# Patient Record
Sex: Male | Born: 1970 | Race: Black or African American | Hispanic: No | Marital: Single | Smoking: Never smoker
Health system: Southern US, Community
[De-identification: ages and names within clinical notes are randomized; demographics above are authoritative.]

## PROBLEM LIST (undated history)

## (undated) DIAGNOSIS — I639 Cerebral infarction, unspecified: Secondary | ICD-10-CM

## (undated) DIAGNOSIS — R42 Dizziness and giddiness: Secondary | ICD-10-CM

## (undated) DIAGNOSIS — F32A Depression, unspecified: Secondary | ICD-10-CM

## (undated) HISTORY — DX: Cerebral infarction, unspecified: I63.9

---

## 2000-11-30 HISTORY — PX: VASECTOMY: SHX75

## 2019-09-14 ENCOUNTER — Other Ambulatory Visit: Payer: Self-pay

## 2019-09-14 DIAGNOSIS — Z20822 Contact with and (suspected) exposure to covid-19: Secondary | ICD-10-CM

## 2019-09-15 LAB — NOVEL CORONAVIRUS, NAA: SARS-CoV-2, NAA: NOT DETECTED

## 2019-09-18 ENCOUNTER — Telehealth: Payer: Self-pay | Admitting: General Practice

## 2019-09-18 NOTE — Telephone Encounter (Signed)
Negative COVID results given. Patient results "NOT Detected." Caller expressed understanding. ° °

## 2021-01-28 DIAGNOSIS — I2699 Other pulmonary embolism without acute cor pulmonale: Secondary | ICD-10-CM

## 2021-01-28 HISTORY — DX: Other pulmonary embolism without acute cor pulmonale: I26.99

## 2021-02-12 ENCOUNTER — Other Ambulatory Visit: Payer: Self-pay

## 2021-02-12 ENCOUNTER — Inpatient Hospital Stay (HOSPITAL_COMMUNITY)
Admission: EM | Admit: 2021-02-12 | Discharge: 2021-02-15 | DRG: 208 | Disposition: A | Discharge status: Home / Self Care | Payer: 59 | Attending: Pulmonary Disease | Admitting: Pulmonary Disease

## 2021-02-12 ENCOUNTER — Emergency Department (HOSPITAL_COMMUNITY): Payer: 59

## 2021-02-12 ENCOUNTER — Encounter (HOSPITAL_COMMUNITY): Payer: Self-pay

## 2021-02-12 DIAGNOSIS — I82442 Acute embolism and thrombosis of left tibial vein: Secondary | ICD-10-CM | POA: Diagnosis present

## 2021-02-12 DIAGNOSIS — R296 Repeated falls: Secondary | ICD-10-CM | POA: Diagnosis present

## 2021-02-12 DIAGNOSIS — I614 Nontraumatic intracerebral hemorrhage in cerebellum: Secondary | ICD-10-CM | POA: Diagnosis present

## 2021-02-12 DIAGNOSIS — S0003XA Contusion of scalp, initial encounter: Secondary | ICD-10-CM | POA: Diagnosis present

## 2021-02-12 DIAGNOSIS — R52 Pain, unspecified: Secondary | ICD-10-CM

## 2021-02-12 DIAGNOSIS — D696 Thrombocytopenia, unspecified: Secondary | ICD-10-CM | POA: Diagnosis present

## 2021-02-12 DIAGNOSIS — I2609 Other pulmonary embolism with acute cor pulmonale: Secondary | ICD-10-CM

## 2021-02-12 DIAGNOSIS — R55 Syncope and collapse: Secondary | ICD-10-CM | POA: Diagnosis not present

## 2021-02-12 DIAGNOSIS — Z20822 Contact with and (suspected) exposure to covid-19: Secondary | ICD-10-CM | POA: Diagnosis present

## 2021-02-12 DIAGNOSIS — M79605 Pain in left leg: Secondary | ICD-10-CM | POA: Diagnosis present

## 2021-02-12 DIAGNOSIS — I82621 Acute embolism and thrombosis of deep veins of right upper extremity: Secondary | ICD-10-CM | POA: Diagnosis present

## 2021-02-12 DIAGNOSIS — F10239 Alcohol dependence with withdrawal, unspecified: Secondary | ICD-10-CM | POA: Diagnosis not present

## 2021-02-12 DIAGNOSIS — F10229 Alcohol dependence with intoxication, unspecified: Secondary | ICD-10-CM | POA: Diagnosis present

## 2021-02-12 DIAGNOSIS — Z6832 Body mass index (BMI) 32.0-32.9, adult: Secondary | ICD-10-CM

## 2021-02-12 DIAGNOSIS — F4310 Post-traumatic stress disorder, unspecified: Secondary | ICD-10-CM | POA: Diagnosis present

## 2021-02-12 DIAGNOSIS — N62 Hypertrophy of breast: Secondary | ICD-10-CM | POA: Diagnosis present

## 2021-02-12 DIAGNOSIS — G9340 Encephalopathy, unspecified: Secondary | ICD-10-CM | POA: Diagnosis not present

## 2021-02-12 DIAGNOSIS — F17220 Nicotine dependence, chewing tobacco, uncomplicated: Secondary | ICD-10-CM | POA: Diagnosis present

## 2021-02-12 DIAGNOSIS — E876 Hypokalemia: Secondary | ICD-10-CM | POA: Diagnosis not present

## 2021-02-12 DIAGNOSIS — G40901 Epilepsy, unspecified, not intractable, with status epilepticus: Secondary | ICD-10-CM | POA: Diagnosis present

## 2021-02-12 DIAGNOSIS — R0902 Hypoxemia: Secondary | ICD-10-CM | POA: Diagnosis present

## 2021-02-12 DIAGNOSIS — K5900 Constipation, unspecified: Secondary | ICD-10-CM | POA: Diagnosis not present

## 2021-02-12 DIAGNOSIS — X58XXXA Exposure to other specified factors, initial encounter: Secondary | ICD-10-CM | POA: Diagnosis present

## 2021-02-12 DIAGNOSIS — I82412 Acute embolism and thrombosis of left femoral vein: Secondary | ICD-10-CM | POA: Diagnosis present

## 2021-02-12 DIAGNOSIS — J9601 Acute respiratory failure with hypoxia: Secondary | ICD-10-CM | POA: Diagnosis present

## 2021-02-12 DIAGNOSIS — F1010 Alcohol abuse, uncomplicated: Secondary | ICD-10-CM | POA: Diagnosis present

## 2021-02-12 DIAGNOSIS — E872 Acidosis: Secondary | ICD-10-CM | POA: Diagnosis present

## 2021-02-12 DIAGNOSIS — R410 Disorientation, unspecified: Secondary | ICD-10-CM | POA: Diagnosis not present

## 2021-02-12 DIAGNOSIS — F32A Depression, unspecified: Secondary | ICD-10-CM | POA: Diagnosis present

## 2021-02-12 DIAGNOSIS — I959 Hypotension, unspecified: Secondary | ICD-10-CM | POA: Diagnosis present

## 2021-02-12 DIAGNOSIS — I2699 Other pulmonary embolism without acute cor pulmonale: Secondary | ICD-10-CM | POA: Diagnosis present

## 2021-02-12 DIAGNOSIS — J9602 Acute respiratory failure with hypercapnia: Secondary | ICD-10-CM | POA: Diagnosis present

## 2021-02-12 HISTORY — DX: Depression, unspecified: F32.A

## 2021-02-12 LAB — BLOOD GAS, VENOUS
Acid-base deficit: 2.6 mmol/L — ABNORMAL HIGH (ref 0.0–2.0)
Bicarbonate: 23.6 mmol/L (ref 20.0–28.0)
O2 Saturation: 47.3 %
Patient temperature: 98.6
pCO2, Ven: 47.7 mmHg (ref 44.0–60.0)
pH, Ven: 7.316 (ref 7.250–7.430)
pO2, Ven: 33.2 mmHg (ref 32.0–45.0)

## 2021-02-12 LAB — BASIC METABOLIC PANEL
Anion gap: 17 — ABNORMAL HIGH (ref 5–15)
BUN: 11 mg/dL (ref 6–20)
CO2: 20 mmol/L — ABNORMAL LOW (ref 22–32)
Calcium: 8.3 mg/dL — ABNORMAL LOW (ref 8.9–10.3)
Chloride: 101 mmol/L (ref 98–111)
Creatinine, Ser: 1.32 mg/dL — ABNORMAL HIGH (ref 0.61–1.24)
GFR, Estimated: >60 mL/min (ref >60)
Glucose, Bld: 86 mg/dL (ref 70–99)
Potassium: 3.3 mmol/L — ABNORMAL LOW (ref 3.5–5.1)
Sodium: 138 mmol/L (ref 135–145)

## 2021-02-12 LAB — CBC
HCT: 51.2 % (ref 39.0–52.0)
Hemoglobin: 16.8 g/dL (ref 13.0–17.0)
MCH: 33.5 pg (ref 26.0–34.0)
MCHC: 32.8 g/dL (ref 30.0–36.0)
MCV: 102 fL — ABNORMAL HIGH (ref 80.0–100.0)
Platelets: 133 10*3/uL — ABNORMAL LOW (ref 150–400)
RBC: 5.02 MIL/uL (ref 4.22–5.81)
RDW: 15.1 % (ref 11.5–15.5)
WBC: 12.4 10*3/uL — ABNORMAL HIGH (ref 4.0–10.5)
nRBC: 0 % (ref 0.0–0.2)

## 2021-02-12 LAB — TSH: TSH: 2.88 u[IU]/mL (ref 0.350–4.500)

## 2021-02-12 LAB — RESP PANEL BY RT-PCR (FLU A&B, COVID) ARPGX2
Influenza A by PCR: NEGATIVE
Influenza B by PCR: NEGATIVE
SARS Coronavirus 2 by RT PCR: NEGATIVE

## 2021-02-12 LAB — LIPASE, BLOOD: Lipase: 26 U/L (ref 11–51)

## 2021-02-12 LAB — HEPATIC FUNCTION PANEL
ALT: 25 U/L (ref 0–44)
AST: 80 U/L — ABNORMAL HIGH (ref 15–41)
Albumin: 2.2 g/dL — ABNORMAL LOW (ref 3.5–5.0)
Alkaline Phosphatase: 26 U/L — ABNORMAL LOW (ref 38–126)
Bilirubin, Direct: 0.5 mg/dL — ABNORMAL HIGH (ref 0.0–0.2)
Indirect Bilirubin: 0.6 mg/dL (ref 0.3–0.9)
Total Bilirubin: 1.1 mg/dL (ref 0.3–1.2)
Total Protein: 5 g/dL — ABNORMAL LOW (ref 6.5–8.1)

## 2021-02-12 LAB — MAGNESIUM: Magnesium: 1.5 mg/dL — ABNORMAL LOW (ref 1.7–2.4)

## 2021-02-12 LAB — LACTIC ACID, PLASMA: Lactic Acid, Venous: 5.6 mmol/L (ref 0.5–1.9)

## 2021-02-12 LAB — CBG MONITORING, ED: Glucose-Capillary: 76 mg/dL (ref 70–99)

## 2021-02-12 LAB — TROPONIN I (HIGH SENSITIVITY): Troponin I (High Sensitivity): 108 ng/L (ref <18)

## 2021-02-12 LAB — ETHANOL: Alcohol, Ethyl (B): 54 mg/dL — ABNORMAL HIGH (ref <10)

## 2021-02-12 MED ORDER — HEPARIN (PORCINE) 25000 UT/250ML-% IV SOLN
1600.0000 [IU]/h | INTRAVENOUS | Status: DC
  Administered 2021-02-13: 1600 [IU]/h via INTRAVENOUS
  Filled 2021-02-12: qty 250

## 2021-02-12 MED ORDER — HEPARIN BOLUS VIA INFUSION
2500.0000 [IU] | Freq: Once | INTRAVENOUS | Status: AC
  Administered 2021-02-13: 2500 [IU] via INTRAVENOUS
  Filled 2021-02-12: qty 2500

## 2021-02-12 MED ORDER — LACTATED RINGERS IV BOLUS
1000.0000 mL | Freq: Once | INTRAVENOUS | Status: AC
  Administered 2021-02-12: 1000 mL via INTRAVENOUS

## 2021-02-12 MED ORDER — IOHEXOL 350 MG/ML SOLN
75.0000 mL | Freq: Once | INTRAVENOUS | Status: AC | PRN
  Administered 2021-02-12: 75 mL via INTRAVENOUS

## 2021-02-12 MED ORDER — LACTATED RINGERS IV BOLUS
1000.0000 mL | Freq: Once | INTRAVENOUS | Status: AC
  Administered 2021-02-13: 1000 mL via INTRAVENOUS

## 2021-02-12 NOTE — Progress Notes (Signed)
ANTICOAGULATION CONSULT NOTE - Initial Consult  Pharmacy Consult for Heparin Indication: pulmonary embolus  No Known Allergies  Patient Measurements: Height: 5\' 9"  (175.3 cm) Weight: 97.5 kg (215 lb) IBW/kg (Calculated) : 70.7 HEPARIN DW (KG): 91.1   Vital Signs: Temp: 97.9 F (36.6 C) (03/16 2149) Temp Source: Oral (03/16 2149) BP: 93/66 (03/16 2245) Pulse Rate: 119 (03/16 2245)  Labs: Recent Labs    02/12/21 2149 02/12/21 2200 02/12/21 2230  HGB 16.8  --   --   HCT 51.2  --   --   PLT 133*  --   --   CREATININE  --   --  1.32*  TROPONINIHS  --  108*  --     Estimated Creatinine Clearance: 77.9 mL/min (A) (by C-G formula based on SCr of 1.32 mg/dL (H)).   Medical History: Past Medical History:  Diagnosis Date  . Depression     Medications:  Infusions:  . lactated ringers      Assessment: 50 yo M who presents after syncopal episodes.  No hx VTE and not on anticoagulation PTA.  CTa + PE with evidence of right heart strain. CBC: Hg WNL, Pltc slightly low at 133  Goal of Therapy:  Heparin level 0.3-0.7 units/ml Monitor platelets by anticoagulation protocol: Yes   Plan:  Heparin 2500 units IV bolus x1 followed by continuous infusion at 1600 units/hr Check 6h heparin level after infusion starts Daily heparin level & CBC while on heparin Monitor closely for bleeding  54 PharmD 02/12/2021,11:43 PM

## 2021-02-12 NOTE — ED Triage Notes (Addendum)
Patient had a syncope episode for call by son to EMS in a five minute time span patient called ago that patient was having seizure. Patient was in the bathroom washing his hair. Tremors on baseline. Patient was 84/58 and then gave fluids. Last pressure 124/88 hr-110. Patient put on nonbreather due to 84 O2 Sat.

## 2021-02-12 NOTE — H&P (Signed)
NAME:  Danny Lowe, MRN:  [id number], DOB:  1971/01/02, LOS: 0 ADMISSION DATE:  02/12/2021, CONSULTATION DATE:  02/13/21 REFERRING MD:  Tegeler, CHIEF COMPLAINT:  syncope  Brief History   49yM with borderline massive PE s/p 50 mg tpa through PIV 02/13/21  History of present illness   49yM with alcohol use disorder (2 40oz beers or 12 pack a day - no reported history of severe withdrawal symptoms), PTSD, depression who presents to ED with 3 syncopal episodes earlier the day of admission. He had first noticed LLE swelling in 09/2020 when he had trouble getting a pair of pants on but this has worsened over the last week and has had some pain at that site as well. Today had 3 syncopal episodes and during at least one of them hit his face on the floor. Son found him and observed some shaking movements. He was found to be hypotesnive with SBP in 80s, SpO2 in 80s by EMS. He was given 1L IVF.  In the ED he mentions that he'd recently had some nausea and diarrhea over the last week. He was started on heparin gtt, given an additional 1L IVF.   Past Medical History  Alcohol use disorder PTSD Depression  Significant Hospital Events   02/13/21 50 mg tpa infused  Consults:  PCCM  Procedures:  None  Significant Diagnostic Tests:  CTA Chest   Micro Data:  covid-19 neg  Antimicrobials:  None  Interim history/subjective:  n/a  Objective   Blood pressure 93/66, pulse (!) 119, temperature 97.9 F (36.6 C), temperature source Oral, resp. rate (!) 30, height 5\' 9"  (1.753 m), weight 97.5 kg, SpO2 92 %.       No intake or output data in the 24 hours ending 02/12/21 2351 Filed Weights   02/12/21 2153  Weight: 97.5 kg    Examination: General: alert/oriented x3 HENT: NCAT, dry MM Lungs: CTAB, normal work of breathing Cardiovascular: RRR, no murmur, no JVD  Abdomen: soft, nontender, normal bowel sounds Extremities: warm, well-perfused without cyanosis, edema Neuro: grossly nonfocal,  follows commands  Resolved Hospital Problem list   n/a  Assessment & Plan:   # Massive PE: borderline massive vs high risk/submassive PE. His history raised some concern for component of hypovolemia contributing to his hypotension however on my bedside 2154 his IVC was distended and RV enlarged with systolic and diastolic septal flattening and there was less BP response to second liter of fluid. - after discussion of risks/benefits of peripheral infusion of reduced dose tpa (as used in MOPETT, Wang 2010) vs catheter directed thrombolysis, including the possibility of a slightly higher bleeding risk with even reduced dose tpa than catheter directed lytics, pt elects to proceed with peripheral tpa, preferring to avoid procedures if possible.  - 10 mg bolus of tpa followed by 40 mg over 2 hours given in ED. Reduced dose in setting of mild thrombocytopenia and head trauma causing scalp contusion - resume heparin gtt when ptt <1.5x normal without bolus  - TTE  - 2011 DVT ordered  # Lactic acidosis: - likely driven by sympathetic activation in setting PE vs possibly hypoperfusion related to PE - trend  # Alcohol use disorder:  - ciwa ok for now since there is no history of severe withdrawal manifestations - thiamine   Best practice:  Diet: NPO for now Pain/Anxiety/Delirium protocol (if indicated): ciwa VAP protocol (if indicated): no DVT prophylaxis: no GI prophylaxis: no Glucose control: checking with chemistries Mobility: restricted to bed Code  Status: full Family Communication: pt updated at bedside Disposition: [facility] ICU  Labs   CBC: Recent Labs  Lab 02/12/21 2149  WBC 12.4*  HGB 16.8  HCT 51.2  MCV 102.0*  PLT 133*    Basic Metabolic Panel: Recent Labs  Lab 02/12/21 2200 02/12/21 2230  NA  --  138  K  --  3.3*  CL  --  101  CO2  --  20*  GLUCOSE  --  86  BUN  --  11  CREATININE  --  1.32*  CALCIUM  --  8.3*  MG 1.5*  --    GFR: Estimated Creatinine Clearance:  77.9 mL/min (A) (by C-G formula based on SCr of 1.32 mg/dL (H)). Recent Labs  Lab 02/12/21 2149 02/12/21 2223  WBC 12.4*  --   LATICACIDVEN  --  5.6*    Liver Function Tests: Recent Labs  Lab 02/12/21 2200  AST 80*  ALT 25  ALKPHOS 26*  BILITOT 1.1  PROT 5.0*  ALBUMIN 2.2*   Recent Labs  Lab 02/12/21 2200  LIPASE 26   No results for input(s): AMMONIA in the last 168 hours.  ABG    Component Value Date/Time   HCO3 23.6 02/12/2021 2223   ACIDBASEDEF 2.6 (H) 02/12/2021 2223   O2SAT 47.3 02/12/2021 2223     Coagulation Profile: No results for input(s): INR, PROTIME in the last 168 hours.  Cardiac Enzymes: No results for input(s): CKTOTAL, CKMB, CKMBINDEX, TROPONINI in the last 168 hours.  HbA1C: No results found for: HGBA1C  CBG: Recent Labs  Lab 02/12/21 2150  GLUCAP 76    Review of Systems:   A twelve point review of systems is negative except as otherwise noted in HPI  Past Medical History  He,  has a past medical history of Depression.   Surgical History   History reviewed. No pertinent surgical history.   Social History   reports that he has never smoked. His smokeless tobacco use includes chew. He reports current alcohol use. He reports that he does not use drugs.   Family History   His family history is not on file.   Allergies No Known Allergies   Home Medications  Prior to Admission medications   Not on File     Critical care time: 45 minutes    This patient is critically ill with massive PE; which, requires frequent high complexity decision making, assessment, support, evaluation, and titration of therapies. This was completed through the application of advanced monitoring technologies and extensive interpretation of multiple databases. During this encounter critical care time was devoted to patient care services described in this note for 45 minutes.  Vida Roller, Pulmonary/Critical Care

## 2021-02-12 NOTE — Progress Notes (Incomplete)
ANTICOAGULATION CONSULT NOTE - Initial Consult  Pharmacy Consult for Heparin Indication: pulmonary embolus  No Known Allergies  Patient Measurements: Height: 5\' 9"  (175.3 cm) Weight: 97.5 kg (215 lb) IBW/kg (Calculated) : 70.7 HEPARIN DW (KG): 91.1   Vital Signs: Temp: 97.9 F (36.6 C) (03/16 2149) Temp Source: Oral (03/16 2149) BP: 93/66 (03/16 2245) Pulse Rate: 119 (03/16 2245)  Labs: Recent Labs    02/12/21 2149 02/12/21 2200 02/12/21 2230  HGB 16.8  --   --   HCT 51.2  --   --   PLT 133*  --   --   CREATININE  --   --  1.32*  TROPONINIHS  --  108*  --     Estimated Creatinine Clearance: 77.9 mL/min (A) (by C-G formula based on SCr of 1.32 mg/dL (H)).   Medical History: Past Medical History:  Diagnosis Date  . Depression     Medications:  Infusions:  . lactated ringers      Assessment: 50 yo M who presents after syncopal episodes.  No hx VTE and not on anticoagulation PTA.  CTa + PE CBC: Hg WNL, Pltc slightly low at 133  Goal of Therapy:  Heparin level 0.3-0.7 units/ml Monitor platelets by anticoagulation protocol: Yes   Plan:  Heparin 2500 units IV bolus x1 followed by continuous infusion at 1600 units/hr Check 6h heparin level after infusion starts Daily heparin level & CBC while on heparin Monitor closely for bleeding  50 PharmD 02/12/2021,11:43 PM

## 2021-02-12 NOTE — ED Provider Notes (Addendum)
[facility]-EMERGENCY DEPT Provider Note   CSN: [id number] Arrival date & time: 02/12/21  2116     History Chief Complaint  Patient presents with  . Near Syncope    Danny Lowe is a 50 y.o. male with history of depression and PTSD who presents for 3 syncopal episodes this evening. He states that he stood up and walked 3 steps when he felt lightheaded and passed out, hitting his face on the floor. States his son told him he had shaking movements. Denies any history of seizures or of passing out before tonight. He woke and got up but then had another syncopal episode. His son called EMS who came and checked orthostatics, they recommended transport to the hospital but they refused. After EMS left he had a third syncopal episode and they called EMS again, after which they agreed to transport. EMS reports he had BP with systolic in the 80s and also O2 saturation in the 80s. They administered 1L of IV fluids, and patient states he has felt much better since then.  Patient reports that over the past week, he has had swelling and pain in the L leg. This has improved somewhat over time. No known history of DVT or PE. He has also had intermittent SOB, chest tigthness, and cough productive of small amounts of blood. No history of smoking. He has had heavy alcohol use in the past several months and notes he is going through a separation with his significant other which is causing depressed mood. Reports typically having 12 beers a day. He has had 2x 40oz beers today with the last drink at 1930. Also notes poor PO intake of both food and fluid, attributes this to depression.    Past Medical History:  Diagnosis Date  . Depression    There are no problems to display for this patient.  History reviewed. No pertinent surgical history.   History reviewed. No pertinent family history.  Social History   Tobacco Use  . Smoking status: Never Smoker  . Smokeless tobacco: Current  User    Types: Chew  Substance Use Topics  . Alcohol use: Yes    Comment: 12 beers daily  . Drug use: Never    Home Medications Prior to Admission medications   Not on File  Trazadone Not taking prescribed sertraline  Allergies    Patient has no known allergies.  Review of Systems   Review of Systems  Constitutional: Positive for appetite change (decreased food and fluid intake). Negative for chills and fever.  HENT: Negative for congestion and sore throat.   Eyes: Negative for pain and visual disturbance.  Respiratory: Positive for cough (productive of specks of blood), chest tightness and shortness of breath.   Cardiovascular: Positive for leg swelling. Negative for palpitations.  Gastrointestinal: Positive for nausea and vomiting (dry heave x1). Negative for abdominal pain.  Genitourinary: Negative for dysuria and frequency.  Musculoskeletal: Negative for arthralgias and back pain.  Skin: Negative for rash and wound.  Neurological: Positive for syncope, light-headedness and numbness (numbness of his L thumb and index finger when he does not eat). Negative for weakness and headaches.  Psychiatric/Behavioral: Positive for dysphoric mood and sleep disturbance.  All other systems reviewed and are negative.   Physical Exam Updated Vital Signs BP 93/66   Pulse (!) 119   Temp 97.9 F (36.6 C) (Oral)   Resp (!) 30   Ht 5\' 9"  (1.753 m)   Wt 97.5 kg   SpO2 92%  BMI 31.75 kg/m   Physical Exam Vitals and nursing note reviewed.  Constitutional:      General: He is not in acute distress.    Comments: Appears mildly fatigued  HENT:     Head: Normocephalic and atraumatic.     Right Ear: External ear normal.     Left Ear: External ear normal.     Nose: Nose normal.     Mouth/Throat:     Mouth: Mucous membranes are moist.     Pharynx: Oropharynx is clear.     Comments: Abrasion and swelling to upper lip Eyes:     Extraocular Movements: Extraocular movements intact.      Pupils: Pupils are equal, round, and reactive to light.  Cardiovascular:     Rate and Rhythm: Regular rhythm. Tachycardia present.     Heart sounds: No murmur heard. No friction rub. No gallop.      Comments: Extremities are cool to the touch 2+ radial and DP pulses bilaterally LLE with 1+ pitting edema to the ankle  Negative Homan's sign, no calf tenderness Moderate JVD  Pulmonary:     Effort: Pulmonary effort is normal. No respiratory distress.     Breath sounds: Normal breath sounds. No stridor. No wheezing or rhonchi.  Abdominal:     General: Abdomen is flat. There is no distension.     Palpations: Abdomen is soft.     Tenderness: There is no abdominal tenderness.  Musculoskeletal:        General: No deformity or signs of injury.     Cervical back: Normal range of motion and neck supple.     Right lower leg: No edema.     Left lower leg: Edema present.  Skin:    General: Skin is dry.  Neurological:     General: No focal deficit present.     Mental Status: He is alert and oriented to person, place, and time.  Psychiatric:        Mood and Affect: Mood normal.        Behavior: Behavior normal.     ED Results / Procedures / Treatments   Labs (all labs ordered are listed, but only abnormal results are displayed) Labs Reviewed  CBC - Abnormal; Notable for the following components:      Result Value   WBC 12.4 (*)    MCV 102.0 (*)    Platelets 133 (*)    All other components within normal limits  ETHANOL - Abnormal; Notable for the following components:   Alcohol, Ethyl (B) 54 (*)    All other components within normal limits  HEPATIC FUNCTION PANEL - Abnormal; Notable for the following components:   Total Protein 5.0 (*)    Albumin 2.2 (*)    AST 80 (*)    Alkaline Phosphatase 26 (*)    Bilirubin, Direct 0.5 (*)    All other components within normal limits  LACTIC ACID, PLASMA - Abnormal; Notable for the following components:   Lactic Acid, Venous 5.6 (*)    All  other components within normal limits  BLOOD GAS, VENOUS - Abnormal; Notable for the following components:   Acid-base deficit 2.6 (*)    All other components within normal limits  MAGNESIUM - Abnormal; Notable for the following components:   Magnesium 1.5 (*)    All other components within normal limits  BASIC METABOLIC PANEL - Abnormal; Notable for the following components:   Potassium 3.3 (*)    CO2 20 (*)  Creatinine, Ser 1.32 (*)    Calcium 8.3 (*)    Anion gap 17 (*)    All other components within normal limits  TROPONIN I (HIGH SENSITIVITY) - Abnormal; Notable for the following components:   Troponin I (High Sensitivity) 108 (*)    All other components within normal limits  RESP PANEL BY RT-PCR (FLU A&B, COVID) ARPGX2  LIPASE, BLOOD  TSH  URINALYSIS, ROUTINE W REFLEX MICROSCOPIC  BRAIN NATRIURETIC PEPTIDE  RAPID URINE DRUG SCREEN, HOSP PERFORMED  LACTIC ACID, PLASMA  CBC  DIC (DISSEMINATED INTRAVASCULAR COAGULATION)PANEL  CBG MONITORING, ED  TROPONIN I (HIGH SENSITIVITY)   EKG EKG Interpretation  Date/Time:  Wednesday February 12 2021 21:41:40 EDT Ventricular Rate:  122 PR Interval:    QRS Duration: 83 QT Interval:  323 QTC Calculation: 461 R Axis:   87 Text Interpretation: Sinus tachycardia Anterior infarct, age indeterminate No prior ECG for comparison. No STEMI Confirmed by Theda Belfast ([id number]) on 02/12/2021 9:53:03 PM  Radiology No results found.  Procedures Procedures   Medications Ordered in ED Medications  lactated ringers bolus 1,000 mL (has no administration in time range)  heparin bolus via infusion 2,500 Units (has no administration in time range)    Followed by  heparin ADULT infusion 100 units/mL (25000 units/275mL) (has no administration in time range)  lactated ringers bolus 1,000 mL (1,000 mLs Intravenous New Bag/Given 02/12/21 2159)  iohexol (OMNIPAQUE) 350 MG/ML injection 75 mL (75 mLs Intravenous Contrast Given 02/12/21 2327)    ED  Course  I have reviewed the triage vital signs and the nursing notes.  Pertinent labs & imaging results that were available during my care of the patient were reviewed by me and considered in my medical decision making (see chart for details).    MDM Rules/Calculators/A&P                         Presenting symptoms were highly concerning for PE and history of recent leg swelling, cough productive of blood, tachycardia, syncope, and hypotension. CTA demonstrated very large bilateral pulmonary emboli with evidence of right heart strain, as well as another possible clot in the IVC. Troponin 108 consistent with R heart strain. Spoke with radiology about his scan, formal read is pending. Started on heparin.   He was initially hypotensive with systolic BP in 80s and O2 sat in 80s per EMS. Since arrival here has had improvement in BP, now in 90s/60s so soft but stable. Saturation of mid 90s on room air. 1L LR bolus administered and another is infusing. Lactic acid elevated and meets SIRS criteria with tachycardia, leukocytosis, tachypnea but holding off on antibiotics given low suspicion for infection.  Ordered CT scans of head and face as well given history of fall with evidence of facial trauma. Scans are pending.  Ethanol elevated at 54, patient reports significant alcohol use. CIWA protocol q4hr ordered. AST 80 and ALT 25, albumin low at 2.2.  Labs also significant for creatinine of 1.32, no prior in our system for comparison but no known history of renal disease. Unfortunately he required contrast for his CTA, so expect further uptrend in creatinine.   In summary, he is receiving IV fluids and heparin for his pulmonary embolism and right heart strain. BP stable but soft. Formal read of CT scans and CTA pending. CIWA ordered for significant alcohol use. Spoke with ICU provider who will evaluate patient for admission.  Final Clinical Impression(s) / ED Diagnoses Final diagnoses:  Syncope and  collapse  Hypotension, unspecified hypotension type  Acute pulmonary embolism with acute cor pulmonale, unspecified pulmonary embolism type [facility](HCC)    Rx / DC Orders ED Discharge Orders    None       Remo Lippshen, Bani Gianfrancesco Y, MD 02/13/21 0000    Remo Lippshen, Glendine Swetz Y, MD 02/13/21 0002    Tegeler, Canary Brimhristopher J, MD 02/13/21 [phone]

## 2021-02-13 ENCOUNTER — Encounter (HOSPITAL_COMMUNITY): Payer: Self-pay | Admitting: Student

## 2021-02-13 ENCOUNTER — Inpatient Hospital Stay (HOSPITAL_COMMUNITY): Payer: 59

## 2021-02-13 DIAGNOSIS — I2609 Other pulmonary embolism with acute cor pulmonale: Secondary | ICD-10-CM | POA: Diagnosis present

## 2021-02-13 DIAGNOSIS — I616 Nontraumatic intracerebral hemorrhage, multiple localized: Secondary | ICD-10-CM | POA: Diagnosis not present

## 2021-02-13 DIAGNOSIS — X58XXXA Exposure to other specified factors, initial encounter: Secondary | ICD-10-CM | POA: Diagnosis present

## 2021-02-13 DIAGNOSIS — N62 Hypertrophy of breast: Secondary | ICD-10-CM | POA: Diagnosis present

## 2021-02-13 DIAGNOSIS — F10239 Alcohol dependence with withdrawal, unspecified: Secondary | ICD-10-CM | POA: Diagnosis not present

## 2021-02-13 DIAGNOSIS — G9340 Encephalopathy, unspecified: Secondary | ICD-10-CM | POA: Diagnosis not present

## 2021-02-13 DIAGNOSIS — F4310 Post-traumatic stress disorder, unspecified: Secondary | ICD-10-CM | POA: Diagnosis present

## 2021-02-13 DIAGNOSIS — Z20822 Contact with and (suspected) exposure to covid-19: Secondary | ICD-10-CM | POA: Diagnosis present

## 2021-02-13 DIAGNOSIS — R296 Repeated falls: Secondary | ICD-10-CM | POA: Diagnosis present

## 2021-02-13 DIAGNOSIS — F17220 Nicotine dependence, chewing tobacco, uncomplicated: Secondary | ICD-10-CM | POA: Diagnosis present

## 2021-02-13 DIAGNOSIS — G40901 Epilepsy, unspecified, not intractable, with status epilepticus: Secondary | ICD-10-CM | POA: Diagnosis not present

## 2021-02-13 DIAGNOSIS — M79605 Pain in left leg: Secondary | ICD-10-CM | POA: Diagnosis present

## 2021-02-13 DIAGNOSIS — I959 Hypotension, unspecified: Secondary | ICD-10-CM | POA: Diagnosis present

## 2021-02-13 DIAGNOSIS — K5900 Constipation, unspecified: Secondary | ICD-10-CM | POA: Diagnosis not present

## 2021-02-13 DIAGNOSIS — I82621 Acute embolism and thrombosis of deep veins of right upper extremity: Secondary | ICD-10-CM | POA: Diagnosis present

## 2021-02-13 DIAGNOSIS — E876 Hypokalemia: Secondary | ICD-10-CM | POA: Diagnosis not present

## 2021-02-13 DIAGNOSIS — E872 Acidosis: Secondary | ICD-10-CM | POA: Diagnosis present

## 2021-02-13 DIAGNOSIS — I2602 Saddle embolus of pulmonary artery with acute cor pulmonale: Secondary | ICD-10-CM | POA: Diagnosis not present

## 2021-02-13 DIAGNOSIS — R55 Syncope and collapse: Secondary | ICD-10-CM

## 2021-02-13 DIAGNOSIS — R569 Unspecified convulsions: Secondary | ICD-10-CM | POA: Diagnosis not present

## 2021-02-13 DIAGNOSIS — J9602 Acute respiratory failure with hypercapnia: Secondary | ICD-10-CM | POA: Diagnosis present

## 2021-02-13 DIAGNOSIS — I2699 Other pulmonary embolism without acute cor pulmonale: Secondary | ICD-10-CM | POA: Diagnosis present

## 2021-02-13 DIAGNOSIS — D696 Thrombocytopenia, unspecified: Secondary | ICD-10-CM | POA: Diagnosis present

## 2021-02-13 DIAGNOSIS — I614 Nontraumatic intracerebral hemorrhage in cerebellum: Secondary | ICD-10-CM | POA: Diagnosis present

## 2021-02-13 DIAGNOSIS — I824Y9 Acute embolism and thrombosis of unspecified deep veins of unspecified proximal lower extremity: Secondary | ICD-10-CM | POA: Diagnosis not present

## 2021-02-13 DIAGNOSIS — I82412 Acute embolism and thrombosis of left femoral vein: Secondary | ICD-10-CM | POA: Diagnosis present

## 2021-02-13 DIAGNOSIS — R0602 Shortness of breath: Secondary | ICD-10-CM | POA: Diagnosis not present

## 2021-02-13 DIAGNOSIS — S0003XA Contusion of scalp, initial encounter: Secondary | ICD-10-CM | POA: Diagnosis present

## 2021-02-13 DIAGNOSIS — J9690 Respiratory failure, unspecified, unspecified whether with hypoxia or hypercapnia: Secondary | ICD-10-CM | POA: Diagnosis not present

## 2021-02-13 DIAGNOSIS — F32A Depression, unspecified: Secondary | ICD-10-CM | POA: Diagnosis present

## 2021-02-13 DIAGNOSIS — M79603 Pain in arm, unspecified: Secondary | ICD-10-CM | POA: Diagnosis not present

## 2021-02-13 DIAGNOSIS — I619 Nontraumatic intracerebral hemorrhage, unspecified: Secondary | ICD-10-CM | POA: Diagnosis not present

## 2021-02-13 DIAGNOSIS — I82442 Acute embolism and thrombosis of left tibial vein: Secondary | ICD-10-CM | POA: Diagnosis present

## 2021-02-13 DIAGNOSIS — I82433 Acute embolism and thrombosis of popliteal vein, bilateral: Secondary | ICD-10-CM | POA: Diagnosis not present

## 2021-02-13 DIAGNOSIS — I82439 Acute embolism and thrombosis of unspecified popliteal vein: Secondary | ICD-10-CM | POA: Diagnosis not present

## 2021-02-13 DIAGNOSIS — J9601 Acute respiratory failure with hypoxia: Secondary | ICD-10-CM | POA: Diagnosis present

## 2021-02-13 LAB — CBC
HCT: 45.9 % (ref 39.0–52.0)
HCT: 46.4 % (ref 39.0–52.0)
Hemoglobin: 15.4 g/dL (ref 13.0–17.0)
Hemoglobin: 15.9 g/dL (ref 13.0–17.0)
MCH: 32.9 pg (ref 26.0–34.0)
MCH: 33.8 pg (ref 26.0–34.0)
MCHC: 33.6 g/dL (ref 30.0–36.0)
MCHC: 34.3 g/dL (ref 30.0–36.0)
MCV: 98.1 fL (ref 80.0–100.0)
MCV: 98.7 fL (ref 80.0–100.0)
Platelets: 149 10*3/uL — ABNORMAL LOW (ref 150–400)
Platelets: 133 10*3/uL — ABNORMAL LOW (ref 150–400)
RBC: 4.68 MIL/uL (ref 4.22–5.81)
RBC: 4.7 MIL/uL (ref 4.22–5.81)
RDW: 15 % (ref 11.5–15.5)
RDW: 15.2 % (ref 11.5–15.5)
WBC: 9.2 10*3/uL (ref 4.0–10.5)
WBC: 8.7 10*3/uL (ref 4.0–10.5)
nRBC: 0 % (ref 0.0–0.2)
nRBC: 0 % (ref 0.0–0.2)

## 2021-02-13 LAB — PROTIME-INR
INR: 1.8 — ABNORMAL HIGH (ref 0.8–1.2)
Prothrombin Time: 20.1 seconds — ABNORMAL HIGH (ref 11.4–15.2)

## 2021-02-13 LAB — ECHOCARDIOGRAM COMPLETE
AR max vel: 3.62 cm2
AV Area VTI: 3.24 cm2
AV Area mean vel: 3.58 cm2
AV Mean grad: 3 mmHg
AV Peak grad: 4.8 mmHg
Ao pk vel: 1.1 m/s
Area-P 1/2: 4.57 cm2
Calc EF: 63.6 %
Height: 69 in
MV VTI: 3.22 cm2
S' Lateral: 2.6 cm
Single Plane A2C EF: 58.8 %
Single Plane A4C EF: 67.7 %
Weight: 3280.44 oz

## 2021-02-13 LAB — DIC (DISSEMINATED INTRAVASCULAR COAGULATION)PANEL
D-Dimer, Quant: >20 ug/mL-FEU — ABNORMAL HIGH (ref 0.00–0.50)
Fibrinogen: 397 mg/dL (ref 210–475)
INR: 1.2 (ref 0.8–1.2)
Platelets: 122 10*3/uL — ABNORMAL LOW (ref 150–400)
Prothrombin Time: 14.7 seconds (ref 11.4–15.2)
Smear Review: NONE SEEN
aPTT: 21 seconds — ABNORMAL LOW (ref 24–36)

## 2021-02-13 LAB — COMPREHENSIVE METABOLIC PANEL
ALT: 27 U/L (ref 0–44)
AST: 48 U/L — ABNORMAL HIGH (ref 15–41)
Albumin: 2.6 g/dL — ABNORMAL LOW (ref 3.5–5.0)
Alkaline Phosphatase: 35 U/L — ABNORMAL LOW (ref 38–126)
Anion gap: 10 (ref 5–15)
BUN: 8 mg/dL (ref 6–20)
CO2: 24 mmol/L (ref 22–32)
Calcium: 8.4 mg/dL — ABNORMAL LOW (ref 8.9–10.3)
Chloride: 102 mmol/L (ref 98–111)
Creatinine, Ser: 1.32 mg/dL — ABNORMAL HIGH (ref 0.61–1.24)
GFR, Estimated: >60 mL/min (ref >60)
Glucose, Bld: 76 mg/dL (ref 70–99)
Potassium: 4.1 mmol/L (ref 3.5–5.1)
Sodium: 136 mmol/L (ref 135–145)
Total Bilirubin: 2 mg/dL — ABNORMAL HIGH (ref 0.3–1.2)
Total Protein: 6.2 g/dL — ABNORMAL LOW (ref 6.5–8.1)

## 2021-02-13 LAB — URINALYSIS, ROUTINE W REFLEX MICROSCOPIC
Bacteria, UA: NONE SEEN
Bilirubin Urine: NEGATIVE
Glucose, UA: NEGATIVE mg/dL
Ketones, ur: 5 mg/dL — AB
Leukocytes,Ua: NEGATIVE
Nitrite: NEGATIVE
Protein, ur: 100 mg/dL — AB
Specific Gravity, Urine: 1.042 — ABNORMAL HIGH (ref 1.005–1.030)
pH: 6 (ref 5.0–8.0)

## 2021-02-13 LAB — LACTIC ACID, PLASMA: Lactic Acid, Venous: 3.9 mmol/L (ref 0.5–1.9)

## 2021-02-13 LAB — RAPID URINE DRUG SCREEN, HOSP PERFORMED
Amphetamines: NOT DETECTED
Barbiturates: NOT DETECTED
Benzodiazepines: NOT DETECTED
Cocaine: NOT DETECTED
Opiates: NOT DETECTED
Tetrahydrocannabinol: POSITIVE — AB

## 2021-02-13 LAB — APTT: aPTT: 48 seconds — ABNORMAL HIGH (ref 24–36)

## 2021-02-13 LAB — CK: Total CK: 241 U/L (ref 49–397)

## 2021-02-13 LAB — HEPARIN LEVEL (UNFRACTIONATED)
Heparin Unfractionated: <0.1 IU/mL — ABNORMAL LOW (ref 0.30–0.70)
Heparin Unfractionated: 0.11 IU/mL — ABNORMAL LOW (ref 0.30–0.70)

## 2021-02-13 LAB — TROPONIN I (HIGH SENSITIVITY): Troponin I (High Sensitivity): 222 ng/L (ref <18)

## 2021-02-13 LAB — HIV ANTIBODY (ROUTINE TESTING W REFLEX): HIV Screen 4th Generation wRfx: NONREACTIVE

## 2021-02-13 LAB — AMMONIA: Ammonia: 40 umol/L — ABNORMAL HIGH (ref 9–35)

## 2021-02-13 LAB — BRAIN NATRIURETIC PEPTIDE: B Natriuretic Peptide: 213.4 pg/mL — ABNORMAL HIGH (ref 0.0–100.0)

## 2021-02-13 LAB — MRSA PCR SCREENING: MRSA by PCR: NEGATIVE

## 2021-02-13 MED ORDER — ALTEPLASE (PULMONARY EMBOLISM) INFUSION
50.0000 mg | Freq: Once | INTRAVENOUS | Status: DC
  Filled 2021-02-13: qty 50

## 2021-02-13 MED ORDER — CHLORHEXIDINE GLUCONATE CLOTH 2 % EX PADS
6.0000 | MEDICATED_PAD | Freq: Every day | CUTANEOUS | Status: DC
  Administered 2021-02-13 – 2021-02-14 (×2): 6 via TOPICAL

## 2021-02-13 MED ORDER — POLYETHYLENE GLYCOL 3350 17 G PO PACK: 17.0000 g | PACK | Freq: Every day | ORAL | Status: DC | PRN

## 2021-02-13 MED ORDER — ADULT MULTIVITAMIN W/MINERALS CH
1.0000 | ORAL_TABLET | Freq: Every day | ORAL | Status: DC
  Administered 2021-02-13 – 2021-02-15 (×3): 1 via ORAL
  Filled 2021-02-13 (×3): qty 1

## 2021-02-13 MED ORDER — DOCUSATE SODIUM 100 MG PO CAPS: 100.0000 mg | ORAL_CAPSULE | Freq: Two times a day (BID) | ORAL | Status: DC | PRN

## 2021-02-13 MED ORDER — THIAMINE HCL 100 MG/ML IJ SOLN
100.0000 mg | Freq: Every day | INTRAMUSCULAR | Status: DC
  Administered 2021-02-13 – 2021-02-14 (×2): 100 mg via INTRAVENOUS
  Filled 2021-02-13 (×2): qty 2

## 2021-02-13 MED ORDER — ALTEPLASE (PULMONARY EMBOLISM) INFUSION
50.0000 mg | Freq: Once | INTRAVENOUS | Status: AC
  Administered 2021-02-13: 50 mg via INTRAVENOUS
  Filled 2021-02-13: qty 50

## 2021-02-13 MED ORDER — MAGNESIUM SULFATE 2 GM/50ML IV SOLN
2.0000 g | Freq: Once | INTRAVENOUS | Status: AC
  Administered 2021-02-13: 2 g via INTRAVENOUS
  Filled 2021-02-13: qty 50

## 2021-02-13 MED ORDER — LORAZEPAM 1 MG PO TABS: 1.0000 mg | ORAL_TABLET | ORAL | Status: DC | PRN

## 2021-02-13 MED ORDER — LORAZEPAM 2 MG/ML IJ SOLN
1.0000 mg | INTRAMUSCULAR | Status: DC | PRN
Start: 2021-02-13 — End: 2021-02-15

## 2021-02-13 MED ORDER — SODIUM CHLORIDE 0.9 % IV SOLN
250.0000 mL | Freq: Once | INTRAVENOUS | Status: AC
  Administered 2021-02-13: 250 mL via INTRAVENOUS
  Filled 2021-02-13: qty 250

## 2021-02-13 MED ORDER — MAGNESIUM SULFATE 2 GM/50ML IV SOLN
INTRAVENOUS | Status: AC
  Filled 2021-02-13: qty 50

## 2021-02-13 MED ORDER — HEPARIN (PORCINE) 25000 UT/250ML-% IV SOLN
1700.0000 [IU]/h | INTRAVENOUS | Status: DC
  Administered 2021-02-13: 1100 [IU]/h via INTRAVENOUS
  Administered 2021-02-14: 1700 [IU]/h via INTRAVENOUS
  Filled 2021-02-13 (×2): qty 250

## 2021-02-13 MED ORDER — FOLIC ACID 1 MG PO TABS
1.0000 mg | ORAL_TABLET | Freq: Every day | ORAL | Status: DC
  Administered 2021-02-13 – 2021-02-15 (×3): 1 mg via ORAL
  Filled 2021-02-13 (×3): qty 1

## 2021-02-13 MED ORDER — OXYCODONE-ACETAMINOPHEN 5-325 MG PO TABS
1.0000 | ORAL_TABLET | Freq: Four times a day (QID) | ORAL | Status: DC | PRN
  Administered 2021-02-13: 1 via ORAL
  Filled 2021-02-13: qty 1

## 2021-02-13 NOTE — Progress Notes (Signed)
VASCULAR LAB    Bilateral lower extremity venous duplex has been performed.  See CV proc for preliminary results.  Gave verbal report to Michail Jewels, RN  Sean Malinowski, RVT 02/13/2021, 9:00 AM

## 2021-02-13 NOTE — ED Notes (Signed)
Pt. Documented in error see above note in chart. 

## 2021-02-13 NOTE — Progress Notes (Signed)
eLink Physician-Brief Progress Note Patient Name: Danny Lowe DOB: [birth year] MRN: [id number]   Date of Service  02/13/2021  HPI/Events of Note  Patient admitted via ED with bilateral extensive PE and imaging suggestion of an IVC clot which is likely the source of the PE, patient's RV meets CT criteria for PE related strain.  eICU Interventions  New Patient Evaluation completed, echocardiogram, he is on Heparin infusion, IR consultation for possible thrombolysis. PRN analgesic ordered for pleuritic chest pain.        Thomasene Lot Elize Pinon 02/13/2021, 5:19 AM

## 2021-02-13 NOTE — Progress Notes (Signed)
ANTICOAGULATION CONSULT NOTE - Follow Up Consult  Pharmacy Consult for Heparin Indication: pulmonary embolus  No Known Allergies  Patient Measurements: Height: 5\' 9"  (175.3 cm) Weight: 93 kg (205 lb 0.4 oz) IBW/kg (Calculated) : 70.7 Heparin Dosing Weight: 89.8 kg  Vital Signs: Temp: 99 F (37.2 C) (03/17 0637) Temp Source: Oral (03/17 0200) BP: 133/99 (03/17 0730) Pulse Rate: 98 (03/17 0730)  Labs: Recent Labs    02/12/21 2149 02/12/21 2200 02/12/21 2230 02/12/21 2358 02/13/21 0002 02/13/21 0007 02/13/21 0624  HGB 16.8  --   --   --   --   --   --   HCT 51.2  --   --   --   --   --   --   PLT 133*  --   --  122*  --   --   --   APTT  --   --   --  21*  --   --   --   LABPROT  --   --   --  14.7  --   --   --   INR  --   --   --  1.2  --   --   --   CREATININE  --   --  1.32*  --   --   --  1.32*  CKTOTAL  --   --   --   --   --  241  --   TROPONINIHS  --  108*  --   --  222*  --   --     Estimated Creatinine Clearance: 76.2 mL/min (A) (by C-G formula based on SCr of 1.32 mg/dL (H)).   Medications:  Infusions:  . magnesium sulfate bolus IVPB      Assessment: 50 year old male who was admitted for extensive acute PE involving bilateral mainstem and proximal lobar pulmonary arteries with evidence of right heart strain. IV heparin was started, then held for total of 50 mg alteplase given at 0200 on 3/17. No anticoagulation prior to admission.  aPTT 4 hours after alteplase is < 80 so appropriate to resume heparin infusion. Hgb 15.4, platelets 149, stable. No s/sx bleeding reported.  Goal of Therapy:  Heparin level 0.3-0.5 units/ml for 24 hours, then 0.3-0.7 units/ml  Monitor platelets by anticoagulation protocol: Yes   Plan:  Start heparin 1100 units/hr, no bolus Heparin level in 6 hours Monitor daily heparin level, CBC, s/sx bleeding F/u transition to oral [facility]  [facility], PharmD PGY-1 Pharmacy Resident 02/13/2021 7:52 AM Please see AMION for all pharmacy  numbers

## 2021-02-13 NOTE — Progress Notes (Signed)
ANTICOAGULATION CONSULT NOTE - Follow Up Consult  Pharmacy Consult for Heparin Indication: pulmonary embolus  No Known Allergies  Patient Measurements: Height: 5\' 9"  (175.3 cm) Weight: 93 kg (205 lb 0.4 oz) IBW/kg (Calculated) : 70.7 Heparin Dosing Weight: 91.1 kg  Vital Signs: Temp: 99 F (37.2 C) (03/17 1543) Temp Source: Oral (03/17 1543) BP: 106/71 (03/17 2300) Pulse Rate: 90 (03/17 2300)  Labs: Recent Labs    02/12/21 2149 02/12/21 2200 02/12/21 2230 02/12/21 2358 02/13/21 0002 02/13/21 0007 02/13/21 0624 02/13/21 1514 02/13/21 2238  HGB 16.8  --   --   --   --   --  15.4 15.9  --   HCT 51.2  --   --   --   --   --  45.9 46.4  --   PLT 133*  --   --  122*  --   --  149* 133*  --   APTT  --   --   --  21*  --   --  48*  --   --   LABPROT  --   --   --  14.7  --   --  20.1*  --   --   INR  --   --   --  1.2  --   --  1.8*  --   --   HEPARINUNFRC  --   --   --   --   --   --   --  <0.10* 0.11*  CREATININE  --   --  1.32*  --   --   --  1.32*  --   --   CKTOTAL  --   --   --   --   --  241  --   --   --   TROPONINIHS  --  108*  --   --  222*  --   --   --   --     Estimated Creatinine Clearance: 76.2 mL/min (A) (by C-G formula based on SCr of 1.32 mg/dL (H)).   Assessment: 50 year old male who was admitted for extensive acute PE involving bilateral mainstem and proximal lobar pulmonary arteries with evidence of right heart strain. IV heparin was started, then held for total of 50 mg alteplase given at 0200 on 3/17. No anticoagulation prior to admission.  aPTT 4 hours after alteplase is < 80 so appropriate to resume heparin infusion.  Heparin level remains subtherapeutic (0.11) on gtt at 1400 units/hr. No issues with line or bleeding reported per RN.  Goal of Therapy:  Heparin level 0.3-0.5 units/ml for 24 hours until 3/18 0900, then 0.3-0.7 units/ml  Monitor platelets by anticoagulation protocol: Yes   Plan:  No bolus with recent alteplase. Increase heparin  to 1700 units/hr Check heparin level in 6 hours  4/18, PharmD, BCPS Please see amion for complete clinical pharmacist phone list 02/13/2021 11:35 PM

## 2021-02-13 NOTE — Progress Notes (Signed)
NAME:  Danny Lowe, MRN:  [id number], DOB:  [birth year], LOS: 0 ADMISSION DATE:  02/12/2021, CONSULTATION DATE:  02/13/21 REFERRING MD:  Tegeler, CHIEF COMPLAINT:  syncope  Brief History   49yM with borderline massive PE s/p 50 mg tpa through PIV 02/13/21  Past Medical History  Alcohol use disorder PTSD Depression  Significant Hospital Events   02/13/21 50 mg tpa infused  Consults:  PCCM  Procedures:  None  Significant Diagnostic Tests:  3/17 CTA Chest  1. Extensive acute pulmonary emboli involving the bilateral mainstem and proximal lobar pulmonary arteries. Evidence of right heart strain (RV/LV Ratio 2.0) consistent with at least submassive (intermediate risk) PE. The presence of right heart strain has been associated with an increased risk of morbidity and mortality. 2. Probable filling defect within the visualized IVC, concerning for thrombus. 3. Small layering left pleural effusion with associated left basilar consolidative opacity, which could reflect atelectasis and/or pulmonary infarct.  Echocardiogram > Pending  LE Venous doppler > Pending   Micro Data:  covid-19 neg  Antimicrobials:  None  Interim history/subjective:  Mr. Kaus states that he feels much better this morning and denies shortness of breath or chest pain.  I was also able to speak to his family members at bedside to inquire more about your family history, one family member did make me aware that Mr. Genelle Bal shows uncle actually had an extensive lower extremity DVT which required amputation.  Objective   Blood pressure 118/87, pulse (!) 104, temperature 99 F (37.2 C), resp. rate 18, height 5\' 9"  (1.753 m), weight 93 kg, SpO2 97 %.        Intake/Output Summary (Last 24 hours) at 02/13/2021 0648 Last data filed at 02/13/2021 0500 Gross per 24 hour  Intake 3408.64 ml  Output 600 ml  Net 2808.64 ml   Filed Weights   02/12/21 2153 02/13/21 0455  Weight: 97.5 kg 93 kg     Examination: Const: In no apparent distress, lying comfortably in bed, conversational HEENT: Atraumatic, normocephalic, nasal cannula in place Resp: CTA BL, no wheezes, crackles, rhonchi CV: Tachycardic, no murmurs, gallop, rub Abd: Bowel sounds present, nondistended, nontender to palpation Ext: No lower extremity edema, skin is warm to touch Skin: Warm, no rashes  Resolved Hospital Problem list   n/a  Assessment & Plan:  #Unprovoked massive pulmonary embolism There is questionable family history of VTE in his uncle.  It is reassuring that he remains hemodynamically stable presently.  He is status post TPA -Resume heparin pending his PTT level, preferably <1.5x normal.  *Continue heparin for today and transition to DOAC -Follow-up transthoracic echocardiogram and lower extremity Doppler ultrasound -Hold off on obtaining hypercoagulable panel until at least 4 weeks after discharge -Will likely require lifelong anticoagulation -Continue bedrest protocol  #Lactic acidosis 2/2 hypovolemia versus possible hypoperfusion from structural heart abnormality -Improved with fluids -Follow-up TTE  # Alcohol use disorder Elevated liver enzymes shows a classic AST: ALT 2: 1 pattern.  This is improving -CIWA protocol -Continue thiamine, folic acid -Advised patient to cessation from alcohol use.  We can support him with medication such as naltrexone  #Electrolyte abnormalities -Hypokalemia: Resolved -Hypomagnesemia: Repleted  #?CKD I-II Renal function stable with serum creatinine of 1.3  Best practice:  Diet: NPO for now Pain/Anxiety/Delirium protocol (if indicated): ciwa VAP protocol (if indicated): no DVT prophylaxis: no GI prophylaxis: no Glucose control: checking with chemistries Mobility: restricted to bed Code Status: full Family Communication: pt updated at bedside Disposition: [facility] ICU  Labs  CBC: Recent Labs  Lab 02/12/21 2149 02/12/21 2358  WBC 12.4*  --   HGB  16.8  --   HCT 51.2  --   MCV 102.0*  --   PLT 133* 122*    Basic Metabolic Panel: Recent Labs  Lab 02/12/21 2200 02/12/21 2230  NA  --  138  K  --  3.3*  CL  --  101  CO2  --  20*  GLUCOSE  --  86  BUN  --  11  CREATININE  --  1.32*  CALCIUM  --  8.3*  MG 1.5*  --    GFR: Estimated Creatinine Clearance: 76.2 mL/min (A) (by C-G formula based on SCr of 1.32 mg/dL (H)). Recent Labs  Lab 02/12/21 2149 02/12/21 2223 02/13/21 0002  WBC 12.4*  --   --   LATICACIDVEN  --  5.6* 3.9*    Liver Function Tests: Recent Labs  Lab 02/12/21 2200  AST 80*  ALT 25  ALKPHOS 26*  BILITOT 1.1  PROT 5.0*  ALBUMIN 2.2*   Recent Labs  Lab 02/12/21 2200  LIPASE 26   Recent Labs  Lab 02/13/21 0008  AMMONIA 40*    ABG    Component Value Date/Time   HCO3 23.6 02/12/2021 2223   ACIDBASEDEF 2.6 (H) 02/12/2021 2223   O2SAT 47.3 02/12/2021 2223     Coagulation Profile: Recent Labs  Lab 02/12/21 2358  INR 1.2    Cardiac Enzymes: Recent Labs  Lab 02/13/21 0007  CKTOTAL 241    HbA1C: No results found for: HGBA1C  CBG: Recent Labs  Lab 02/12/21 2150  GLUCAP 76    Review of Systems:   A twelve point review of systems is negative except as otherwise noted in HPI  Past Medical History  He,  has a past medical history of Depression.   Surgical History   History reviewed. No pertinent surgical history.   Social History   reports that he has never smoked. His smokeless tobacco use includes chew. He reports current alcohol use. He reports that he does not use drugs.   Family History   His family history is not on file.   Allergies No Known Allergies   Home Medications  Prior to Admission medications   Not on File     Critical care time:    Jodelle Red, MD Internal Medicine Teaching Service PGY-3

## 2021-02-13 NOTE — Plan of Care (Signed)
  Problem: Education: Goal: Knowledge of General Education information will improve Description: Including pain rating scale, medication(s)/side effects and non-pharmacologic comfort measures Outcome: Progressing   Problem: Health Behavior/Discharge Planning: Goal: Ability to manage health-related needs will improve Outcome: Progressing   Problem: Clinical Measurements: Goal: Ability to maintain clinical measurements within normal limits will improve Outcome: Progressing Goal: Will remain free from infection Outcome: Progressing Goal: Diagnostic test results will improve Outcome: Progressing Goal: Respiratory complications will improve Outcome: Progressing Goal: Cardiovascular complication will be avoided Outcome: Progressing   Problem: Activity: Goal: Risk for activity intolerance will decrease Outcome: Progressing   Problem: Nutrition: Goal: Adequate nutrition will be maintained Outcome: Progressing   Problem: Coping: Goal: Level of anxiety will decrease Outcome: Progressing   Problem: Elimination: Goal: Will not experience complications related to bowel motility Outcome: Progressing Goal: Will not experience complications related to urinary retention Outcome: Progressing   Problem: Pain Managment: Goal: General experience of comfort will improve Outcome: Progressing   Problem: Safety: Goal: Ability to remain free from injury will improve Outcome: Progressing   Problem: Skin Integrity: Goal: Risk for impaired skin integrity will decrease Outcome: Progressing   Problem: Consults Goal: Venous Thromboembolism Patient Education Description: See Patient Education Module for education specifics. Outcome: Progressing Goal: Pharmacy Consult for anticoagulation Outcome: Progressing   Problem: Phase I Progression Outcomes Goal: Pain controlled with appropriate interventions Outcome: Progressing Goal: Dyspnea controlled at rest (PE) Outcome: Progressing Goal:  Tolerating diet Outcome: Progressing Goal: Initial discharge plan identified Outcome: Progressing Goal: Voiding-avoid urinary catheter unless indicated Outcome: Progressing Goal: Hemodynamically stable Outcome: Progressing Goal: Other Phase I Outcomes/Goals Outcome: Progressing   Problem: Phase II Progression Outcomes Goal: Therapeutic drug levels for anticoagulation Outcome: Progressing Goal: 02 sats trending upward/stable (PE) Outcome: Progressing Goal: Discharge plan established Outcome: Progressing Goal: Tolerating diet Outcome: Progressing Goal: Other Phase II Outcomes/Goals Outcome: Progressing   Problem: Phase III Progression Outcomes Goal: 02 sats stabilized Outcome: Progressing Goal: Activity at appropriate level-compared to baseline Description: (UP IN CHAIR FOR HEMODIALYSIS) Outcome: Progressing Goal: Discharge plan remains appropriate-arrangements made Outcome: Progressing Goal: Other Phase III Outcomes/Goals Outcome: Progressing   Problem: Discharge Progression Outcomes Goal: Barriers To Progression Addressed/Resolved Outcome: Progressing Goal: Discharge plan in place and appropriate Outcome: Progressing Goal: Pain controlled with appropriate interventions Outcome: Progressing Goal: Hemodynamically stable Outcome: Progressing Goal: Complications resolved/controlled Outcome: Progressing Goal: Tolerating diet Outcome: Progressing Goal: Activity appropriate for discharge plan Outcome: Progressing Goal: Other Discharge Outcomes/Goals Outcome: Progressing

## 2021-02-13 NOTE — TOC Benefit Eligibility Note (Addendum)
Patient Product/process development scientist completed.    The patient is currently admitted and upon discharge could be taking Eliquis starter Pack.  The current 30 day co-pay is, $35.00.   The patient is currently admitted and upon discharge could be taking Xarelto starter Pack.  The current 30 day co-pay is, $35.00.   The patient is insured through [employer]   Danny Lowe, CPhT Pharmacy Patient Advocate Specialist [place] Antimicrobial Stewardship Team Direct Number: [phone]  Fax: [phone]

## 2021-02-13 NOTE — ED Notes (Signed)
Repeat trop is 45 RN notified of same

## 2021-02-13 NOTE — Progress Notes (Signed)
ANTICOAGULATION CONSULT NOTE - Follow Up Consult  Pharmacy Consult for Heparin Indication: pulmonary embolus  No Known Allergies  Patient Measurements: Height: 5\' 9"  (175.3 cm) Weight: 93 kg (205 lb 0.4 oz) IBW/kg (Calculated) : 70.7 Heparin Dosing Weight: 91.1 kg  Vital Signs: Temp: 99 F (37.2 C) (03/17 1543) Temp Source: Oral (03/17 1543) BP: 116/83 (03/17 1445) Pulse Rate: 91 (03/17 1500)  Labs: Recent Labs    02/12/21 2149 02/12/21 2200 02/12/21 2230 02/12/21 2358 02/13/21 0002 02/13/21 0007 02/13/21 0624 02/13/21 1514  HGB 16.8  --   --   --   --   --  15.4 15.9  HCT 51.2  --   --   --   --   --  45.9 46.4  PLT 133*  --   --  122*  --   --  149* 133*  APTT  --   --   --  21*  --   --  48*  --   LABPROT  --   --   --  14.7  --   --  20.1*  --   INR  --   --   --  1.2  --   --  1.8*  --   HEPARINUNFRC  --   --   --   --   --   --   --  <0.10*  CREATININE  --   --  1.32*  --   --   --  1.32*  --   CKTOTAL  --   --   --   --   --  241  --   --   TROPONINIHS  --  108*  --   --  222*  --   --   --     Estimated Creatinine Clearance: 76.2 mL/min (A) (by C-G formula based on SCr of 1.32 mg/dL (H)).   Medications:  Infusions:  . heparin 1,100 Units/hr (02/13/21 1500)    Assessment: 50 year old male who was admitted for extensive acute PE involving bilateral mainstem and proximal lobar pulmonary arteries with evidence of right heart strain. IV heparin was started, then held for total of 50 mg alteplase given at 0200 on 3/17. No anticoagulation prior to admission.  aPTT 4 hours after alteplase is < 80 so appropriate to resume heparin infusion.  PM update - heparin level undetectable. CBC stable. No bleeding or issues with infusion per discussion with RN.  Goal of Therapy:  Heparin level 0.3-0.5 units/ml for 24 hours, then 0.3-0.7 units/ml  Monitor platelets by anticoagulation protocol: Yes   Plan:  No bolus with recent alteplase. Increase heparin to 1400  units/hr Check heparin level in 6 hours Monitor daily CBC, s/sx bleeding F/u transition to oral anticoagulant as appropriate - possibly 3/18 per CCM note   4/18, PharmD, BCPS Please check AMION for all [facility] Pharmacy contact numbers Clinical Pharmacist 02/13/2021 4:29 PM

## 2021-02-14 ENCOUNTER — Telehealth: Payer: Self-pay | Admitting: Acute Care

## 2021-02-14 DIAGNOSIS — I2609 Other pulmonary embolism with acute cor pulmonale: Secondary | ICD-10-CM | POA: Diagnosis not present

## 2021-02-14 LAB — CBC
HCT: 45.3 % (ref 39.0–52.0)
Hemoglobin: 15.4 g/dL (ref 13.0–17.0)
MCH: 33.7 pg (ref 26.0–34.0)
MCHC: 34 g/dL (ref 30.0–36.0)
MCV: 99.1 fL (ref 80.0–100.0)
Platelets: 146 10*3/uL — ABNORMAL LOW (ref 150–400)
RBC: 4.57 MIL/uL (ref 4.22–5.81)
RDW: 15 % (ref 11.5–15.5)
WBC: 8.5 10*3/uL (ref 4.0–10.5)
nRBC: 0.2 % (ref 0.0–0.2)

## 2021-02-14 LAB — COMPREHENSIVE METABOLIC PANEL
ALT: 36 U/L (ref 0–44)
AST: 67 U/L — ABNORMAL HIGH (ref 15–41)
Albumin: 2.4 g/dL — ABNORMAL LOW (ref 3.5–5.0)
Alkaline Phosphatase: 33 U/L — ABNORMAL LOW (ref 38–126)
Anion gap: 7 (ref 5–15)
BUN: 6 mg/dL (ref 6–20)
CO2: 26 mmol/L (ref 22–32)
Calcium: 8.2 mg/dL — ABNORMAL LOW (ref 8.9–10.3)
Chloride: 103 mmol/L (ref 98–111)
Creatinine, Ser: 1.18 mg/dL (ref 0.61–1.24)
GFR, Estimated: >60 mL/min (ref >60)
Glucose, Bld: 92 mg/dL (ref 70–99)
Potassium: 5 mmol/L (ref 3.5–5.1)
Sodium: 136 mmol/L (ref 135–145)
Total Bilirubin: 1.2 mg/dL (ref 0.3–1.2)
Total Protein: 5.7 g/dL — ABNORMAL LOW (ref 6.5–8.1)

## 2021-02-14 LAB — HEPARIN LEVEL (UNFRACTIONATED): Heparin Unfractionated: 0.42 IU/mL (ref 0.30–0.70)

## 2021-02-14 MED ORDER — THIAMINE HCL 100 MG PO TABS
100.0000 mg | ORAL_TABLET | Freq: Every day | ORAL | Status: DC
  Administered 2021-02-15: 100 mg via ORAL
  Filled 2021-02-14: qty 1

## 2021-02-14 MED ORDER — APIXABAN 5 MG PO TABS
10.0000 mg | ORAL_TABLET | Freq: Two times a day (BID) | ORAL | Status: DC
  Administered 2021-02-14 – 2021-02-15 (×3): 10 mg via ORAL
  Filled 2021-02-14 (×2): qty 2

## 2021-02-14 MED ORDER — APIXABAN 5 MG PO TABS: 5.0000 mg | ORAL_TABLET | Freq: Two times a day (BID) | ORAL | Status: DC

## 2021-02-14 MED ORDER — BUDESONIDE 0.25 MG/2ML IN SUSP: 0.2500 mg | Freq: Two times a day (BID) | RESPIRATORY_TRACT | Status: DC

## 2021-02-14 NOTE — Discharge Instructions (Signed)
You need to start seeing a primary care physician. They will need to have a refill your Eliquis. You should also have a workup to try and determine why you developed this left leg clot that likely led to this large pulmonary embolism .   Pulmonary Embolism  A pulmonary embolism (PE) is a sudden blockage or decrease of blood flow in one or both lungs that happens when a clot travels into the arteries of the lung (pulmonary arteries). Most blockages come from a blood clot that forms in the vein of a leg or arm (deep vein thrombosis, DVT) and travels to the lungs. A clot is blood that has thickened into a gel or solid. PE is a dangerous and life-threatening condition that needs to be treated right away. What are the causes? This condition is usually caused by a blood clot that forms in a vein and moves to the lungs. In rare cases, it may be caused by air, fat, part of a tumor, or other tissue that moves through the veins and into the lungs. What increases the risk? The following factors may make you more likely to develop this condition:  Experiencing a traumatic injury, such as breaking a hip or leg.  Having: ? A spinal cord injury. ? Major surgery, especially hip or knee replacement, or surgery on parts of the nervous system or on the abdomen. ? A stroke. ? A blood clotting disease. ? Long-term (chronic) lung or heart disease. ? Cancer, especially if you are being treated with chemotherapy. ? A central venous catheter.  Taking medicines that contain estrogen. These include birth control pills and hormone replacement therapy.  Being: ? Pregnant. ? In the period of time after your baby is delivered (postpartum). ? Older than age 10. ? Overweight. ? A smoker, especially if you have other risks. ? Not very active (sedentary), not being able to move at all, or spending long periods sitting, such as travel over 6 hours. You are also at a greater risk if you have a leg in a cast or splint. What  are the signs or symptoms? Symptoms of this condition usually start suddenly and include:  Shortness of breath during activity or at rest.  Coughing, coughing up blood, or coughing up bloody mucus.  Chest pain, back pain, or shoulder blade pain that gets worse with deep breaths.  Rapid or irregular heartbeat.  Feeling light-headed or dizzy, or fainting.  Feeling anxious.  Pain and swelling in a leg. This is a symptom of DVT, which can lead to PE. How is this diagnosed? This condition may be diagnosed based on your medical history, a physical exam, and tests. Tests may include:  Blood tests.  An ECG (electrocardiogram) of the heart.  A CT pulmonary angiogram. This test checks blood flow in and around your lungs.  A ventilation-perfusion scan, also called a lung VQ scan. This test measures air flow and blood flow to the lungs.  An ultrasound to check for a DVT. How is this treated? Treatment for this condition depends on many factors, such as the cause of your PE, your risk for bleeding or developing more clots, and other medical conditions you may have. Treatment aims to stop blood clots from forming or growing larger. In some cases, treatment may be aimed at breaking apart or removing the blood clot. Treatment may include:  Medicines, such as: ? Blood thinning medicines, also called anticoagulants, to stop clots from forming and growing. ? Medicines that break apart clots (  thrombolytics).  Procedures, such as: ? Using a flexible tube to remove a blood clot (embolectomy) or to deliver medicine to destroy it (catheter-directed thrombolysis). ? Surgery to remove the clot (surgical embolectomy). This is rare. You may need a combination of immediate, long-term, and extended treatments. Your treatment may continue for several months (maintenance therapy) or longer depending on your medical conditions. You and your health care provider will work together to choose the treatment program  that is best for you. Follow these instructions at home: Medicines  Take over-the-counter and prescription medicines only as told by your health care provider.  If you are taking blood thinners: ? Talk with your health care provider before you take any medicines that contain aspirin or NSAIDs, such as ibuprofen. These medicines increase your risk for dangerous bleeding. ? Take your medicine exactly as told, at the same time every day. ? Avoid activities that could cause injury or bruising, and follow instructions about how to prevent falls. ? Wear a medical alert bracelet or carry a card that lists what medicines you take.  Understand what foods and drugs interact with any medicines that you are taking. General instructions  Ask your health care provider when you may return to your normal activities. Avoid sitting or lying for a long time without moving.  Maintain a healthy weight. Ask your health care provider what weight is healthy for you.  Do not use any products that contain nicotine or tobacco, such as cigarettes, e-cigarettes, and chewing tobacco. If you need help quitting, ask your health care provider.  Talk with your health care provider about any travel plans. It is important to make sure that you are still able to take your medicine while traveling.  Keep all follow-up visits as told by your health care provider. This is important. Where to find more information  American Lung Association: www.lung.org  Centers for Disease Control and Prevention: FootballExhibition.com.br Contact a health care provider if:  You missed a dose of your blood thinner medicine. Get help right away if you:  Have: ? New or increased pain, swelling, warmth, or redness in an arm or leg. ? Shortness of breath that gets worse during activity or at rest. ? A fever. ? Worsening chest pain. ? A rapid or irregular heartbeat. ? A severe headache. ? Vision changes. ? A serious fall or accident, or you hit your  head. ? Stomach pain. ? Blood in your vomit, stool, or urine. ? A cut that will not stop bleeding.  Cough up blood.  Feel light-headed or dizzy, and that feeling does not go away.  Cannot move your arms or legs.  Are confused or have memory loss. These symptoms may represent a serious problem that is an emergency. Do not wait to see if the symptoms will go away. Get medical help right away. Call your local emergency services (911 in the [place] Do not drive yourself to the hospital. Summary  A pulmonary embolism (PE) is a serious and potentially life-threatening condition, in which a blood clot from one part of the body (deep vein thrombosis, DVT) travels to the arteries of the lung, causing a sudden blockage or decrease of blood flow to the lungs. This may result in shortness of breath, chest pain, dizziness, and fainting.  Treatments for this condition usually include medicines to thin your blood (anticoagulants) or medicines to break apart blood clots (thrombolytics).  If you are given blood thinners, take your medicine exactly as told by your health care  provider, at the same time every day. This is important.  Understand what foods and drugs interact with any medicines that you are taking.  If you have signs of PE or DVT, call your local emergency services (911 in the [place] This information is not intended to replace advice given to you by your health care provider. Make sure you discuss any questions you have with your health care provider. Document Revised: 09/29/2019 Document Reviewed: 09/29/2019 Elsevier Patient Education  2021 [employer].   Information on my medicine - ELIQUIS (apixaban)  This medication education was reviewed with me or my healthcare representative as part of my discharge preparation.    Why was Eliquis prescribed for you? Eliquis was prescribed to treat blood clots that may have been found in the veins of your legs (deep vein thrombosis) or in your  lungs (pulmonary embolism) and to reduce the risk of them occurring again.  What do You need to know about Eliquis ? The starting dose is 10 mg (two 5 mg tablets) taken TWICE daily for the FIRST SEVEN (7) DAYS, then on 02/21/21  the dose is reduced to ONE 5 mg tablet taken TWICE daily.  Eliquis may be taken with or without food.   Try to take the dose about the same time in the morning and in the evening. If you have difficulty swallowing the tablet whole please discuss with your pharmacist how to take the medication safely.  Take Eliquis exactly as prescribed and DO NOT stop taking Eliquis without talking to the doctor who prescribed the medication.  Stopping may increase your risk of developing a new blood clot.  Refill your prescription before you run out.  After discharge, you should have regular check-up appointments with your healthcare provider that is prescribing your Eliquis.    What do you do if you miss a dose? If a dose of ELIQUIS is not taken at the scheduled time, take it as soon as possible on the same day and twice-daily administration should be resumed. The dose should not be doubled to make up for a missed dose.  Important Safety Information A possible side effect of Eliquis is bleeding. You should call your healthcare provider right away if you experience any of the following: ? Bleeding from an injury or your nose that does not stop. ? Unusual colored urine (red or dark brown) or unusual colored stools (red or black). ? Unusual bruising for unknown reasons. ? A serious fall or if you hit your head (even if there is no bleeding).  Some medicines may interact with Eliquis and might increase your risk of bleeding or clotting while on Eliquis. To help avoid this, consult your healthcare provider or pharmacist prior to using any new prescription or non-prescription medications, including herbals, vitamins, non-steroidal anti-inflammatory drugs (NSAIDs) and  supplements.  This website has more information on Eliquis (apixaban): http://www.eliquis.com/eliquis/home

## 2021-02-14 NOTE — Evaluation (Signed)
Physical Therapy Evaluation/ Discharge Patient Details Name: Danny Lowe MRN: [id number] DOB: [birth year] Today's Date: 02/14/2021   History of Present Illness  50 yo admitted 3/16 with syncope at home hitting face without fx and additional 2 syncopal events with EMS. Pt with massive bil PE and LLE DVT s/p tPA and heparin. PMhx: PTSD, depression ETOH abuse  Clinical Impression  Pt >24hrs on heparin with therapeutic heparin and NP clearance for mobility post tPA. Pt very pleasant and moving well able to make laps around unit without difficulty, CP, SOB or LOB. 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 Pt currently demonstrates baseline mobility without further therapy needs and encouraged ROM for Rt shoulder due to soreness with syncope. Pt states understanding of above and encouraged increased walking program at D/C as pt states he does mostly weight lifting in the gym and at times drives a lot for his job.  No further needs, will sign off with pt aware and agreeable.     Follow Up Recommendations No PT follow up    Equipment Recommendations  None recommended by PT    Recommendations for Other Services       Precautions / Restrictions Precautions Precautions: None      Mobility  Bed Mobility Overal bed mobility: Modified Independent                  Transfers Overall transfer level: Independent                  Ambulation/Gait Ambulation/Gait assistance: Independent Gait Distance (Feet): 800 Feet Assistive device: None Gait Pattern/deviations: WFL(Within Functional Limits)   Gait velocity interpretation: >4.37 ft/sec, indicative of normal walking speed General Gait Details: pt with good strength and gait with SpO2 >97% on RA  Stairs            Wheelchair Mobility    Modified Rankin (Stroke Patients Only)       Balance Overall balance assessment: No apparent balance deficits (not formally assessed)                                            Pertinent Vitals/Pain Pain Assessment: 0-10 Pain Score: 3  Pain Location: right glute Pain Descriptors / Indicators: Aching Pain Intervention(s): Limited activity within patient's tolerance;Monitored during session;Repositioned    Home Living Family/patient expects to be discharged to:: Private residence Living Arrangements: Children Available Help at Discharge: Family;Available 24 hours/day Type of Home: Apartment Home Access: Level entry     Home Layout: One level Home Equipment: None      Prior Function Level of Independence: Independent         Comments: 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[employer], contracting        Extremity/Trunk Assessment   Upper Extremity Assessment Upper Extremity Assessment: RUE deficits/detail RUE Deficits / Details: pt reports sore right shoulder from landing on it with syncope but able to perform full AAROM for shoulder flexion and abduction and educated for progressive ROM and strengthening    Lower Extremity Assessment Lower Extremity Assessment: Overall WFL for tasks assessed    Cervical / Trunk Assessment Cervical / Trunk Assessment: Normal  Communication   Communication: No difficulties  Cognition Arousal/Alertness: Awake/alert Behavior During Therapy: WFL for tasks assessed/performed Overall Cognitive Status: Within Functional Limits for tasks assessed  General Comments      Exercises     Assessment/Plan    PT Assessment Patent does not need any further PT services  PT Problem List         PT Treatment Interventions      PT Goals (Current goals can be found in the Care Plan section)  Acute Rehab PT Goals PT Goal Formulation: All assessment and education complete, DC therapy    Frequency     Barriers to discharge        Co-evaluation               AM-PAC PT "6 Clicks" Mobility  Outcome Measure  Help needed turning from your back to your side while in a flat bed without using bedrails?: None Help needed moving from lying on your back to sitting on the side of a flat bed without using bedrails?: None Help needed moving to and from a bed to a chair (including a wheelchair)?: None Help needed standing up from a chair using your arms (e.g., wheelchair or bedside chair)?: None Help needed to walk in hospital room?: None Help needed climbing 3-5 steps with a railing? : None 6 Click Score: 24    End of Session Equipment Utilized During Treatment: Gait belt Activity Tolerance: Patient tolerated treatment well Patient left: in chair;with call bell/phone within reach Nurse Communication: Mobility status PT Visit Diagnosis: Other abnormalities of gait and mobility (R26.89)    Time: 5427-0623 PT Time Calculation (min) (ACUTE ONLY): 25 min   Charges:   PT Evaluation $PT Eval Moderate Complexity: 1 Mod          Ewel Lona P, PT Acute Rehabilitation Services Pager: [phone] Office: [phone]   Jago Carton B Joson Sapp 02/14/2021, 10:27 AM

## 2021-02-14 NOTE — Progress Notes (Addendum)
NAME:  Danny Lowe, MRN:  [id number], DOB:  1971-04-22, LOS: 1 ADMISSION DATE:  02/12/2021, CONSULTATION DATE:  02/14/2021 REFERRING MD:  Dr. Rush Landmark, CHIEF COMPLAINT:  Syncope  History of Present Illness:  49yM with unprovoked high risk submassive PE s/p 50 mg tpa through PIV 02/13/21  Pertinent  Medical History  Alcohol use disorder PTSD Depression  Significant Hospital Events:  . 3/16 > Admitted for syncope found to have submassive PE, received half dose TPA with significant improvement, IV heparin started  . 3/17 > Acute left deep vein thrombosis involving the left femoral vein, left popliteal vein, left posterior tibial veins, and left peroneal veins.  . 3/18 > Heparin stopped and Eliquis started, transferred to the floor  Interim History / Subjective:  Seen lying in bed with reported improved dyspnea, now on RA  Objective   Blood pressure 117/82, pulse 95, temperature 98.6 F (37 C), temperature source Oral, resp. rate 16, height 5\' 9"  (1.753 m), weight 93 kg, SpO2 100 %.        Intake/Output Summary (Last 24 hours) at 02/14/2021 0915 Last data filed at 02/14/2021 0600 Gross per 24 hour  Intake 783.88 ml  Output 1210 ml  Net -426.12 ml   Filed Weights   02/12/21 2153 02/13/21 0455  Weight: 97.5 kg 93 kg    Examination: General:Pleasant adult male lying in bed in no acute distress  HEENT: ETT, MM pink/moist, PERRL,  Neuro: Alert and oriented x3, non-focal  CV: s1s2 regular rate and rhythm, no murmur, rubs, or gallops,  PULM:  Clear to ascultation, no added breath sounds, no increased work of breathing, on RA GI: soft, bowel sounds active in all 4 quadrants, non-tender, non-distended Extremities: warm/dry, no edema  Skin: no rashes or lesions  Labs/imaging personally reviewed    3/16 CTA chest > extensive submassive pulmonary emboli involving the bilateral mainstem and proximal lobar pulmonary arteries, RV/LV ratio 2.0  3/17 complete echocardiogram > EF 60  to 65% with no LV regional wall motion abnormality, signs of right ventricular volume overload and RV strain.   3/17 Lower extremity doppler > acute deep vein thrombosis involving the left femoral vein, left popliteal vein, left posterior tibial veins, and left  peroneal veins.   Resolved Hospital Problem list   Electrolyte abnormalities AK Lactic acidosis   Assessment & Plan:  Unprovoked massive pulmonary embolism -There is questionable family history of VTE in his uncle. Remains stable status post TPA -He dose report 3-4 month lapse in workout routine but denies any recent immobility  P: Transition from IV heparin to PO Eliquis  Will likely need life long anticoagulation Needs hypercoagulation workup in 4 weeks  Allow ambulation today  Possible discharge 3/19  Alcohol use disorder -Elevated liver enzymes shows a classic AST: ALT 2: 1 pattern.  This is improving P: Continue CIWA protocl  Continue Thiamine and folic acid  Cessation education provided   Best practice (evaluated daily)  Diet:  Oral, regular diet Pain/Anxiety/Delirium protocol (if indicated): No VAP protocol (if indicated): Not indicated DVT prophylaxis: IV heparin drip  GI prophylaxis: N/A Glucose control:  N/A Central venous access:  N/A Arterial line:  N/A Foley:  N/A Mobility:  OOB  PT consulted: Yes Last date of multidisciplinary goals of care discussion [N/A] Code Status:  full code Disposition: Transfer to floor    Signature:  4/19, NP-C  Pulmonary & Critical Care Personal contact information can be found on Amion  If no response please page:  Adult pulmonary and critical care medicine pager on Amion unitl 7pm After 7pm please call [phone] 02/14/2021, 9:35 AM

## 2021-02-14 NOTE — Progress Notes (Signed)
Pt admitted to [facility] from [facility]. Pt is A&O x4 and neuro intact.  Pt placed on telemetry and CCMD notified.  CHG bath completed.  Vitals taken and all within normal range. Pt is currently comfortable and not in pain.

## 2021-02-14 NOTE — Discharge Summary (Addendum)
Physician Discharge Summary     Patient ID: Danny Lowe MRN: [id number] DOB/AGE: July 27, 1971 50 y.o.  Admit date: 02/12/2021 Discharge date: 02/15/2021  Discharge Diagnoses:   Unprovoked massive pulmonary embolism Lactic acidosis Alcohol use disorder  Discharge summary   Danny Lowe is a 50 year old male with a past medical history significant for daily alcohol consumption, PTSD, and depression who presented to the ED after experiencing 3 syncopal episodes today of admission.  Patient did report striking head during 1 syncopal episode.  Additional history included left lower extremity swelling that was first noted on 09/2020 that had progressively worsened prior to admission.  Patient son found him on the floor.  Initially seen hypotensive and hypoxic per EMS.  On ED arrival patient was seen tachypneic, tachycardic, and mildly hypertensive.  Given presentation of lower extremity swelling, scant hemoptysis, syncope, and hypertension patient underwent CTA chest to evaluate for PE.  CTA demonstrated very large bilateral pulmonary emboli with evidence of right heart strain.  Pertinent lab work included potassium 3.3, CO2 20, creatinine 1.32, BNP 213.4, lactic acid 5.6, troponin I 08.  Given submassive PE with evidence of right heart strain PCCM was consulted for further management and admission.  Patient's options were discussed on admission with patient and PCCM team and decision was made to proceed with half dose of IV TPA.  Patient tolerated this well.  Patient remained on IV heparin drip from 316 01/02/2017 with later transition to p.o. Eliquis.  Lower extremity ultrasound revealed acute deep vein thrombosis involving the left femoral vein, left popliteal vein, left posterior tibialis vein and left peroneal vein.  Complete 2D echocardiogram with preserved EF of 60 to 65% with no regional wall motion abnormality but significant evidence of right ventricular strain and volume overload.     By 3/19 patient remained stable on p.o. Eliquis, he has been able to ambulate with physical therapy with no limitations seen and no development of hypoxia.  Patient is stable for discharge.  Discharge plan by Active Problems   Unprovoked massive pulmonary embolism -There is questionable family history of VTE in his uncle. Remains stable status post TPA -He dose report 3-4 month lapse in workout routine but denies any recent immobility  P: Continue p.o. Eliquis upon discharge, likely will need this lifelong Patient needs to establish care with a primary care physician upon discharge Have encouraged patient to discuss hypercoagulation work-up with PCP within 4 weeks He has follow up with Dr. Judeth Horn in the Pulmonary clinic on 3/24 at 10:30 AM No activity limits upon discharge  Alcohol use disorder -Elevated liver enzymes shows a classic AST: ALT 2: 1 pattern. This is improving P: Patient educated on need for cessation and voiced understanding   Significant Hospital tests/ studies   3/16 CTA chest > extensive submassive pulmonary emboli involving the bilateral mainstem and proximal lobar pulmonary arteries, RV/LV ratio 2.0  3/17 complete echocardiogram > EF 60 to 65% with no LV regional wall motion abnormality, signs of right ventricular volume overload and RV strain.   3/17 Lower extremity doppler > acute deep vein thrombosis involving the left femoral vein, left popliteal vein, left posterior tibial veins, and left  peroneal veins.   Procedures    3/16 half dose TPA for submassive PE  Culture data/antimicrobials   N/A   Consults  IR  Discharge exam   BP 111/79   Pulse 90   Temp 98.6 F (37 C) (Oral)   Resp (!) 8   Ht  (1.753 m)  Wt 93 kg   SpO2 99%   BMI 30.28 kg/m   Blood pressure 119/81, pulse 98, temperature 98.1 F (36.7 C), temperature source Oral, resp. rate 16, height 5\' 9"  (1.753 m), weight 92.6 kg, SpO2 100 %. Gen:      No acute distress HEENT:   EOMI, sclera anicteric Neck:     No masses; no thyromegaly Lungs:    Clear to auscultation bilaterally; normal respiratory effort CV:         Regular rate and rhythm; no murmurs Abd:      + bowel sounds; soft, non-tender; no palpable masses, no distension Ext:    No edema; adequate peripheral perfusion Skin:      Warm and dry; no rash Neuro: alert and oriented x 3 Psych: normal mood and affect  Labs at discharge   Lab Results  Component Value Date   CREATININE 1.18 02/14/2021   BUN 6 02/14/2021   NA 136 02/14/2021   K 5.0 02/14/2021   CL 103 02/14/2021   CO2 26 02/14/2021   Lab Results  Component Value Date   WBC 8.5 02/14/2021   HGB 15.4 02/14/2021   HCT 45.3 02/14/2021   MCV 99.1 02/14/2021   PLT 146 (L) 02/14/2021   Lab Results  Component Value Date   ALT 36 02/14/2021   AST 67 (H) 02/14/2021   ALKPHOS 33 (L) 02/14/2021   BILITOT 1.2 02/14/2021   Lab Results  Component Value Date   INR 1.8 (H) 02/13/2021   INR 1.2 02/12/2021    Current radiological studies    CT Head Wo Contrast  Result Date: 02/13/2021 CLINICAL DATA:  Initial evaluation for acute trauma, fall. EXAM: CT HEAD WITHOUT CONTRAST TECHNIQUE: Contiguous axial images were obtained from the base of the skull through the vertex without intravenous contrast. COMPARISON:  None. FINDINGS: Brain: Cerebral volume within normal limits. No acute intracranial hemorrhage. No acute large vessel territory infarct. No mass lesion, midline shift or mass effect. No hydrocephalus or extra-axial fluid collection. Vascular: No hyperdense vessel. Skull: Small soft tissue contusion present at the right parieto-occipital scalp. Calvarium intact. Sinuses/Orbits: Globes and orbital soft tissues within normal limits. Visualized paranasal sinuses are clear. No mastoid effusion. Other: None. IMPRESSION: 1. No acute intracranial abnormality. 2. Small right parieto-occipital scalp contusion. No calvarial fracture. Electronically Signed    By: 02/15/2021 M.D.   On: 02/13/2021 00:26   CT Angio Chest PE W and/or Wo Contrast  Result Date: 02/13/2021 CLINICAL DATA:  Initial evaluation for acute syncope, concern for PE. EXAM: CT ANGIOGRAPHY CHEST WITH CONTRAST TECHNIQUE: Multidetector CT imaging of the chest was performed using the standard protocol during bolus administration of intravenous contrast. Multiplanar CT image reconstructions and MIPs were obtained to evaluate the vascular anatomy. CONTRAST:  32mL OMNIPAQUE IOHEXOL 350 MG/ML SOLN COMPARISON:  None. FINDINGS: Cardiovascular: Intrathoracic aorta normal in caliber without acute abnormality. Visualized great vessels within normal limits. Heart size mildly enlarged.  No pericardial effusion. Pulmonary arterial tree adequately opacified for evaluation. Extensive acute pulmonary emboli seen within the bilateral mainstem and proximal lobar pulmonary arteries, with involvement of all lobes. No frank saddle pulmonary embolus. Main pulmonary artery mildly dilated up to 3.1 cm. Elevated RV to LV ratio of approximately 2.0 with mild right-to-left bowing of the intraventricular septum, consistent with right heart strain. Reflux of contrast material into the IVC and hepatic veins also consistent with right heart strain. Probable filling defect within the visualized IVC, concerning for thrombus (series 5, image  123). Inferiorly, the partially visualized IVC is enlarged/dilated with associated stranding, also suspicious for acute thrombosis (series 5, image 154). Mediastinum/Nodes: Thyroid within normal limits. No pathologically enlarged mediastinal, hilar, or axillary lymph nodes. Esophagus within normal limits. Lungs/Pleura: Tracheobronchial tree intact and patent. Small layering left pleural effusion. Associated left basilar consolidative opacity could reflect atelectasis or possibly pulmonary infarct. Mild atelectatic changes noted along the right minor fissure and lingula as well. Lungs are  otherwise clear. No pulmonary edema. No pneumothorax. No worrisome pulmonary nodule or mass. Upper Abdomen: Visualized upper abdomen demonstrates no other acute finding. Musculoskeletal: Probable 1.4 cm sebaceous cyst noted within the subcutaneous fat of the lower mid back (series 5, image 145). Visualized external soft tissues demonstrate no other acute finding. No acute osseous finding. No discrete or worrisome osseous lesions. Review of the MIP images confirms the above findings. IMPRESSION: 1. Extensive acute pulmonary emboli involving the bilateral mainstem and proximal lobar pulmonary arteries. Evidence of right heart strain (RV/LV Ratio 2.0) consistent with at least submassive (intermediate risk) PE. The presence of right heart strain has been associated with an increased risk of morbidity and mortality. 2. Probable filling defect within the visualized IVC, concerning for thrombus. 3. Small layering left pleural effusion with associated left basilar consolidative opacity, which could reflect atelectasis and/or pulmonary infarct. Critical Value/emergent results were discussed by telephone at the time of interpretation on 02/12/2021 at 11:42 pm to provider [facility] , who verbally acknowledged these results. Electronically Signed   By: Rise Mu M.D.   On: 02/13/2021 00:18   DG Shoulder Right Port  Result Date: 02/13/2021 CLINICAL DATA:  Syncope, fall, right shoulder pain EXAM: PORTABLE RIGHT SHOULDER COMPARISON:  None. FINDINGS: No glenohumeral dislocation. No evidence of acromioclavicular separation. Mild acromioclavicular joint osteoarthritis. No significant glenohumeral joint arthropathy. No fracture. No focal osseous lesions. No radiopaque foreign bodies or pathologic soft tissue calcifications. IMPRESSION: No right shoulder fracture or malalignment. Mild right AC joint osteoarthritis. Electronically Signed   By: Delbert Phenix M.D.   On: 02/13/2021 14:30   ECHOCARDIOGRAM  COMPLETE  Result Date: 02/13/2021    ECHOCARDIOGRAM REPORT   Patient Name:   Danny Lowe Date of Exam: 02/13/2021 Medical Rec #:  [id number]         Height:       69.0 in Accession #:    [id number]        Weight:       205.0 lb Date of Birth:  May 17, 1971        BSA:          2.088 m Patient Age:    49 years          BP:           111/82 mmHg Patient Gender: M                 HR:           82 bpm. Exam Location:  Inpatient Procedure: 2D Echo, 3D Echo, Cardiac Doppler, Color Doppler and Strain Analysis Indications:    Pulmonary Embolus  History:        Patient has no prior history of Echocardiogram examinations.  Sonographer:    Neomia Dear RDCS Referring Phys: [id number] Ivor Costa MEIER IMPRESSIONS  1. Left ventricular ejection fraction, by estimation, is 60 to 65%. The left ventricle has normal function. The left ventricle has no regional wall motion abnormalities. Left ventricular diastolic parameters are indeterminate. There is the interventricular septum is flattened  in diastole ('D' shaped left ventricle), consistent with right ventricular volume overload.  2. The RV apex is not seen very well but in some views, the apex can be seen to contract fairly well with severe dilatation and hypocontractility of the mid-basal RV ( McConnells sign). This is consistent with RV strain from pulmonary embolus. . . Right  ventricular systolic function is moderately reduced. The right ventricular size is moderately enlarged. There is mildly elevated pulmonary artery systolic pressure. The estimated right ventricular systolic pressure is 38.9 mmHg.  3. Right atrial size was mildly dilated.  4. The mitral valve is normal in structure. Trivial mitral valve regurgitation.  5. The aortic valve is normal in structure. Aortic valve regurgitation is not visualized. No aortic stenosis is present. FINDINGS  Left Ventricle: Left ventricular ejection fraction, by estimation, is 60 to 65%. The left ventricle has normal function. The  left ventricle has no regional wall motion abnormalities. The left ventricular internal cavity size was normal in size. There is  no left ventricular hypertrophy. The interventricular septum is flattened in diastole ('D' shaped left ventricle), consistent with right ventricular volume overload. Left ventricular diastolic parameters are indeterminate. Right Ventricle: The RV apex is not seen very well but in some views, the apex can be seen to contract fairly well with severe dilatation and hypocontractility of the mid-basal RV ( McConnells sign). This is consistent with RV strain from pulmonary embolus. The right ventricular size is moderately enlarged. Right vetricular wall thickness was not well visualized. Right ventricular systolic function is moderately reduced. There is mildly elevated pulmonary artery systolic pressure. The tricuspid regurgitant velocity is 2.78 m/s, and with an assumed right atrial pressure of 8 mmHg, the estimated right ventricular systolic pressure is 38.9 mmHg. Left Atrium: Left atrial size was normal in size. Right Atrium: Right atrial size was mildly dilated. Pericardium: There is no evidence of pericardial effusion. Mitral Valve: The mitral valve is normal in structure. Trivial mitral valve regurgitation. MV peak gradient, 2.4 mmHg. The mean mitral valve gradient is 1.0 mmHg. Tricuspid Valve: The tricuspid valve is grossly normal. Tricuspid valve regurgitation is trivial. Aortic Valve: The aortic valve is normal in structure. Aortic valve regurgitation is not visualized. No aortic stenosis is present. Aortic valve mean gradient measures 3.0 mmHg. Aortic valve peak gradient measures 4.8 mmHg. Aortic valve area, by VTI measures 3.24 cm. Pulmonic Valve: The pulmonic valve was normal in structure. Pulmonic valve regurgitation is not visualized. Aorta: The aortic root and ascending aorta are structurally normal, with no evidence of dilitation. IAS/Shunts: The atrial septum is grossly  normal.  LEFT VENTRICLE PLAX 2D LVIDd:         3.70 cm      Diastology LVIDs:         2.60 cm      LV e' medial:    5.11 cm/s LV PW:         1.40 cm      LV E/e' medial:  11.6 LV IVS:        2.00 cm      LV e' lateral:   5.33 cm/s LVOT diam:     2.40 cm      LV E/e' lateral: 11.1 LV SV:         54 LV SV Index:   26 LVOT Area:     4.52 cm  LV Volumes (MOD) LV vol d, MOD A2C: 84.0 ml LV vol d, MOD A4C: 112.0 ml LV vol s, MOD  A2C: 34.6 ml LV vol s, MOD A4C: 36.2 ml LV SV MOD A2C:     49.4 ml LV SV MOD A4C:     112.0 ml LV SV MOD BP:      62.8 ml RIGHT VENTRICLE RV S prime:     10.90 cm/s  PULMONARY VEINS TAPSE (M-mode): 1.2 cm      A Reversal Duration: 79.00 msec                             A Reversal Velocity: 20.40 cm/s                             Diastolic Velocity:  33.70 cm/s                             S/D Velocity:        1.30                             Systolic Velocity:   43.40 cm/s LEFT ATRIUM             Index       RIGHT ATRIUM           Index LA diam:        2.40 cm 1.15 cm/m  RA Area:     20.60 cm LA Vol (A2C):   20.5 ml 9.82 ml/m  RA Volume:   65.90 ml  31.56 ml/m LA Vol (A4C):   41.1 ml 19.68 ml/m LA Biplane Vol: 30.7 ml 14.70 ml/m  AORTIC VALVE                   PULMONIC VALVE AV Area (Vmax):    3.62 cm    PV Vmax:       0.65 m/s AV Area (Vmean):   3.58 cm    PV Peak grad:  1.7 mmHg AV Area (VTI):     3.24 cm AV Vmax:           110.00 cm/s AV Vmean:          77.700 cm/s AV VTI:            0.166 m AV Peak Grad:      4.8 mmHg AV Mean Grad:      3.0 mmHg LVOT Vmax:         87.90 cm/s LVOT Vmean:        61.500 cm/s LVOT VTI:          0.119 m LVOT/AV VTI ratio: 0.72  AORTA Ao Root diam: 3.30 cm Ao Asc diam:  2.90 cm MITRAL VALVE               TRICUSPID VALVE MV Area (PHT): 4.57 cm    TR Peak grad:   30.9 mmHg MV Area VTI:   3.22 cm    TR Vmax:        278.00 cm/s MV Peak grad:  2.4 mmHg MV Mean grad:  1.0 mmHg    SHUNTS MV Vmax:       0.77 m/s    Systemic VTI:  0.12 m MV Vmean:      48.3 cm/s    Systemic Diam: 2.40 cm MV Decel Time: 166 msec MV E velocity: 59.10 cm/s MV A velocity: 69.80  cm/s MV E/A ratio:  0.85 Kristeen MissPhilip Nahser MD Electronically signed by Kristeen MissPhilip Nahser MD Signature Date/Time: 02/13/2021/3:21:59 PM    Final    VAS US LOWER EXTREMITY VENOUS (DVT)  Result Date: 02/13/2021  Lower Venous DVT Study Indications: SOB, and Massive pulmonary embolism, syncope. Thrombus noted in IVC by CT scan.  Risk Factors: ETOH abuse. Comparison Study: No prior study Performing Technologist: Sherren Kernsandace Kanady RVS  Examination Guidelines: A complete evaluation includes B-mode imaging, spectral Doppler, color Doppler, and power Doppler as needed of all accessible portions of each vessel. Bilateral testing is considered an integral part of a complete examination. Limited examinations for reoccurring indications may be performed as noted. The reflux portion of the exam is performed with the patient in reverse Trendelenburg.  +---------+---------------+---------+-----------+----------+--------------+ RIGHT    CompressibilityPhasicitySpontaneityPropertiesThrombus Aging +---------+---------------+---------+-----------+----------+--------------+ CFV      Full           Yes      Yes                  sluggish flow  +---------+---------------+---------+-----------+----------+--------------+ SFJ      Full                                                        +---------+---------------+---------+-----------+----------+--------------+ FV Prox  Full           Yes      Yes                  sluggish flow  +---------+---------------+---------+-----------+----------+--------------+ FV Mid   Full                                                        +---------+---------------+---------+-----------+----------+--------------+ FV DistalFull                                                        +---------+---------------+---------+-----------+----------+--------------+ PFV      Full                                                         +---------+---------------+---------+-----------+----------+--------------+ POP      Full           No       No                   sluggish flow  +---------+---------------+---------+-----------+----------+--------------+ PTV      Full                                                        +---------+---------------+---------+-----------+----------+--------------+ PERO     Full                                                        +---------+---------------+---------+-----------+----------+--------------+   +---------+---------------+---------+-----------+----------+--------------+  LEFT     CompressibilityPhasicitySpontaneityPropertiesThrombus Aging +---------+---------------+---------+-----------+----------+--------------+ CFV      Full           Yes      Yes                                 +---------+---------------+---------+-----------+----------+--------------+ SFJ      Full                                                        +---------+---------------+---------+-----------+----------+--------------+ FV Prox  Partial        No       No                   Acute          +---------+---------------+---------+-----------+----------+--------------+ FV Mid   Partial        No       No                   Acute          +---------+---------------+---------+-----------+----------+--------------+ FV DistalPartial        No       No                   Acute          +---------+---------------+---------+-----------+----------+--------------+ PFV      Full                                                        +---------+---------------+---------+-----------+----------+--------------+ POP      Partial        No       No                   Acute          +---------+---------------+---------+-----------+----------+--------------+ PTV      None                                                         +---------+---------------+---------+-----------+----------+--------------+ PERO     None                                                        +---------+---------------+---------+-----------+----------+--------------+     Summary: RIGHT: - There is no evidence of deep vein thrombosis in the lower extremity.  LEFT: - Findings consistent with acute deep vein thrombosis involving the left femoral vein, left popliteal vein, left posterior tibial veins, and left peroneal veins.  *See table(s) above for measurements and observations. Electronically signed by Lemar Livings MD on 02/13/2021 at 6:01:09 PM.    Final    CT Maxillofacial Wo Contrast  Result Date: 02/13/2021 CLINICAL DATA:  Initial evaluation for acute trauma, fall. EXAM:  CT MAXILLOFACIAL WITHOUT CONTRAST TECHNIQUE: Multidetector CT imaging of the maxillofacial structures was performed. Multiplanar CT image reconstructions were also generated. COMPARISON:  None. FINDINGS: Osseous: Zygomatic arches intact. No acute maxillary fracture. Pterygoid plates intact. Nasal bones intact. Nasal septum midline and intact. Mandible intact. Individual condyles normally situated. No acute abnormality about the dentition. Orbits: Globes normal soft tissues within normal limits. Bony orbits intact. Sinuses: Paranasal sinuses are clear. Soft tissues: No appreciable soft tissue injury about the face. Few scattered subtle foci of soft tissue emphysema favored to lie within the venous system, likely related IV access. Limited intracranial: Unremarkable. IMPRESSION: No acute maxillofacial injury identified. No fracture. Electronically Signed   By: Rise Mu M.D.   On: 02/13/2021 00:31    Disposition:  Home     Allergies as of 02/15/2021   No Known Allergies     Medication List    TAKE these medications   apixaban 5 MG Tabs tablet Commonly known as: ELIQUIS Take 2 tablets (10 mg total) by mouth 2 (two) times daily.   apixaban 5 MG Tabs  tablet Commonly known as: ELIQUIS Take 1 tablet (5 mg total) by mouth 2 (two) times daily. Start taking on: February 21, 2021   traZODone 100 MG tablet Commonly known as: DESYREL Take 100 mg by mouth at bedtime as needed for sleep.        Follow-up appointment   Patient to establish PCP upon discharge   Discharge Condition:   Good  Signature  Chilton Greathouse MD [facility] See Amion for pager  If no response to pager , please call [phone] until 7pm After 7:00 pm call Elink  [phone] 02/15/2021, 10:17 AM

## 2021-02-14 NOTE — Progress Notes (Signed)
ANTICOAGULATION CONSULT NOTE - Follow Up Consult  Pharmacy Consult for Heparin Indication: pulmonary embolus  No Known Allergies  Patient Measurements: Height: 5\' 9"  (175.3 cm) Weight: 93 kg (205 lb 0.4 oz) IBW/kg (Calculated) : 70.7 Heparin Dosing Weight: 91.1 kg  Vital Signs: BP: 127/82 (03/18 0600) Pulse Rate: 79 (03/18 0600)  Labs: Recent Labs    02/12/21 2200 02/12/21 2230 02/12/21 2358 02/13/21 0002 02/13/21 0007 02/13/21 0624 02/13/21 1514 02/13/21 2238 02/14/21 0531  HGB  --   --   --   --   --  15.4 15.9  --  15.4  HCT  --   --   --   --   --  45.9 46.4  --  45.3  PLT  --   --  122*  --   --  149* 133*  --  146*  APTT  --   --  21*  --   --  48*  --   --   --   LABPROT  --   --  14.7  --   --  20.1*  --   --   --   INR  --   --  1.2  --   --  1.8*  --   --   --   HEPARINUNFRC  --   --   --   --   --   --  <0.10* 0.11* 0.42  CREATININE  --  1.32*  --   --   --  1.32*  --   --  1.18  CKTOTAL  --   --   --   --  241  --   --   --   --   TROPONINIHS 108*  --   --  222*  --   --   --   --   --     Estimated Creatinine Clearance: 85.3 mL/min (by C-G formula based on SCr of 1.18 mg/dL).   Assessment: 50 year old male who was admitted for extensive acute PE involving bilateral mainstem and proximal lobar pulmonary arteries with evidence of right heart strain. IV heparin was started, then held for total of 50 mg alteplase given at 0200 on 3/17. No anticoagulation prior to admission.  aPTT 4 hours after alteplase is < 80 so appropriate to resume heparin infusion.  Heparin level is therapeutic (0.42) on gtt at 1,700 units/hr. No issues with line or bleeding reported per RN and CBC is stable.  Goal of Therapy:  Heparin level 0.3-0.5 units/ml for 24 hours until 3/18 0900, then 0.3-0.7 units/ml  Monitor platelets by anticoagulation protocol: Yes   Plan:  Continue heparin drip at 1,700 units/hr Check confirmatory heparin level in 6 hours, then transition to daily  levels Monitor daily CBC, s/sx bleeding Follow up with transition to PO anticoagulation  4/18, PharmD PGY1 Acute Care Pharmacy Resident Please refer to [facility] for unit-specific pharmacist

## 2021-02-14 NOTE — Telephone Encounter (Signed)
HFU scheduled with Dr. Judeth Horn for PE on 03/24 at 10:30

## 2021-02-15 ENCOUNTER — Inpatient Hospital Stay (HOSPITAL_COMMUNITY)
Admission: EM | Admit: 2021-02-15 | Discharge: 2021-02-20 | Disposition: A | Discharge status: Home / Self Care | Payer: 59 | Source: Home / Self Care | Attending: Internal Medicine | Admitting: Internal Medicine

## 2021-02-15 ENCOUNTER — Inpatient Hospital Stay (HOSPITAL_COMMUNITY): Payer: 59

## 2021-02-15 ENCOUNTER — Emergency Department (HOSPITAL_COMMUNITY): Payer: 59

## 2021-02-15 ENCOUNTER — Telehealth: Payer: Self-pay | Admitting: Pulmonary Disease

## 2021-02-15 DIAGNOSIS — J9602 Acute respiratory failure with hypercapnia: Secondary | ICD-10-CM | POA: Diagnosis present

## 2021-02-15 DIAGNOSIS — E785 Hyperlipidemia, unspecified: Secondary | ICD-10-CM | POA: Diagnosis present

## 2021-02-15 DIAGNOSIS — I82412 Acute embolism and thrombosis of left femoral vein: Secondary | ICD-10-CM | POA: Diagnosis present

## 2021-02-15 DIAGNOSIS — J9601 Acute respiratory failure with hypoxia: Secondary | ICD-10-CM | POA: Diagnosis present

## 2021-02-15 DIAGNOSIS — T45525A Adverse effect of antithrombotic drugs, initial encounter: Secondary | ICD-10-CM | POA: Diagnosis present

## 2021-02-15 DIAGNOSIS — I611 Nontraumatic intracerebral hemorrhage in hemisphere, cortical: Secondary | ICD-10-CM | POA: Diagnosis present

## 2021-02-15 DIAGNOSIS — I619 Nontraumatic intracerebral hemorrhage, unspecified: Secondary | ICD-10-CM | POA: Diagnosis not present

## 2021-02-15 DIAGNOSIS — K5900 Constipation, unspecified: Secondary | ICD-10-CM | POA: Diagnosis not present

## 2021-02-15 DIAGNOSIS — Z20822 Contact with and (suspected) exposure to covid-19: Secondary | ICD-10-CM | POA: Diagnosis present

## 2021-02-15 DIAGNOSIS — I2699 Other pulmonary embolism without acute cor pulmonale: Secondary | ICD-10-CM | POA: Diagnosis present

## 2021-02-15 DIAGNOSIS — J9690 Respiratory failure, unspecified, unspecified whether with hypoxia or hypercapnia: Secondary | ICD-10-CM

## 2021-02-15 DIAGNOSIS — D6832 Hemorrhagic disorder due to extrinsic circulating anticoagulants: Secondary | ICD-10-CM | POA: Diagnosis present

## 2021-02-15 DIAGNOSIS — Y900 Blood alcohol level of less than 20 mg/100 ml: Secondary | ICD-10-CM | POA: Diagnosis present

## 2021-02-15 DIAGNOSIS — Z79899 Other long term (current) drug therapy: Secondary | ICD-10-CM

## 2021-02-15 DIAGNOSIS — F17220 Nicotine dependence, chewing tobacco, uncomplicated: Secondary | ICD-10-CM | POA: Diagnosis present

## 2021-02-15 DIAGNOSIS — E876 Hypokalemia: Secondary | ICD-10-CM | POA: Diagnosis not present

## 2021-02-15 DIAGNOSIS — R29715 NIHSS score 15: Secondary | ICD-10-CM | POA: Diagnosis present

## 2021-02-15 DIAGNOSIS — F32A Depression, unspecified: Secondary | ICD-10-CM | POA: Diagnosis present

## 2021-02-15 DIAGNOSIS — I959 Hypotension, unspecified: Secondary | ICD-10-CM | POA: Diagnosis present

## 2021-02-15 DIAGNOSIS — F4310 Post-traumatic stress disorder, unspecified: Secondary | ICD-10-CM | POA: Diagnosis present

## 2021-02-15 DIAGNOSIS — Z6832 Body mass index (BMI) 32.0-32.9, adult: Secondary | ICD-10-CM

## 2021-02-15 DIAGNOSIS — I82621 Acute embolism and thrombosis of deep veins of right upper extremity: Secondary | ICD-10-CM | POA: Diagnosis not present

## 2021-02-15 DIAGNOSIS — F10131 Alcohol abuse with withdrawal delirium: Secondary | ICD-10-CM | POA: Diagnosis not present

## 2021-02-15 DIAGNOSIS — I82442 Acute embolism and thrombosis of left tibial vein: Secondary | ICD-10-CM | POA: Diagnosis present

## 2021-02-15 DIAGNOSIS — G40901 Epilepsy, unspecified, not intractable, with status epilepticus: Secondary | ICD-10-CM

## 2021-02-15 LAB — I-STAT ARTERIAL BLOOD GAS, ED
Acid-Base Excess: 0 mmol/L (ref 0.0–2.0)
Bicarbonate: 29.3 mmol/L — ABNORMAL HIGH (ref 20.0–28.0)
Calcium, Ion: 1.23 mmol/L (ref 1.15–1.40)
HCT: 46 % (ref 39.0–52.0)
Hemoglobin: 15.6 g/dL (ref 13.0–17.0)
O2 Saturation: 96 %
Patient temperature: 98
Potassium: 4.2 mmol/L (ref 3.5–5.1)
Sodium: 139 mmol/L (ref 135–145)
TCO2: 31 mmol/L (ref 22–32)
pCO2 arterial: 65.5 mmHg (ref 32.0–48.0)
pH, Arterial: 7.257 — ABNORMAL LOW (ref 7.350–7.450)
pO2, Arterial: 97 mmHg (ref 83.0–108.0)

## 2021-02-15 LAB — COMPREHENSIVE METABOLIC PANEL
ALT: 32 U/L (ref 0–44)
ALT: 34 U/L (ref 0–44)
AST: 37 U/L (ref 15–41)
AST: 40 U/L (ref 15–41)
Albumin: 2.3 g/dL — ABNORMAL LOW (ref 3.5–5.0)
Albumin: 3 g/dL — ABNORMAL LOW (ref 3.5–5.0)
Alkaline Phosphatase: 39 U/L (ref 38–126)
Alkaline Phosphatase: 40 U/L (ref 38–126)
Anion gap: 7 (ref 5–15)
Anion gap: 10 (ref 5–15)
BUN: 6 mg/dL (ref 6–20)
BUN: 6 mg/dL (ref 6–20)
CO2: 25 mmol/L (ref 22–32)
CO2: 25 mmol/L (ref 22–32)
Calcium: 8.2 mg/dL — ABNORMAL LOW (ref 8.9–10.3)
Calcium: 8.9 mg/dL (ref 8.9–10.3)
Chloride: 103 mmol/L (ref 98–111)
Chloride: 102 mmol/L (ref 98–111)
Creatinine, Ser: 1.15 mg/dL (ref 0.61–1.24)
Creatinine, Ser: 1.25 mg/dL — ABNORMAL HIGH (ref 0.61–1.24)
GFR, Estimated: >60 mL/min (ref >60)
GFR, Estimated: >60 mL/min (ref >60)
Glucose, Bld: 93 mg/dL (ref 70–99)
Glucose, Bld: 146 mg/dL — ABNORMAL HIGH (ref 70–99)
Potassium: 3.9 mmol/L (ref 3.5–5.1)
Potassium: 3.9 mmol/L (ref 3.5–5.1)
Sodium: 135 mmol/L (ref 135–145)
Sodium: 137 mmol/L (ref 135–145)
Total Bilirubin: 0.8 mg/dL (ref 0.3–1.2)
Total Bilirubin: 0.6 mg/dL (ref 0.3–1.2)
Total Protein: 5.6 g/dL — ABNORMAL LOW (ref 6.5–8.1)
Total Protein: 7.2 g/dL (ref 6.5–8.1)

## 2021-02-15 LAB — DIFFERENTIAL
Abs Immature Granulocytes: 0.05 10*3/uL (ref 0.00–0.07)
Basophils Absolute: 0.1 10*3/uL (ref 0.0–0.1)
Basophils Relative: 1 %
Eosinophils Absolute: 0.1 10*3/uL (ref 0.0–0.5)
Eosinophils Relative: 1 %
Immature Granulocytes: 1 %
Lymphocytes Relative: 16 %
Lymphs Abs: 1.7 10*3/uL (ref 0.7–4.0)
Monocytes Absolute: 0.8 10*3/uL (ref 0.1–1.0)
Monocytes Relative: 8 %
Neutro Abs: 8.1 10*3/uL — ABNORMAL HIGH (ref 1.7–7.7)
Neutrophils Relative %: 73 %

## 2021-02-15 LAB — RAPID URINE DRUG SCREEN, HOSP PERFORMED
Amphetamines: NOT DETECTED
Barbiturates: NOT DETECTED
Benzodiazepines: NOT DETECTED
Cocaine: NOT DETECTED
Opiates: NOT DETECTED
Tetrahydrocannabinol: POSITIVE — AB

## 2021-02-15 LAB — CBC
HCT: 43.9 % (ref 39.0–52.0)
HCT: 50.2 % (ref 39.0–52.0)
Hemoglobin: 15.1 g/dL (ref 13.0–17.0)
Hemoglobin: 16.3 g/dL (ref 13.0–17.0)
MCH: 33.8 pg (ref 26.0–34.0)
MCH: 33.2 pg (ref 26.0–34.0)
MCHC: 34.4 g/dL (ref 30.0–36.0)
MCHC: 32.5 g/dL (ref 30.0–36.0)
MCV: 98.2 fL (ref 80.0–100.0)
MCV: 102.2 fL — ABNORMAL HIGH (ref 80.0–100.0)
Platelets: 150 10*3/uL (ref 150–400)
Platelets: 182 10*3/uL (ref 150–400)
RBC: 4.47 MIL/uL (ref 4.22–5.81)
RBC: 4.91 MIL/uL (ref 4.22–5.81)
RDW: 14.6 % (ref 11.5–15.5)
RDW: 14.6 % (ref 11.5–15.5)
WBC: 8.2 10*3/uL (ref 4.0–10.5)
WBC: 10.8 10*3/uL — ABNORMAL HIGH (ref 4.0–10.5)
nRBC: 0 % (ref 0.0–0.2)
nRBC: 0 % (ref 0.0–0.2)

## 2021-02-15 LAB — SAMPLE TO BLOOD BANK

## 2021-02-15 LAB — URINALYSIS, ROUTINE W REFLEX MICROSCOPIC
Bacteria, UA: NONE SEEN
Bilirubin Urine: NEGATIVE
Glucose, UA: NEGATIVE mg/dL
Hgb urine dipstick: NEGATIVE
Ketones, ur: NEGATIVE mg/dL
Nitrite: NEGATIVE
Protein, ur: NEGATIVE mg/dL
Specific Gravity, Urine: 1.019 (ref 1.005–1.030)
pH: 5 (ref 5.0–8.0)

## 2021-02-15 LAB — I-STAT CHEM 8, ED
BUN: 6 mg/dL (ref 6–20)
Calcium, Ion: 1.14 mmol/L — ABNORMAL LOW (ref 1.15–1.40)
Chloride: 102 mmol/L (ref 98–111)
Creatinine, Ser: 1.1 mg/dL (ref 0.61–1.24)
Glucose, Bld: 149 mg/dL — ABNORMAL HIGH (ref 70–99)
HCT: 51 % (ref 39.0–52.0)
Hemoglobin: 17.3 g/dL — ABNORMAL HIGH (ref 13.0–17.0)
Potassium: 3.8 mmol/L (ref 3.5–5.1)
Sodium: 137 mmol/L (ref 135–145)
TCO2: 25 mmol/L (ref 22–32)

## 2021-02-15 LAB — GLUCOSE, CAPILLARY: Glucose-Capillary: 72 mg/dL (ref 70–99)

## 2021-02-15 LAB — RESP PANEL BY RT-PCR (FLU A&B, COVID) ARPGX2
Influenza A by PCR: NEGATIVE
Influenza B by PCR: NEGATIVE
SARS Coronavirus 2 by RT PCR: NEGATIVE

## 2021-02-15 LAB — PROTIME-INR
INR: 1.3 — ABNORMAL HIGH (ref 0.8–1.2)
Prothrombin Time: 15.5 seconds — ABNORMAL HIGH (ref 11.4–15.2)

## 2021-02-15 LAB — ETHANOL: Alcohol, Ethyl (B): <10 mg/dL (ref <10)

## 2021-02-15 LAB — TROPONIN I (HIGH SENSITIVITY)
Troponin I (High Sensitivity): 27 ng/L — ABNORMAL HIGH (ref <18)
Troponin I (High Sensitivity): 27 ng/L — ABNORMAL HIGH (ref <18)

## 2021-02-15 LAB — CBG MONITORING, ED: Glucose-Capillary: 149 mg/dL — ABNORMAL HIGH (ref 70–99)

## 2021-02-15 LAB — APTT: aPTT: 24 seconds (ref 24–36)

## 2021-02-15 MED ORDER — LEVETIRACETAM IN NACL 1500 MG/100ML IV SOLN
1500.0000 mg | Freq: Two times a day (BID) | INTRAVENOUS | Status: DC
  Administered 2021-02-16 – 2021-02-20 (×9): 1500 mg via INTRAVENOUS
  Filled 2021-02-15 (×11): qty 100

## 2021-02-15 MED ORDER — LORAZEPAM 2 MG/ML IJ SOLN
2.0000 mg | Freq: Once | INTRAMUSCULAR | Status: AC
  Administered 2021-02-15: 2 mg via INTRAVENOUS
  Filled 2021-02-15: qty 1

## 2021-02-15 MED ORDER — FENTANYL 2500MCG IN NS 250ML (10MCG/ML) PREMIX INFUSION
0.0000 ug/h | INTRAVENOUS | Status: DC
  Administered 2021-02-15: 50 ug/h via INTRAVENOUS
  Administered 2021-02-16: 100 ug/h via INTRAVENOUS
  Filled 2021-02-15 (×3): qty 250

## 2021-02-15 MED ORDER — MIDAZOLAM HCL 2 MG/2ML IJ SOLN
2.0000 mg | INTRAMUSCULAR | Status: DC | PRN
  Filled 2021-02-15: qty 2

## 2021-02-15 MED ORDER — PROTHROMBIN COMPLEX CONC HUMAN 500 UNITS IV KIT
4704.0000 [IU] | PACK | Status: AC
  Administered 2021-02-15: 4704 [IU] via INTRAVENOUS
  Filled 2021-02-15: qty 4204

## 2021-02-15 MED ORDER — LORAZEPAM 2 MG/ML IJ SOLN
INTRAMUSCULAR | Status: AC
  Filled 2021-02-15: qty 1

## 2021-02-15 MED ORDER — SUCCINYLCHOLINE CHLORIDE 20 MG/ML IJ SOLN
INTRAMUSCULAR | Status: AC | PRN
  Administered 2021-02-15: 100 mg via INTRAVENOUS

## 2021-02-15 MED ORDER — SODIUM CHLORIDE 0.9% FLUSH: 3.0000 mL | Freq: Once | INTRAVENOUS | Status: DC

## 2021-02-15 MED ORDER — MIDAZOLAM HCL 2 MG/2ML IJ SOLN: 2.0000 mg | INTRAMUSCULAR | Status: DC | PRN

## 2021-02-15 MED ORDER — IOHEXOL 300 MG/ML  SOLN
100.0000 mL | Freq: Once | INTRAMUSCULAR | Status: AC | PRN
  Administered 2021-02-15: 100 mL via INTRAVENOUS

## 2021-02-15 MED ORDER — CLEVIDIPINE BUTYRATE 0.5 MG/ML IV EMUL: 0.0000 mg/h | INTRAVENOUS | Status: DC | PRN

## 2021-02-15 MED ORDER — FENTANYL BOLUS VIA INFUSION
50.0000 ug | INTRAVENOUS | Status: DC | PRN
  Administered 2021-02-15 – 2021-02-17 (×7): 50 ug via INTRAVENOUS
  Filled 2021-02-15: qty 50

## 2021-02-15 MED ORDER — LEVETIRACETAM IN NACL 1500 MG/100ML IV SOLN
1500.0000 mg | Freq: Once | INTRAVENOUS | Status: AC
  Administered 2021-02-15: 1500 mg via INTRAVENOUS
  Filled 2021-02-15: qty 100

## 2021-02-15 MED ORDER — DOCUSATE SODIUM 100 MG PO CAPS
100.0000 mg | ORAL_CAPSULE | Freq: Two times a day (BID) | ORAL | Status: DC | PRN
Start: 2021-02-15 — End: 2021-02-17

## 2021-02-15 MED ORDER — MIDAZOLAM BOLUS VIA INFUSION
1.0000 mg | INTRAVENOUS | Status: DC | PRN
  Administered 2021-02-16 – 2021-02-17 (×4): 2 mg via INTRAVENOUS
  Filled 2021-02-15: qty 2

## 2021-02-15 MED ORDER — FENTANYL CITRATE (PF) 100 MCG/2ML IJ SOLN
50.0000 ug | Freq: Once | INTRAMUSCULAR | Status: AC
  Administered 2021-02-15: 50 ug via INTRAVENOUS

## 2021-02-15 MED ORDER — PANTOPRAZOLE SODIUM 40 MG IV SOLR
40.0000 mg | Freq: Every day | INTRAVENOUS | Status: DC
  Administered 2021-02-15 – 2021-02-16 (×2): 40 mg via INTRAVENOUS
  Filled 2021-02-15: qty 40

## 2021-02-15 MED ORDER — LEVETIRACETAM IN NACL 1000 MG/100ML IV SOLN
1000.0000 mg | Freq: Once | INTRAVENOUS | Status: AC
  Administered 2021-02-15: 1000 mg via INTRAVENOUS
  Filled 2021-02-15: qty 100

## 2021-02-15 MED ORDER — MIDAZOLAM 50MG/50ML (1MG/ML) PREMIX INFUSION
0.0000 mg/h | INTRAVENOUS | Status: DC
  Administered 2021-02-15: 10 mg/h via INTRAVENOUS
  Administered 2021-02-15: 8 mg/h via INTRAVENOUS
  Administered 2021-02-16: 7 mg/h via INTRAVENOUS
  Administered 2021-02-16: 8 mg/h via INTRAVENOUS
  Administered 2021-02-16 (×2): 7 mg/h via INTRAVENOUS
  Filled 2021-02-15 (×2): qty 50
  Filled 2021-02-15 (×2): qty 100
  Filled 2021-02-15: qty 50

## 2021-02-15 MED ORDER — MIDAZOLAM 50MG/50ML (1MG/ML) PREMIX INFUSION
0.0000 mg/h | INTRAVENOUS | Status: DC
  Administered 2021-02-15: 2 mg/h via INTRAVENOUS
  Filled 2021-02-15: qty 50

## 2021-02-15 MED ORDER — SODIUM CHLORIDE 0.9 % IV SOLN: 4000.0000 mg | Freq: Once | INTRAVENOUS | Status: DC

## 2021-02-15 MED ORDER — DOCUSATE SODIUM 50 MG/5ML PO LIQD
100.0000 mg | Freq: Two times a day (BID) | ORAL | Status: DC
  Administered 2021-02-15 – 2021-02-16 (×3): 100 mg
  Filled 2021-02-15 (×2): qty 10

## 2021-02-15 MED ORDER — POLYETHYLENE GLYCOL 3350 17 G PO PACK: 17.0000 g | PACK | Freq: Every day | ORAL | Status: DC | PRN

## 2021-02-15 MED ORDER — ETOMIDATE 2 MG/ML IV SOLN
INTRAVENOUS | Status: AC | PRN
  Administered 2021-02-15: 20 mg via INTRAVENOUS

## 2021-02-15 MED ORDER — POLYETHYLENE GLYCOL 3350 17 G PO PACK
17.0000 g | PACK | Freq: Every day | ORAL | Status: DC
  Administered 2021-02-16: 17 g
  Filled 2021-02-15: qty 1

## 2021-02-15 MED ORDER — APIXABAN 5 MG PO TABS: 10.0000 mg | ORAL_TABLET | Freq: Two times a day (BID) | ORAL | 0 refills | Status: DC

## 2021-02-15 MED ORDER — APIXABAN 5 MG PO TABS: 5.0000 mg | ORAL_TABLET | Freq: Two times a day (BID) | ORAL | 0 refills | Status: DC

## 2021-02-15 MED ORDER — MIDAZOLAM BOLUS VIA INFUSION
1.0000 mg | INTRAVENOUS | Status: DC | PRN
  Administered 2021-02-15: 2 mg via INTRAVENOUS
  Filled 2021-02-15: qty 2

## 2021-02-15 MED ORDER — LORAZEPAM 2 MG/ML IJ SOLN
2.0000 mg | Freq: Once | INTRAMUSCULAR | Status: AC
  Administered 2021-02-15: 2 mg via INTRAVENOUS

## 2021-02-15 NOTE — ED Notes (Signed)
EEG at bedside.

## 2021-02-15 NOTE — Progress Notes (Signed)
RT NOTE:  Pt transported to CT & 4N26 without event. Report given to Will, RRT.

## 2021-02-15 NOTE — Progress Notes (Signed)
Patient belongings at bedside: One set of keys, a brown wallet, a pair of shredded jeans, and a cut belt. Belongs placed in belonging bag in patient closet.

## 2021-02-15 NOTE — Code Documentation (Addendum)
Code stroke called on this patient because he had left sided weakness.  The patient had just been discharged from [facility] 02/15/21.  He was found down in a [employer] parking lot after taking his apixiban.  When patient arrived he was actively seizing.  Ativan 2 mg given at bridge.  Neurology was unable to get an accurate NIH on patient.  While in CT patient became unresponsive and he was rushed back to his room.  Patient was intubated for airway protection.  LKW was 1128.  Treatment decision made in CT at 1504 because patient has already taken anticoagulation.  CT showed ICH and blood pressure control was ordered

## 2021-02-15 NOTE — Progress Notes (Signed)
Attempted EEG, pt agitated/restless, will re- attempt later.

## 2021-02-15 NOTE — Care Management (Signed)
Went to bedside to give patient Eliquis copay and 30-day trial cards. No further needs assessed.   Raiford Noble, MSN, RN,BSN [facility] Case Manager [phone]

## 2021-02-15 NOTE — ED Notes (Signed)
Attempted to call report 4 N

## 2021-02-15 NOTE — ED Notes (Signed)
Critical care paged to RN per her request 

## 2021-02-15 NOTE — Consult Note (Addendum)
NEUROLOGY CONSULTATION NOTE   Date of service: February 15, 2021 Patient Name: Danny Lowe MRN:  [id number] DOB:  1971/06/27 Reason for consult: found down in [employer] parking lot, L sided weakness noted by EMS --> stroke code  History of Present Illness   50 year old male with prior history of daily ETOH use, PTSD, depression and recent admit with PE/ DVT found down in [employer] parking lot after discharge.  He was apparently there to fill his Eliquis prescription.  He was admitted to [facility] service on 3/16 after he presented after syncopal event. CTH at the time showed NAICP with contusion noted on R parieto-occipital scalp w/o underlying fracture. He was found to have unprovoked massive PE with right heart strain and extensive left DVT and received tPA. He was transitioned to Eliquis 3/18 and was hemodynamically stable and tolerated physical therapy without hypoxia or complications discharged home today ~1200.   LKW = 1128 when he was seen at neurologic baseline by case manager at discharge. He was found lying facedown in [employer] parking lot unresponsive ~3 hrs later at 1500. EMS noted L sided weakness and called stroke code en route. He was not a tPA candidate 2/2 being on eliquis last dose this AM. CT head showed ICH involving R temporal region (10cc) and R cerebellum (4cc).   LKW: 1128 tpa given?: No, ICH IR Thrombectomy? No, ICH NIHSS: 15 (2 LOC, 1 gaze, 1 facial palsy, 2 for motor in each extremity, 2 language, 1 dysarthria). Patient was clinically seizing through most of the examination. CTH: ICH CTA not performed 2/2 unstable patient  ICH score = 2 on 3/19 @ 1700  Eliquis reversed with Kcentra. SBP maintained <160 without antihypertensives, intermittently hypotensive to 80 systolic while seizing.  Pt developed continuous clinical seizure with unresponsiveness (staring ahead, nonverbal) and rhythmic shaking of his L arm and leg with intermittent spread to R leg. 2mg  IV ativan  administered x2 without suppression. Pt desatted and became hypotensive. He was intubated and started on versed gtt for sedation and seizure control. He was loaded with keppra 40mg /kg and continued on 1500mg  bid.   Admit to ICU under CCM with stroke consulting 2/2 multiple emdical issues including potential complications from eliquis reversal given hx recent PE. I gave verbal sign out to the admitting provider and entered appropriate ICH orders.   ROS   ROS: UTA 2/2 status epilepticus  Past History   Past Medical History:  Diagnosis Date  . Depression    No past surgical history on file. No family history on file. Social History   Socioeconomic History  . Marital status: Single    Spouse name: Not on file  . Number of children: Not on file  . Years of education: Not on file  . Highest education level: Not on file  Occupational History  . Not on file  Tobacco Use  . Smoking status: Never Smoker  . Smokeless tobacco: Current User    Types: Chew  Substance and Sexual Activity  . Alcohol use: Yes    Comment: 12 beers daily  . Drug use: Never  . Sexual activity: Not on file  Other Topics Concern  . Not on file  Social History Narrative  . Not on file   Social Determinants of Health   Financial Resource Strain: Not on file  Food Insecurity: Not on file  Transportation Needs: Not on file  Physical Activity: Not on file  Stress: Not on file  Social Connections: Not on file  No Known Allergies  Medications   See H&P  Vitals   Vitals:   02/15/21 1815 02/15/21 1845 02/15/21 1900 02/15/21 1915  BP: 103/77 107/78 110/77 106/73  Pulse: 86 83 80 80  Resp: (!) 22 (!) 22 (!) 22 18  SpO2: 100% 100% 100% 100%     There is no height or weight on file to calculate BMI.  Physical Exam   Gen: awake but unresponsive CV: RRR Resp: CTAB  NIHSS = 15   Neurologic Exam  MS: intermittently able to answer simple questions until in scanner after which he was awake but  not verbal Speech: dysarthria prior to scanner, after no intelligible speech CN: PERRL 42mm, L gaze preference +oculocephalics, L UMN facial droop, hearing intact to voice Motor: Little effort against gravity LUE and LLE, at least 3/5 throughout on R Sensory: withdraws to noxious stimuli more briskly on R than on L Coordination, gait: deferred  Labs   CBC:  Recent Labs  Lab 02/15/21 0143 02/15/21 1509 02/15/21 1510 02/15/21 1638  WBC 8.2  --  10.8*  --   NEUTROABS  --   --  8.1*  --   HGB 15.1   < > 16.3 15.6  HCT 43.9   < > 50.2 46.0  MCV 98.2  --  102.2*  --   PLT 150  --  182  --    < > = values in this interval not displayed.    Basic Metabolic Panel:  Lab Results  Component Value Date   NA 139 02/15/2021   K 4.2 02/15/2021   CO2 25 02/15/2021   GLUCOSE 146 (H) 02/15/2021   BUN 6 02/15/2021   CREATININE 1.25 (H) 02/15/2021   CALCIUM 8.9 02/15/2021   GFRNONAA >60 02/15/2021   Lipid Panel: No results found for: LDLCALC HgbA1c: No results found for: HGBA1C Urine Drug Screen:     Component Value Date/Time   LABOPIA NONE DETECTED 02/15/2021 1719   COCAINSCRNUR NONE DETECTED 02/15/2021 1719   LABBENZ NONE DETECTED 02/15/2021 1719   AMPHETMU NONE DETECTED 02/15/2021 1719   THCU POSITIVE (A) 02/15/2021 1719   LABBARB NONE DETECTED 02/15/2021 1719    Alcohol Level     Component Value Date/Time   ETH <10 02/15/2021 1701    Impression   50 yo w/ hx recent PE discharged from [facility] today on eliquis BIB EMS after being found down in [employer] parking lot and noted to have L sided weakness. Stroke code activated and CT head revealed 2 ICH in R temporal and R cerebellar regions. Eliquis reversed with Kcentra. He went into clinical status epilepticus and became unstable and is not intubated on versed gtt + LEV.  Recommendations   - Admit to ICU under CCM with stroke consulting 2/2 multiple emdical issues including potential complications from eliquis reversal given hx  recent PE. I gave verbal sign out to the admitting provider and entered appropriate ICH orders. - CT head at 6 hrs assess stability ICH (3/19 @ 2200) - Neurochecks q 1 hr x6 hrs f/b q 2 hrs after that - STAT head CT for any change in exam - STAT routine f/b continuous EEG monitoring - AEDs: Versed gtt, LEV 1500mg  q 12 hrs - Hold eliquis - Post-Kcentra monitoring protocol - Goal SBP < 140, prn clevidipine gtt preferred - Tele - Elevate HOB - Seizure precautions - ICH appearance on his CTH somewhat unusually circumscribed for a traumatic bleed. Query underling mass lesion. Consider MRI brain wwo for  further characterization when stable. - Stroke team will continue to follow  This patient is critically ill and at significant risk of neurological worsening, death and care requires constant monitoring of vital signs, hemodynamics,respiratory and cardiac monitoring, neurological assessment, discussion with family, other specialists and medical decision making of high complexity. I spent 110 minutes of neurocritical care time  in the care of  this patient. This was time spent independent of any time provided by nurse practitioner or PA.  ______________________________________________________________________   Thank you for the opportunity to take part in the care of this patient. If you have any further questions, please contact the neurology consultation attending.  Bing Neighbors, MD [facility] [phone]  If 7pm- 7am, please page neurology on call as listed in AMION.

## 2021-02-15 NOTE — Telephone Encounter (Signed)
Received call from [facility] regarding the patient's prescription.  I called twice and after remaining on hold > 10 minutes the call automatically disconnected.  I then called the patient's cell phone and he did not answer.  I left a detailed message to call back.

## 2021-02-15 NOTE — Progress Notes (Signed)
Pt discharged to home with family.  Pt taken off telemetry and CCMD notified.  Pt did not have an IV. Pt left with all of his personal belongings. AVS documentation reviewed and sent home with Pt and all questions answered.

## 2021-02-15 NOTE — ED Provider Notes (Signed)
I saw and evaluated the patient, reviewed the resident's note and I agree with the findings and plan.    50 year old male presents after being found on the ground in the [facility]store parking lot.  Patient recently diagnosed with PE and is on Eliquis.  Patient called code stroke prehospital and was met by neurologist on arrival.  Patient was confused.  Had seizure activity shortly after arriving to the ER.  Head CT showed bleed.  Patient's airway controlled with endotracheal intubation.  His Eliquis was reversed.  Patient started on antiepileptic and will consult neurosurgery and patient admitted to the ICU  CRITICAL CARE Performed by: Leota Jacobsen Total critical care time: 50 minutes Critical care time was exclusive of separately billable procedures and treating other patients. Critical care was necessary to treat or prevent imminent or life-threatening deterioration. Critical care was time spent personally by me on the following activities: development of treatment plan with patient and/or surrogate as well as nursing, discussions with consultants, evaluation of patient's response to treatment, examination of patient, obtaining history from patient or surrogate, ordering and performing treatments and interventions, ordering and review of laboratory studies, ordering and review of radiographic studies, pulse oximetry and re-evaluation of patient's condition.   Lacretia Leigh, MD 02/15/21 [phone]

## 2021-02-15 NOTE — ED Provider Notes (Signed)
[facility] EMERGENCY DEPARTMENT Provider Note   CSN: [id number] Arrival date & time: 02/15/21  1502  An emergency department physician performed an initial assessment on this suspected stroke patient at 1504.  History Chief Complaint  Patient presents with  . Code Stroke    Danny Lowe is a 50 y.o. male.  HPI Patient is a 50 year old male who presents as a code stroke as he was found down in a pharmacy parking lot.  Per recent history obtained from the chart, patient was recently admitted to our hospital and discharged earlier this morning after diagnosis and treatment of pulmonary embolism.  He was started on Eliquis 3 days ago and has been taking it since.  It is assumed that patient had gone to the pharmacy to get his outpatient Eliquis medications.  It is unclear what happened but patient was found down EMS was called. EMS reports seizure like activity en route with worsening mental status. On arrival he was a code stroke.  While in the CT scanner he had seizure-like activity.  He was not responsive but making small movements spontaneously.  Last dose of Eliquis presumed to be earlier today.  Unclear etiology of patient's presentation.    Past Medical History:  Diagnosis Date  . Depression    Patient Active Problem List   Diagnosis Date Noted  . ICH (intracerebral hemorrhage) (HCC) 02/15/2021  . Pulmonary embolism (HCC) 02/13/2021   No past surgical history on file.   No family history on file.  Social History   Tobacco Use  . Smoking status: Never Smoker  . Smokeless tobacco: Current User    Types: Chew  Substance Use Topics  . Alcohol use: Yes    Comment: 12 beers daily  . Drug use: Never    Home Medications Prior to Admission medications   Medication Sig Start Date End Date Taking? Authorizing Provider  busPIRone (BUSPAR) 10 MG tablet Take 10 mg by mouth daily.   Yes [provider]  hydrOXYzine (VISTARIL) 25 MG capsule Take  25-50 mg by mouth 2 (two) times daily as needed (sleep).   Yes [provider]  sertraline (ZOLOFT) 100 MG tablet Take 150 mg by mouth daily.   Yes [provider]  traZODone (DESYREL) 100 MG tablet Take 100 mg by mouth at bedtime.   Yes [provider]  apixaban (ELIQUIS) 5 MG TABS tablet Take 2 tablets (10 mg total) by mouth 2 (two) times daily. 02/15/21   Mannam, Colbert Coyer, MD  apixaban (ELIQUIS) 5 MG TABS tablet Take 1 tablet (5 mg total) by mouth 2 (two) times daily. 02/21/21 05/22/21  Chilton Greathouse, MD    Allergies    Patient has no known allergies.  Review of Systems   Review of Systems  Unable to perform ROS: Patient unresponsive    Physical Exam Updated Vital Signs BP 100/76 (BP Location: Left Arm)   Pulse 73   Temp 97.6 F (36.4 C) (Axillary)   Resp (!) 22   SpO2 100%   Physical Exam Vitals and nursing note reviewed.  Constitutional:      Appearance: He is well-developed.     Comments: unresponsive  HENT:     Head: Normocephalic and atraumatic.     Right Ear: External ear normal.     Left Ear: External ear normal.     Nose: Nose normal.  Eyes:     General:        Right eye: No discharge.  Left eye: No discharge.     Pupils: Pupils are equal, round, and reactive to light.  Neck:     Comments: c collar in place Cardiovascular:     Rate and Rhythm: Normal rate and regular rhythm.  Pulmonary:     Breath sounds: Normal breath sounds. No rhonchi.     Comments: Equal breath sounds No signs of chest trauma Abdominal:     Palpations: Abdomen is soft.     Tenderness: There is no abdominal tenderness.  Musculoskeletal:        General: No deformity.  Skin:    General: Skin is warm and dry.     Capillary Refill: Capillary refill takes less than 2 seconds.  Neurological:     Mental Status: He is unresponsive.     GCS: GCS eye subscore is 1. GCS verbal subscore is 1. GCS motor subscore is 4.    ED Results / Procedures / Treatments    Labs (all labs ordered are listed, but only abnormal results are displayed) Labs Reviewed  PROTIME-INR - Abnormal; Notable for the following components:      Result Value   Prothrombin Time 15.5 (*)    INR 1.3 (*)    All other components within normal limits  CBC - Abnormal; Notable for the following components:   WBC 10.8 (*)    MCV 102.2 (*)    All other components within normal limits  DIFFERENTIAL - Abnormal; Notable for the following components:   Neutro Abs 8.1 (*)    All other components within normal limits  COMPREHENSIVE METABOLIC PANEL - Abnormal; Notable for the following components:   Glucose, Bld 146 (*)    Creatinine, Ser 1.25 (*)    Albumin 3.0 (*)    All other components within normal limits  URINALYSIS, ROUTINE W REFLEX MICROSCOPIC - Abnormal; Notable for the following components:   APPearance HAZY (*)    Leukocytes,Ua TRACE (*)    All other components within normal limits  RAPID URINE DRUG SCREEN, HOSP PERFORMED - Abnormal; Notable for the following components:   Tetrahydrocannabinol POSITIVE (*)    All other components within normal limits  I-STAT CHEM 8, ED - Abnormal; Notable for the following components:   Glucose, Bld 149 (*)    Calcium, Ion 1.14 (*)    Hemoglobin 17.3 (*)    All other components within normal limits  CBG MONITORING, ED - Abnormal; Notable for the following components:   Glucose-Capillary 149 (*)    All other components within normal limits  I-STAT ARTERIAL BLOOD GAS, ED - Abnormal; Notable for the following components:   pH, Arterial 7.257 (*)    pCO2 arterial 65.5 (*)    Bicarbonate 29.3 (*)    All other components within normal limits  TROPONIN I (HIGH SENSITIVITY) - Abnormal; Notable for the following components:   Troponin I (High Sensitivity) 27 (*)    All other components within normal limits  TROPONIN I (HIGH SENSITIVITY) - Abnormal; Notable for the following components:   Troponin I (High Sensitivity) 27 (*)    All other  components within normal limits  RESP PANEL BY RT-PCR (FLU A&B, COVID) ARPGX2  APTT  ETHANOL  BLOOD GAS, ARTERIAL  BASIC METABOLIC PANEL  CBC  PROTIME-INR  SAMPLE TO BLOOD BANK    EKG None  Radiology CT HEAD WO CONTRAST  Result Date: 02/15/2021 CLINICAL DATA:  Parenchymal hemorrhage, follow-up EXAM: CT HEAD WITHOUT CONTRAST TECHNIQUE: Contiguous axial images were obtained from the base of  the skull through the vertex without intravenous contrast. COMPARISON:  Earlier today FINDINGS: Brain: Right lateral cerebellar hemorrhage measures 2.4 x 1.8 cm. Right temporal lobe hemorrhage measures 3.3 x 2.7 cm. 6 mm area of hemorrhage in the right temporal lobe adjacent to the temporal horn of the lateral ventricle. These are unchanged since prior study. No significant right to left midline shift. No hydrocephalus. No new areas of hemorrhage. Vascular: No hyperdense vessel or unexpected calcification. Skull: No acute calvarial abnormality. Sinuses/Orbits: No acute findings Other: None IMPRESSION: Stable areas of hemorrhage in the right temporal lobe and right cerebellum. No new areas of hemorrhage. Electronically Signed   By: Charlett Nose M.D.   On: 02/15/2021 21:33   CT CHEST W CONTRAST  Result Date: 02/15/2021 CLINICAL DATA:  Fall 3 days ago. EXAM: CT CHEST, ABDOMEN, AND PELVIS WITH CONTRAST TECHNIQUE: Multidetector CT imaging of the chest, abdomen and pelvis was performed following the standard protocol during bolus administration of intravenous contrast. CONTRAST:  OMNIPAQUE IOHEXOL 300 MG/ML  SOLN COMPARISON:  CT angio chest 02/12/2021 FINDINGS: CHEST: Ports and Devices: The endotracheal tube terminates 4 cm above the carina. The enteric tube courses below the hemidiaphragm with tip and side port within the gastric lumen. Lungs/airways: Interval increase in left lower lobe hypodense consolidation. Linear atelectasis and subsegmental atelectasis of the right upper lobe. There is a 4 mm nodule  within the right upper lobe. No pulmonary mass. No pulmonary contusion or laceration. No pneumatocele formation. The central airways are patent. Pleura: No pleural effusion. No pneumothorax. No hemothorax. Lymph Nodes: No mediastinal, hilar, or axillary lymphadenopathy. Mediastinum: No pneumomediastinum. No aortic injury or mediastinal hematoma. The thoracic aorta is normal in caliber. Improved but persistent increased right to left ventricular ratio. The heart is otherwise normal in size. No longer paradoxical bowing of the interventricular septum however still noted to be straightening. No significant pericardial effusion. Slightly improved but persistently extensive bilateral central pulmonary emboli that extend to all pulmonary lobes. The esophagus is unremarkable. The thyroid is unremarkable. Chest Wall / Breasts: Marked bilateral gynecomastia that appears to be asymmetric, left greater than right. Musculoskeletal: No acute displaced costochondral junction fracture. No acute displaced rib or sternal fracture. No spinal fracture. ABDOMEN / PELVIS: Liver: Not enlarged. No focal lesion. No laceration or subcapsular hematoma. Biliary System: The gallbladder is otherwise unremarkable with no radio-opaque gallstones. No biliary ductal dilatation. Pancreas: Normal pancreatic contour. No main pancreatic duct dilatation. Spleen: Not enlarged. No focal lesion. No laceration, subcapsular hematoma, or vascular injury. Adrenal Glands: No nodularity bilaterally. Kidneys: Bilateral kidneys enhance symmetrically. No hydronephrosis. No contusion, laceration, or subcapsular hematoma. No injury to the vascular structures or collecting systems. No hydroureter. The urinary bladder is decompressed with a urinary Foley catheter terminating within the urinary bladder lumen. Bowel: No small or large bowel wall thickening or dilatation. Mesentery, Omentum, and Peritoneum: No simple free fluid ascites. No pneumoperitoneum. No  hemoperitoneum. No mesenteric hematoma identified. No organized fluid collection. Pelvic Organs: Normal. Lymph Nodes: No abdominal, pelvic, inguinal lymphadenopathy. Vasculature: No abdominal aorta or iliac aneurysm. No active contrast extravasation or pseudoaneurysm. Musculoskeletal: Soft tissue edema of bilateral/flanks with small hematoma formation along the right flank at the level of the gluteal soft tissues (3:104). No acute pelvic fracture. No spinal fracture. IMPRESSION: 1. Slightly improved but persistently extensive bilateral central pulmonary emboli that extend to all pulmonary lobes. Persistent but improved secondary findings of right heart strain. 2. Interval increase in left lower lobe hypodense consolidation likely representing  infection/inflammation. 3. Marked bilateral gynecomastia that appears to be asymmetric, left greater than right. Given asymmetry and patient age/gender, recommend correlation with mammography to exclude an underlying lesion. 4. Indeterminate 4 mm right upper lobe nodule. 5. No acute traumatic injury to the chest, abdomen, or pelvis. 6. No acute fracture or traumatic malalignment of the thoracic or lumbar spine. 7. Lines and tubes in appropriate position. Electronically Signed   By: Tish FredericksonMorgane  Naveau M.D.   On: 02/15/2021 21:40   CT CERVICAL SPINE WO CONTRAST  Result Date: 02/15/2021 CLINICAL DATA:  Neck trauma EXAM: CT CERVICAL SPINE WITHOUT CONTRAST TECHNIQUE: Multidetector CT imaging of the cervical spine was performed without intravenous contrast. Multiplanar CT image reconstructions were also generated. COMPARISON:  None. FINDINGS: Alignment: Normal Skull base and vertebrae: No acute fracture. No primary bone lesion or focal pathologic process. Soft tissues and spinal canal: No prevertebral fluid or swelling. No visible canal hematoma. Disc levels: Degenerative disc disease from C5-6 through C6-7. Mild bilateral degenerative facet disease. Upper chest: Airspace disease in  the right upper lobe with right pleural effusion partially imaged. Other: Endotracheal tube and NG tube are noted in place. IMPRESSION: No acute bony abnormality in the cervical spine. Cervical spondylosis. Right pleural effusion and right upper lobe airspace disease partially imaged peer Electronically Signed   By: Charlett NoseKevin  Dover M.D.   On: 02/15/2021 21:28   CT ABDOMEN PELVIS W CONTRAST  Result Date: 02/15/2021 CLINICAL DATA:  Fall 3 days ago. EXAM: CT CHEST, ABDOMEN, AND PELVIS WITH CONTRAST TECHNIQUE: Multidetector CT imaging of the chest, abdomen and pelvis was performed following the standard protocol during bolus administration of intravenous contrast. CONTRAST:  100mL OMNIPAQUE IOHEXOL 300 MG/ML  SOLN COMPARISON:  CT angio chest 02/12/2021 FINDINGS: CHEST: Ports and Devices: The endotracheal tube terminates 4 cm above the carina. The enteric tube courses below the hemidiaphragm with tip and side port within the gastric lumen. Lungs/airways: Interval increase in left lower lobe hypodense consolidation. Linear atelectasis and subsegmental atelectasis of the right upper lobe. There is a 4 mm nodule within the right upper lobe. No pulmonary mass. No pulmonary contusion or laceration. No pneumatocele formation. The central airways are patent. Pleura: No pleural effusion. No pneumothorax. No hemothorax. Lymph Nodes: No mediastinal, hilar, or axillary lymphadenopathy. Mediastinum: No pneumomediastinum. No aortic injury or mediastinal hematoma. The thoracic aorta is normal in caliber. Improved but persistent increased right to left ventricular ratio. The heart is otherwise normal in size. No longer paradoxical bowing of the interventricular septum however still noted to be straightening. No significant pericardial effusion. Slightly improved but persistently extensive bilateral central pulmonary emboli that extend to all pulmonary lobes. The esophagus is unremarkable. The thyroid is unremarkable. Chest Wall /  Breasts: Marked bilateral gynecomastia that appears to be asymmetric, left greater than right. Musculoskeletal: No acute displaced costochondral junction fracture. No acute displaced rib or sternal fracture. No spinal fracture. ABDOMEN / PELVIS: Liver: Not enlarged. No focal lesion. No laceration or subcapsular hematoma. Biliary System: The gallbladder is otherwise unremarkable with no radio-opaque gallstones. No biliary ductal dilatation. Pancreas: Normal pancreatic contour. No main pancreatic duct dilatation. Spleen: Not enlarged. No focal lesion. No laceration, subcapsular hematoma, or vascular injury. Adrenal Glands: No nodularity bilaterally. Kidneys: Bilateral kidneys enhance symmetrically. No hydronephrosis. No contusion, laceration, or subcapsular hematoma. No injury to the vascular structures or collecting systems. No hydroureter. The urinary bladder is decompressed with a urinary Foley catheter terminating within the urinary bladder lumen. Bowel: No small or large bowel wall  thickening or dilatation. Mesentery, Omentum, and Peritoneum: No simple free fluid ascites. No pneumoperitoneum. No hemoperitoneum. No mesenteric hematoma identified. No organized fluid collection. Pelvic Organs: Normal. Lymph Nodes: No abdominal, pelvic, inguinal lymphadenopathy. Vasculature: No abdominal aorta or iliac aneurysm. No active contrast extravasation or pseudoaneurysm. Musculoskeletal: Soft tissue edema of bilateral/flanks with small hematoma formation along the right flank at the level of the gluteal soft tissues (3:104). No acute pelvic fracture. No spinal fracture. IMPRESSION: 1. Slightly improved but persistently extensive bilateral central pulmonary emboli that extend to all pulmonary lobes. Persistent but improved secondary findings of right heart strain. 2. Interval increase in left lower lobe hypodense consolidation likely representing infection/inflammation. 3. Marked bilateral gynecomastia that appears to be  asymmetric, left greater than right. Given asymmetry and patient age/gender, recommend correlation with mammography to exclude an underlying lesion. 4. Indeterminate 4 mm right upper lobe nodule. 5. No acute traumatic injury to the chest, abdomen, or pelvis. 6. No acute fracture or traumatic malalignment of the thoracic or lumbar spine. 7. Lines and tubes in appropriate position. Electronically Signed   By: Tish Frederickson M.D.   On: 02/15/2021 21:40   DG Chest Portable 1 View  Result Date: 02/15/2021 CLINICAL DATA:  ET tube placement EXAM: PORTABLE CHEST 1 VIEW COMPARISON:  None. FINDINGS: Endotracheal tube is 4 cm above the carina. NG tube enters the stomach. Cardiomegaly, vascular congestion. Bilateral upper lobe airspace opacities, new since prior CT. No visible effusions or pneumothorax. No acute bony abnormality. IMPRESSION: ET tube approximately 4.4 cm above the carina. Cardiomegaly, vascular congestion. New bilateral upper lobe airspace opacities. Electronically Signed   By: Charlett Nose M.D.   On: 02/15/2021 16:53   CT HEAD CODE STROKE WO CONTRAST  Result Date: 02/15/2021 CLINICAL DATA:  Code stroke. Acute presentation with left-sided weakness. By previous history, there was a fall with head trauma 3 days ago. EXAM: CT HEAD WITHOUT CONTRAST TECHNIQUE: Contiguous axial images were obtained from the base of the skull through the vertex without intravenous contrast. COMPARISON:  02/12/2021 FINDINGS: Brain: Right lateral cerebellar intraparenchymal hemorrhage measuring 2.8 x 1.8 x 1.5 cm (volume = 4 cm^3). Mild surrounding edema. No other posterior fossa abnormality. 6 mm intraparenchymal hemorrhage adjacent to the lateral margin of the right temporal tip. 3.2 x 2.8 x 2.1 cm (volume = 9.9 cm^3) hematoma in the right temporal lobe just above the temporal bone with surrounding edema. Minor mass effect. Right-to-left shift of 1 or 2 mm. No sign of acute ischemic infarction. No mass, hydrocephalus or  extra-axial hematoma. Vascular: No visible vascular abnormality. Skull: Normal Sinuses/Orbits: Clear/normal Other: None IMPRESSION: Right lateral cerebellar intraparenchymal hemorrhage measuring 4 cc volume with mild surrounding edema. Right lateral temporal intraparenchymal hematoma measuring 10 cc volume with mild surrounding edema. 6 mm right temporal intraparenchymal hematoma adjacent to the temporal tip of the lateral ventricle. Mild mass effect with 1-2 cm of right-to-left shift. No skull fracture or extra-axial bleed. These abnormalities were not visible in any way on the study 3 days ago. These results were communicated to Dr. Selina Cooley At 3:27 pmon 3/19/2022by text page via the [facility] messaging system. Electronically Signed   By: Paulina Fusi M.D.   On: 02/15/2021 15:28    Procedures Procedure Name: Intubation Date/Time: 02/15/2021 3:56 PM Performed by: Kugler, [place], MD Pre-anesthesia Checklist: Patient identified, Emergency Drugs available, Timeout performed, Suction available and Patient being monitored Oxygen Delivery Method: Ambu bag Preoxygenation: Pre-oxygenation with 100% oxygen Induction Type: Rapid sequence Ventilation: Mask ventilation without difficulty  Laryngoscope Size: Glidescope Grade View: Grade III Tube size: 7.5 mm Number of attempts: 1 Airway Equipment and Method: Video-laryngoscopy Placement Confirmation: ETT inserted through vocal cords under direct vision,  CO2 detector and Breath sounds checked- equal and bilateral Tube secured with: ETT holder      Medications Ordered in ED Medications  sodium chloride flush (NS) 0.9 % injection 3 mL (has no administration in time range)  LORazepam (ATIVAN) 2 MG/ML injection (has no administration in time range)  LORazepam (ATIVAN) 2 MG/ML injection (has no administration in time range)  docusate (COLACE) 50 MG/5ML liquid 100 mg (has no administration in time range)  polyethylene glycol (MIRALAX / GLYCOLAX) packet 17 g (has  no administration in time range)  fentaNYL in NS (15mcg/ml) infusion-PREMIX (200 mcg/hr Intravenous Infusion Verify 02/15/21 2200)  fentaNYL (SUBLIMAZE) bolus via infusion 50 mcg (50 mcg Intravenous Bolus from Bag 02/15/21 1841)  midazolam (VERSED) 50 mg/50 mL (1 mg/mL) premix infusion (10 mg/hr Intravenous New Bag/Given 02/15/21 2241)  midazolam (VERSED) bolus via infusion 1-2 mg (has no administration in time range)  docusate sodium (COLACE) capsule 100 mg (has no administration in time range)  polyethylene glycol (MIRALAX / GLYCOLAX) packet 17 g (has no administration in time range)  pantoprazole (PROTONIX) injection 40 mg (has no administration in time range)  levETIRAcetam (KEPPRA) IVPB 1500 mg/ 100 mL premix (has no administration in time range)  LORazepam (ATIVAN) injection 2 mg (2 mg Intravenous Given 02/15/21 1505)  LORazepam (ATIVAN) injection 2 mg (2 mg Intravenous Given 02/15/21 1510)  prothrombin complex conc human (KCENTRA) IVPB 4,704 Units (0 Units Intravenous Stopped 02/15/21 1715)  levETIRAcetam (KEPPRA) IVPB 1500 mg/ 100 mL premix (0 mg Intravenous Stopped 02/15/21 1600)    Followed by  levETIRAcetam (KEPPRA) IVPB 1500 mg/ 100 mL premix (0 mg Intravenous Stopped 02/15/21 1608)    Followed by  levETIRAcetam (KEPPRA) IVPB 1000 mg/100 mL premix (0 mg Intravenous Stopped 02/15/21 1610)  fentaNYL (SUBLIMAZE) injection 50 mcg (50 mcg Intravenous Given 02/15/21 1646)  etomidate (AMIDATE) injection (20 mg Intravenous Given 02/15/21 1525)  succinylcholine (ANECTINE) injection (100 mg Intravenous Given 02/15/21 1525)  iohexol (OMNIPAQUE) 300 MG/ML solution 100 mL (100 mLs Intravenous Contrast Given 02/15/21 2101)    ED Course  I have reviewed the triage vital signs and the nursing notes.  Pertinent labs & imaging results that were available during my care of the patient were reviewed by me and considered in my medical decision making (see chart for details).    MDM  Rules/Calculators/A&P                         50 year old male with a recent history of PE on Eliquis who presents as a code stroke found down and minimally responsive in a parking lot.  Initial vitals with blood pressure of 101/44.  Heart rate of 119.  Respiratory rate of 22.  Patient taken immediately to the CT scanner for code stroke imaging.  Seizure activity while in the CT scanner. Patient given 4 mg of Ativan there.  CT head notable for intracranial bleed.  GCS less than 8, decision was made to intubate for airway protection.  Procedure performed as documented above. No complications with RSI or tube placement.  Patient's Eliquis reversed with PCC.  Keppra load administered for status seizure activity.  Boluses of fluid given for mild hypotension. Versed ordered for sedation given seizure activity.  Neurosurgery consulted.  No acute surgical interventions necessary  at this time.  Given unclear history of patient's presentation, additional trauma scans ordered including CT C-spine, CT chest, and CT abdomen pelvis to evaluate for traumatic injuries.  Troponin ordered to evaluate for cardiovascular etiology or possible massive or submassive PE as a cause of patient's presentation.  Critical care medicine consulted for admission. Patient to be admitted to ICU for further workup and management of presentation.  Final Clinical Impression(s) / ED Diagnoses Final diagnoses:  Cerebral hemorrhage (HCC)  Respiratory failure [facility])   Rx / DC Orders ED Discharge Orders    None       Kugler, [place], MD 02/15/21 2256    Lorre Nick, MD 02/16/21 [phone]

## 2021-02-15 NOTE — ED Notes (Signed)
Pt to ED via EMS from [facility] parking lot, pt was found down in the parking lot on the opposite side of his car, Unknown last known well. Left side weakness noted by EMS and code stroke called. Last VS: 160/120 hr 130, CBG 132. #18 R HAND.

## 2021-02-15 NOTE — Progress Notes (Signed)
Attempted to call RN, currently busy, [facility] asked for a callback number to get in touch with EEG once RN is unoccupied.

## 2021-02-15 NOTE — Progress Notes (Signed)
Overnight EEG in process, Result pending

## 2021-02-15 NOTE — Progress Notes (Signed)
EEG done, result pending

## 2021-02-15 NOTE — Progress Notes (Signed)
Pt's brother Danny Lowe and his wife arrived at ED and Danny Boyer, NP and I were able to provide update about status.   They also provided subsequent information that pt's original presentation to [facility] 3/16 was 2/2 syncopal episode and sz like activity. Additionally that they were the ones who picked up the patient at discharge today around 1 and dropped him at his house at 1330 or so and he was complaining of dizziness at that time but when inside and they left. Pt then drove to [facility] and per their report the pharmacists reported to the pt's son that he had a syncopal episode inside of the store, awoken, exited the store attempted to go to the incorrect car and then had another syncopal event with sz like activity and ems was called.   This was information that was not originally revealed at the time of H&P.   All questions were answered to the best of my ability. Pt has a legal/separated wife who he does not want contacted, he additionally has two adult children whom the brother will attempt to reach to provide infomration.   Danny Lowe [phone]

## 2021-02-15 NOTE — Progress Notes (Signed)
Chaplain responding to code blue for pt Danny Lowe. Pt unavailable and no family present at time of visit. Chaplain remains available for follow-up spiritual/emotional support as needed.  Ardath Sax, MDiv     02/15/21 1800  Clinical Encounter Type  Visited With Patient not available  Visit Type Initial;Code

## 2021-02-15 NOTE — ED Notes (Signed)
Pt brother to bedside

## 2021-02-15 NOTE — H&P (Signed)
NAME:  Danny Lowe, MRN:  [id number], DOB:  Aug 19, 1971, LOS: 0 ADMISSION DATE:  02/15/2021, CONSULTATION DATE:  02/15/2021 REFERRING MD:  [place] Kuglar, PA, CHIEF COMPLAINT:  ICH  History of Present Illness:  HPI obtained from medical chart review as patient is currently intubated and sedated on mechanical ventilation.   50 year old male with prior history of daily ETOH use, PTSD, depression and recent admit with PE/ DVT found down in [facility] parking lot after discharge.  He was apparently there to fill his Eliquis prescription.  He was admitted to [facility] service on 3/16 after he presented after syncopal event with initial negative CTH but noted to have right frontal/ occipital hematoma.  He was found to have unprovoked massive PE with right heart strain and extensive left DVT s/p half dose tPA.  Presented after syncopal event with initial negative CTH but noted to have right frontal/ occipital hematoma.  He was transitioned to Eliquis 3/18 and was hemodynamically stable and tolerated physical therapy without hypoxia or complications discharged home.    He arrived as a code stroke in ER by EMS confused with reported seizure activity and worsening mental status after being found down in parking lot, unknown events prior to.  He developed tonic-clonic seizure in CT.   CTH showed right lateral cerebellar and temporal IPH with right to left shift 1-2 mm.  He was intubated for airway protection.  Ativan given, loaded with Keppra, and Kcentra started.  Additionally hypotensive now improved after 2L NS bolus.  PCCM called for admission.   Pertinent  Medical History  Daily ETOH use, PTSD, depression, PE/ DVT (02/12/2021)  Significant Hospital Events: Including procedures, antibiotic start and stop dates in addition to other pertinent events   . 3/19 discharged for PE/ DVT found down 2hrs later in parking lot; code stroke-> found with ICH, witnessed seizure, and intubated   Interim History /  Subjective:    Objective   Blood pressure 120/75, pulse (!) 103, resp. rate (!) 22, SpO2 100 %.    Vent Mode: PRVC FiO2 (%):  [100 %] 100 % Set Rate:  [18 bmp] 18 bmp Vt Set:  [560 mL] 560 mL PEEP:  [5 cmH20] 5 cmH20 Plateau Pressure:  [15 cmH20] 15 cmH20  No intake or output data in the 24 hours ending 02/15/21 1638 There were no vitals filed for this visit.  Examination: General:  Unresponsive, s/p intubation coughing appears acutely ill HEENT: R hematoma and injected sclera, pupils minimally reactive, MM pink/moist, lip laceration noted Neuro: unresponsive, withdraws to pain, coughing, non purposeful CV: s1s2 , no m/r/g PULM:  B/l bs rhonchi GI: soft, bsx4 active  Extremities: warm/dry, no edema  Skin: no rashes or lesions   Labs/imaging personally reviewed   3/19 [facility] >> Right lateral cerebellar intraparenchymal hemorrhage measuring 4 cc volume with mild surrounding edema.  Right lateral temporal intraparenchymal hematoma measuring 10 cc volume with mild surrounding edema.  6 mm right temporal intraparenchymal hematoma adjacent to the temporal tip of the lateral ventricle. Mild mass effect with 1-2 cm of right-to-left shift.  No skull fracture or extra-axial bleed. These abnormalities were not visible in any way on the study 3 days ago.  Resolved Hospital Problem list    Assessment & Plan:  Acute hypoxic/hypercarbic resp failure 2/2 ICH and inability to protect airway:  -emergently intubated in ed -cxr pending -abg pending -sats 100% at this time.  -attempted to update family but no answer, left message Acute PE:  -just  discharged with eliquis for this (first dose 3/18) -s/p 1/2 dose TPA on 3/17 -reversed eliquis with k-centra -monitor oxygenation Extensive LE DVT:  -will need IR consult for IVCF -u/s done 3/17 after tpa dosing.   Seizure-like activity:  -eeg -neuro consult -2/2 ich ICH:  -history of fall with R sided hematoma 3/16 -neurosx consult  pending -eliquis reversal completed with K centra -monitor for further bleeding.  -ensure BP is <160 per neuro  etoh use disorder:  -check uds   Best practice (evaluated daily)  Diet:  NPO Pain/Anxiety/Delirium protocol (if indicated): Yes (RASS goal -1) VAP protocol (if indicated): Yes DVT prophylaxis: Contraindicated GI prophylaxis: PPI Glucose control:  na Central venous access:  N/A   Labs   CBC: Recent Labs  Lab 02/13/21 0624 02/13/21 1514 02/14/21 0531 02/15/21 0143 02/15/21 1509 02/15/21 1510  WBC 9.2 8.7 8.5 8.2  --  10.8*  NEUTROABS  --   --   --   --   --  8.1*  HGB 15.4 15.9 15.4 15.1 17.3* 16.3  HCT 45.9 46.4 45.3 43.9 51.0 50.2  MCV 98.1 98.7 99.1 98.2  --  102.2*  PLT 149* 133* 146* 150  --  182    Basic Metabolic Panel: Recent Labs  Lab 02/12/21 2200 02/12/21 2230 02/12/21 2230 02/13/21 0624 02/14/21 0531 02/15/21 0143 02/15/21 1509 02/15/21 1510  NA  --  138   < > 136 136 135 137 137  K  --  3.3*   < > 4.1 5.0 3.9 3.8 3.9  CL  --  101   < > 102 103 103 102 102  CO2  --  20*  --  24 26 25   --  25  GLUCOSE  --  86   < > 76 92 93 149* 146*  BUN  --  11   < > 8 6 6 6 6   CREATININE  --  1.32*   < > 1.32* 1.18 1.15 1.10 1.25*  CALCIUM  --  8.3*  --  8.4* 8.2* 8.2*  --  8.9  MG 1.5*  --   --   --   --   --   --   --    < > = values in this interval not displayed.   GFR: Estimated Creatinine Clearance: 80.4 mL/min (A) (by C-G formula based on SCr of 1.25 mg/dL (H)). Recent Labs  Lab 02/12/21 2223 02/13/21 0002 02/13/21 0624 02/13/21 1514 02/14/21 0531 02/15/21 0143 02/15/21 1510  WBC  --   --    < > 8.7 8.5 8.2 10.8*  LATICACIDVEN 5.6* 3.9*  --   --   --   --   --    < > = values in this interval not displayed.    Liver Function Tests: Recent Labs  Lab 02/12/21 2200 02/13/21 0624 02/14/21 0531 02/15/21 0143 02/15/21 1510  AST 80* 48* 67* 37 40  ALT 25 27 36 32 34  ALKPHOS 26* 35* 33* 39 40  BILITOT 1.1 2.0* 1.2 0.8 0.6   PROT 5.0* 6.2* 5.7* 5.6* 7.2  ALBUMIN 2.2* 2.6* 2.4* 2.3* 3.0*   Recent Labs  Lab 02/12/21 2200  LIPASE 26   Recent Labs  Lab 02/13/21 0008  AMMONIA 40*    ABG    Component Value Date/Time   HCO3 23.6 02/12/2021 2223   TCO2 25 02/15/2021 1509   ACIDBASEDEF 2.6 (H) 02/12/2021 2223   O2SAT 47.3 02/12/2021 2223     Coagulation Profile:  Recent Labs  Lab 02/12/21 2358 02/13/21 0624 02/15/21 1510  INR 1.2 1.8* 1.3*    Cardiac Enzymes: Recent Labs  Lab 02/13/21 0007  CKTOTAL 241    HbA1C: No results found for: HGBA1C  CBG: Recent Labs  Lab 02/12/21 2150 02/15/21 1505  GLUCAP 76 149*    Review of Systems:   Unable to obtain 2/2 intubated status  Past Medical History:  He,  has a past medical history of Depression.   Surgical History:  No past surgical history on file.   Social History:   reports that he has never smoked. His smokeless tobacco use includes chew. He reports current alcohol use. He reports that he does not use drugs.   Family History:  His family history is not on file.   Allergies No Known Allergies   Home Medications  Prior to Admission medications   Medication Sig Start Date End Date Taking? Authorizing Provider  apixaban (ELIQUIS) 5 MG TABS tablet Take 2 tablets (10 mg total) by mouth 2 (two) times daily. 02/15/21   Mannam, Colbert Coyer, MD  apixaban (ELIQUIS) 5 MG TABS tablet Take 1 tablet (5 mg total) by mouth 2 (two) times daily. 02/21/21 05/22/21  Chilton Greathouse, MD  traZODone (DESYREL) 100 MG tablet Take 100 mg by mouth at bedtime as needed for sleep.    [provider]      Critical care time: The patient is critically ill with multiple organ systems failure and requires high complexity decision making for assessment and support, frequent evaluation and titration of therapies, application of advanced monitoring technologies and extensive interpretation of multiple databases.  Critical care time 49 mins. This represents  my time independent of the NPs time taking care of the pt. This is excluding procedures.    Briant Sites DO [facility] 02/15/2021, 5:44 PM See Amion for pager If no response to pager, please call [phone] until 1900 After 1900 please call [facility] [phone]

## 2021-02-15 NOTE — ED Notes (Signed)
EEG called to complete ordered EEG

## 2021-02-16 ENCOUNTER — Inpatient Hospital Stay (HOSPITAL_COMMUNITY): Payer: 59

## 2021-02-16 ENCOUNTER — Encounter (HOSPITAL_COMMUNITY): Payer: Self-pay | Admitting: Critical Care Medicine

## 2021-02-16 DIAGNOSIS — I824Y9 Acute embolism and thrombosis of unspecified deep veins of unspecified proximal lower extremity: Secondary | ICD-10-CM | POA: Diagnosis not present

## 2021-02-16 DIAGNOSIS — I2699 Other pulmonary embolism without acute cor pulmonale: Secondary | ICD-10-CM | POA: Diagnosis not present

## 2021-02-16 DIAGNOSIS — R569 Unspecified convulsions: Secondary | ICD-10-CM

## 2021-02-16 DIAGNOSIS — J9601 Acute respiratory failure with hypoxia: Secondary | ICD-10-CM | POA: Diagnosis not present

## 2021-02-16 DIAGNOSIS — I616 Nontraumatic intracerebral hemorrhage, multiple localized: Secondary | ICD-10-CM | POA: Diagnosis not present

## 2021-02-16 HISTORY — PX: IR IVC FILTER PLMT / S&I /IMG GUID/MOD SED: IMG701

## 2021-02-16 LAB — HEMOGLOBIN A1C
Hgb A1c MFr Bld: 4.8 % (ref 4.8–5.6)
Mean Plasma Glucose: 91.06 mg/dL

## 2021-02-16 LAB — GLUCOSE, CAPILLARY
Glucose-Capillary: 52 mg/dL — ABNORMAL LOW (ref 70–99)
Glucose-Capillary: 171 mg/dL — ABNORMAL HIGH (ref 70–99)
Glucose-Capillary: 74 mg/dL (ref 70–99)
Glucose-Capillary: 62 mg/dL — ABNORMAL LOW (ref 70–99)
Glucose-Capillary: 90 mg/dL (ref 70–99)
Glucose-Capillary: 50 mg/dL — ABNORMAL LOW (ref 70–99)
Glucose-Capillary: 71 mg/dL (ref 70–99)
Glucose-Capillary: 72 mg/dL (ref 70–99)

## 2021-02-16 LAB — CBC
HCT: 40.1 % (ref 39.0–52.0)
Hemoglobin: 13.8 g/dL (ref 13.0–17.0)
MCH: 33.9 pg (ref 26.0–34.0)
MCHC: 34.4 g/dL (ref 30.0–36.0)
MCV: 98.5 fL (ref 80.0–100.0)
Platelets: 148 10*3/uL — ABNORMAL LOW (ref 150–400)
RBC: 4.07 MIL/uL — ABNORMAL LOW (ref 4.22–5.81)
RDW: 14.6 % (ref 11.5–15.5)
WBC: 7.6 10*3/uL (ref 4.0–10.5)
nRBC: 0 % (ref 0.0–0.2)

## 2021-02-16 LAB — MAGNESIUM
Magnesium: 1.5 mg/dL — ABNORMAL LOW (ref 1.7–2.4)
Magnesium: 1.5 mg/dL — ABNORMAL LOW (ref 1.7–2.4)

## 2021-02-16 LAB — BASIC METABOLIC PANEL
Anion gap: 10 (ref 5–15)
BUN: 6 mg/dL (ref 6–20)
CO2: 25 mmol/L (ref 22–32)
Calcium: 8.2 mg/dL — ABNORMAL LOW (ref 8.9–10.3)
Chloride: 102 mmol/L (ref 98–111)
Creatinine, Ser: 0.98 mg/dL (ref 0.61–1.24)
GFR, Estimated: >60 mL/min (ref >60)
Glucose, Bld: 77 mg/dL (ref 70–99)
Potassium: 3.7 mmol/L (ref 3.5–5.1)
Sodium: 137 mmol/L (ref 135–145)

## 2021-02-16 LAB — LIPID PANEL
Cholesterol: 134 mg/dL (ref 0–200)
HDL: 33 mg/dL — ABNORMAL LOW (ref >40)
LDL Cholesterol: 86 mg/dL (ref 0–99)
Total CHOL/HDL Ratio: 4.1 RATIO
Triglycerides: 75 mg/dL (ref <150)
VLDL: 15 mg/dL (ref 0–40)

## 2021-02-16 LAB — PROTIME-INR
INR: 1.1 (ref 0.8–1.2)
Prothrombin Time: 13.7 seconds (ref 11.4–15.2)

## 2021-02-16 LAB — PHOSPHORUS: Phosphorus: 2.8 mg/dL (ref 2.5–4.6)

## 2021-02-16 MED ORDER — GADOBUTROL 1 MMOL/ML IV SOLN
9.5000 mL | Freq: Once | INTRAVENOUS | Status: AC | PRN
  Administered 2021-02-16: 9.5 mL via INTRAVENOUS

## 2021-02-16 MED ORDER — MAGNESIUM SULFATE 2 GM/50ML IV SOLN
2.0000 g | Freq: Once | INTRAVENOUS | Status: AC
  Administered 2021-02-16: 2 g via INTRAVENOUS
  Filled 2021-02-16: qty 50

## 2021-02-16 MED ORDER — ORAL CARE MOUTH RINSE
15.0000 mL | OROMUCOSAL | Status: DC
  Administered 2021-02-16 – 2021-02-17 (×10): 15 mL via OROMUCOSAL

## 2021-02-16 MED ORDER — LIDOCAINE HCL (PF) 1 % IJ SOLN
INTRAMUSCULAR | Status: AC | PRN
  Administered 2021-02-16: 10 mL

## 2021-02-16 MED ORDER — CHLORHEXIDINE GLUCONATE CLOTH 2 % EX PADS
6.0000 | MEDICATED_PAD | Freq: Every day | CUTANEOUS | Status: DC
  Administered 2021-02-18 – 2021-02-20 (×3): 6 via TOPICAL

## 2021-02-16 MED ORDER — CHLORHEXIDINE GLUCONATE 0.12% ORAL RINSE (MEDLINE KIT)
15.0000 mL | Freq: Two times a day (BID) | OROMUCOSAL | Status: DC
  Administered 2021-02-16 – 2021-02-17 (×4): 15 mL via OROMUCOSAL

## 2021-02-16 MED ORDER — DEXTROSE 50 % IV SOLN
INTRAVENOUS | Status: AC
  Filled 2021-02-16: qty 50

## 2021-02-16 MED ORDER — PROSOURCE TF PO LIQD
45.0000 mL | Freq: Two times a day (BID) | ORAL | Status: DC
  Administered 2021-02-16: 45 mL
  Filled 2021-02-16: qty 45

## 2021-02-16 MED ORDER — VITAL HIGH PROTEIN PO LIQD
1000.0000 mL | ORAL | Status: DC
  Administered 2021-02-16: 1000 mL

## 2021-02-16 MED ORDER — DEXTROSE 50 % IV SOLN
INTRAVENOUS | Status: AC
  Administered 2021-02-16: 50 mL
  Filled 2021-02-16: qty 50

## 2021-02-16 MED ORDER — IOHEXOL 300 MG/ML  SOLN
100.0000 mL | Freq: Once | INTRAMUSCULAR | Status: AC | PRN
  Administered 2021-02-16: 50 mL via INTRAVENOUS

## 2021-02-16 MED ORDER — LIDOCAINE HCL 1 % IJ SOLN
INTRAMUSCULAR | Status: AC
  Filled 2021-02-16: qty 20

## 2021-02-16 NOTE — Progress Notes (Addendum)
STROKE TEAM PROGRESS NOTE   INTERVAL HISTORY  Danny Lowe was recently admitted on 3/16 following a syncopal event.  CT head at the time showed a small right parieto-occipital scalp contusion w/o underlying fracture. He was found to have unprovoked massive PE with right heart strain and extensive left DVT and received tPA. He was transitioned to Eliquis 3/18 and was hemodynamically stable and tolerated physical therapy without hypoxia or complications and was discharged home.  The patient drove to [facility] and had a syncopal episode inside of the store, awoken, exited the store, attempted to go to the incorrect car and then had another syncopal event with sz like activity.  EMS was called and he was taken to the ED.  Per report the patient had seizure like activity en route with worsening mental status.  On arrival, Eliquis was reversed with Kcentra.  Pt developed clinical seizure with unresponsiveness (staring ahead, nonverbal) and rhythmic shaking of his L arm and leg with intermittent spread to R leg. 2mg  IV ativan administered x2 without suppression. Pt desatted and became hypotensive. He was intubated and started on versed gtt for sedation and seizure control. He was loaded with keppra 40mg /kg and continued on 1500mg  bid.         His sitter was at the bedside.  BP low normal. Afebrile.   Examined on Versed and Fentanyl: Opened eyes to noxious stimuli, PERL (sluggish), EOMI, opened his mouth to command, FC x4, moves all extremities.    Vitals:   02/16/21 1215 02/16/21 1220 02/16/21 1225 02/16/21 1230  BP: 107/78 103/79 102/80 99/78  Pulse:      Resp:      Temp:      TempSrc:      SpO2:      Weight:       CBC:  Recent Labs  Lab 02/15/21 1510 02/15/21 1638 02/16/21 0138  WBC 10.8*  --  7.6  NEUTROABS 8.1*  --   --   HGB 16.3 15.6 13.8  HCT 50.2 46.0 40.1  MCV 102.2*  --  98.5  PLT 182  --  148*   Basic Metabolic Panel:  Recent Labs  Lab 02/12/21 2200 02/12/21 2230  02/15/21 1510 02/15/21 1638 02/16/21 0138  NA  --    < > 137 139 137  K  --    < > 3.9 4.2 3.7  CL  --    < > 102  --  102  CO2  --    < > 25  --  25  GLUCOSE  --    < > 146*  --  77  BUN  --    < > 6  --  6  CREATININE  --    < > 1.25*  --  0.98  CALCIUM  --    < > 8.9  --  8.2*  MG 1.5*  --   --   --   --    < > = values in this interval not displayed.   Lipid Panel: Pending  HgbA1c: Pending Urine Drug Screen:  Recent Labs  Lab 02/15/21 1719  LABOPIA NONE DETECTED  COCAINSCRNUR NONE DETECTED  LABBENZ NONE DETECTED  AMPHETMU NONE DETECTED  THCU POSITIVE*  LABBARB NONE DETECTED    Alcohol Level  Recent Labs  Lab 02/15/21 1701  ETH <10    IMAGING past 24 hours CT HEAD WO CONTRAST Result Date: 02/15/2021 IMPRESSION: Stable areas of hemorrhage in the right temporal lobe and right cerebellum.  No new areas of hemorrhage. Electronically   CT CHEST W CONTRAST Result Date: 02/15/2021 IMPRESSION:  1. Slightly improved but persistently extensive bilateral central pulmonary emboli that extend to all pulmonary lobes. Persistent but improved secondary findings of right heart strain.  2. Interval increase in left lower lobe hypodense consolidation likely representing infection/inflammation.  3. Marked bilateral gynecomastia that appears to be asymmetric, left greater than right. Given asymmetry and patient age/gender, recommend correlation with mammography to exclude an underlying lesion.  4. Indeterminate 4 mm right upper lobe nodule.  5. No acute traumatic injury to the chest, abdomen, or pelvis.  6. No acute fracture or traumatic malalignment of the thoracic or lumbar spine.  7. Lines and tubes in appropriate position.   CT CERVICAL SPINE WO CONTRAST Result Date: 02/15/2021 IMPRESSION: No acute bony abnormality in the cervical spine. Cervical spondylosis. Right pleural effusion and right upper lobe airspace disease partially imaged peer  CT ABDOMEN PELVIS W CONTRAST Result  Date: 02/15/2021  IMPRESSION:  1. Slightly improved but persistently extensive bilateral central pulmonary emboli that extend to all pulmonary lobes. Persistent but improved secondary findings of right heart strain.  2. Interval increase in left lower lobe hypodense consolidation likely representing infection/inflammation.  3. Marked bilateral gynecomastia that appears to be asymmetric, left greater than right. Given asymmetry and patient age/gender, recommend correlation with mammography to exclude an underlying lesion.  4. Indeterminate 4 mm right upper lobe nodule.  5. No acute traumatic injury to the chest, abdomen, or pelvis.  6. No acute fracture or traumatic malalignment of the thoracic or lumbar spine.  7. Lines and tubes in appropriate position.   DG Chest Port 1 View Result Date: 02/16/2021 IMPRESSION: Patchy opacity of left lung base, pneumonia is not excluded. Previously noted bilateral upper lobe airspace opacities have resolved, minimal residual atelectasis is identified in the right upper lobe. Electronically Signed   By: Sherian Rein M.D.   On: 02/16/2021 09:23   DG Chest Portable 1 View  Result Date: 02/15/2021 IMPRESSION: ET tube approximately 4.4 cm above the carina. Cardiomegaly, vascular congestion. New bilateral upper lobe airspace opacities.   Overnight EEG with video  Result Date: 02/16/2021 Impression: This was an abnormal continuous video EEG due to a slow, sedated background with focal slowing and LPDs in the right temporal region, 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 No seizures were seen.   CT HEAD CODE STROKE WO CONTRAST Result Date: 02/15/2021  IMPRESSION: Right lateral cerebellar intraparenchymal hemorrhage measuring 4 cc volume with mild surrounding edema. Right lateral temporal intraparenchymal hematoma measuring 10 cc volume with mild surrounding edema. 6 mm right temporal intraparenchymal hematoma adjacent to  the temporal tip of the lateral ventricle. Mild mass effect with 1-2 cm of right-to-left shift. No skull fracture or extra-axial bleed. These abnormalities were not visible in any way on the study 3 days ago.    PHYSICAL EXAM General - Well nourished, well developed, in no apparent distress, intubated. Cardiovascular - Regular rate and rhythm.  Mental Status -  Opens eyes to noxious stimuli, Unable to assess orientation, intubated, sedated.  Language including expression, naming, repetition, comprehension were not assessed due to intubated and sedated. Attention span and concentration were impaired due to sedation.  Cranial Nerves II - XII - II - unable to assess Visual fields.  Blinks to threat III, IV, VI - Extraocular movements intact. V - Facial sensation intact bilaterally. VII - Facial movement unable to assess. VIII -  Hearing & vestibular unable to assess fully.  Able to follow verbal commands X - unable to assess due to intubated and sedated XII - Tongue protrusion intact.  Motor Strength - The patient is able to move all 4 extremities.  Antigravity with bilateral lower extremities.  Able to follow commands with all 4s (thumbs up, 2 fingers and wiggles toes) Unable to assess pronator drift due to sedation.  Bulk was normal.    Sensory - unable to assess due to sedation   Coordination - unable to assess due to sedation  Gait and Station - deferred.    ASSESSMENT/PLAN Danny Lowe is a 50 y.o. male with history of PE/DVT on Eliquis, ETOH, PTSD, presenting following a syncopal event.  CT head revealed R temporal region (10cc) and R cerebellum (4cc).      ICH - R lateral cerebellar and temporal lobe ICH in the setting of Eliquis use, reversed by Kcentra  CT head: right lateral cerebellar IPH, right lateral temporal IPH x 2  Repeat CT head: Stable ICH   MRI stable ICH x 3  MRA unremarkable  2D Echo (02/13/21) 60-65%, severe dilatation and hypocontractility of the  mid-basal RV, consistent with RV strain from PE  LDL 86  HgbA1c 4.8  VTE prophylaxis - SCDx Diet: NPO  Eliquis (apixaban) daily prior to admission, now on No antithrombotic due toICH.  Therapy recommendations:  pending  Disposition:  pending  Seizures  Clinical seizures (3/19): staring ahead, nonverbal and rhythmic shaking of his L arm and leg   Loaded with Keppra then Keppra 1500mg  bid  Versed infusion   LTM EEG - slow, sedated background with focal slowing and LPDs in the right temporal region, 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 No seizures were seen.  Pulmonary Embolism  CT chest (3/19): Slightly improved but persistently extensive bilateral central pulmonary emboli that extend to all pulmonary lobes.   Eliquis prior to admission  Not a candidate for full anticoagulation at this time due to intraparenchymal hemorrhages  DVTs  Eliquis prior to admission  IVC Filter placement today  Not a candidate for full anticoagulation at this time due to intraparenchymal hemorrhages  Acute Respiratory failure  Intubated in the ED after multiple seizure episodes  Sedated on versed and fentanyl  CCM onboard  Hypotension  Sedation may be contributing  MAP goal >65, SBP goal <140 . Normal saline 2L bolus given on admission . Normal saline 4050ml/hr  Hyperlipidemia  Home meds:  none  LDL 86, goal < 70  Consider starting statin at discharge   Other Stroke Risk Factors  ETOH use, alcohol level <10, will advise to drink no more than 1- 2 drink(s) a day  Substance abuse - UDS:  THC POSITIVE. Patient will be advised to stop using due to stroke risk.  Obesity, Body mass index is 31.91 kg/m., BMI >/= 30 associated with increased stroke risk, recommend weight loss, diet and exercise as appropriate   Coronary artery disease  Congestive heart failure  Other Active Problems  Incidental finding of a 4mm right upper  lobe lung nodule on CT chest (02/15/21): follow-up with PCP  Anxiety/depression: takes Zoloft 150mg  daily at home, Trazodone for sleep  Hospital day # 1  Lissy Olivencia-Simmons, ACNP-BC Stroke NP 02/16/2021  ATTENDING NOTE: I reviewed above note and agree with the assessment and plan. Pt was seen and examined.   50 year old male with history of alcohol, PTSD, depression [facility]  massive PE and DVT on Eliquis.  Readmitted same day after unresponsiveness at [facility] parking lot.  CT showed right temporal lobe and right cerebellum ICH.  Repeat CT and MRI showed stable ICH, Eliquis reversed with Kcentra.  Patient also had seizure activity in CT, loaded with Keppra followed by Keppra 1500 twice daily.  Patient was intubated due to unresponsiveness with seizure, on sedation, with hypotension.  On exam today, wife at bedside. Pt intubated  on sedation, eyes open to voice, able to follow simple commands b/l hand and feet. However, persistent coughing limited exam. With forced eye opening, eyes in mid position, blinking to visual threat bilaterally, doll's eyes present, able to track but incomplete gaze bilaterally, pupil equal size but very sluggish to light. Corneal reflex weakly present, gag and cough present. Breathing over the vent.  Facial symmetry not able to test due to ET tube.  Tongue protrusion not cooperative. Generalized weakness in all extremities but equal strength on observation and on request. No babinski. Sensation, coordination and gait not tested.  Repeat CT and MRI showed stable ICH, will relax BP goal to < 160. However, currently pt hypotension improved with IVF. CCM on board for vent management. Wean off vent if able. Hold off eliquis due to ICH, agree with IVC filter. Continue keppra. Close neuro check and vital signs.  For detailed assessment and plan, please refer to above as I have made changes wherever appropriate.   Marvel Plan, MD PhD Stroke  Neurology 02/16/2021 6:02 PM  This patient is critically ill due to ICH due to anticoagulation use, massive PE and DVT, seizure, respiratory failure and at significant risk of neurological worsening, death form edema, hematoma expansion, PEA with heart failure, status epilepticus, hypotension. This patient's care requires constant monitoring of vital signs, hemodynamics, respiratory and cardiac monitoring, review of multiple databases, neurological assessment, discussion with family, other specialists and medical decision making of high complexity. I spent 40 minutes of neurocritical care time in the care of this patient. I had long discussion with wife at bedside, updated pt current condition, treatment plan and potential prognosis, and answered all the questions.  She expressed understanding and appreciation.  I also discussed with Dr. Denese Killings    To contact Stroke Continuity provider, please refer to [email or web address]. After hours, contact General Neurology

## 2021-02-16 NOTE — Progress Notes (Signed)
This RN and Ashok Cordia, RN stayed with pt in IR while IVC filter was placed.  Pt on continuous versed gtt @ 7mg /hr and fentanyl gtt @ 13mcg/hr.  Bolus of 80m of Fentanyl and 2mg  of versed given @ 1212.  Pt resting at this time VS stable. Planning to go to MRI once we're finished.

## 2021-02-16 NOTE — Progress Notes (Signed)
LTM EEG discontinued - no skin breakdown at unhook.   

## 2021-02-16 NOTE — Procedures (Signed)
EEG Procedure CPT/Type of Study: 95720; 24hr EEG with video Referring Provider: Gaynell Face Primary Neurological Diagnosis: ICH  History: This is a 50 yr old patient, undergoing an EEG to evaluate for ICH, possible seizure. Clinical State: obtunded  Technical Description:  The EEG was performed using standard setting per the guidelines of American Clinical Neurophysiology Society (ACNS).  A minimum of 21 electrodes were placed on scalp according to the International 10-20 or/and 10-10 Systems. Supplemental electrodes were placed as needed. Single EKG electrode was also used to detect cardiac arrhythmia. Patient's behavior was continuously recorded on video simultaneously with EEG. A minimum of 16 channels were used for data display. Each epoch of study was reviewed manually daily and as needed using standard referential and bipolar montages. Computerized quantitative EEG analysis (such as compressed spectral array analysis, trending, automated spike & seizure detection) were used as indicated.   Day 1: from 1937 02/15/21 to 0740 02/16/21  EEG Description: Overall Amplitude:Normal Predominant Frequency: The background activity showed theta-delta activity without a posterior dominant rhythm, primarily a sedated background.  Superimposed Frequencies: sparse beta activity bilaterally The background was asymmetric with increased focal slowing and loss of detail over the right temporal region.  Background Abnormalities: Focal slowing; right temporal focal slowing and loss of detail Rhythmic or periodic pattern: Yes; lateralized periodic discharges: right temporal 1hz  sharp LPDs in runs but without ictal spread, occurring occasionally Epileptiform activity: Yes; sharp morphology to right temporal LPDs Electrographic seizures: no Events: no   Breach rhythm: no  Reactivity: Present  Stimulation procedures:  Hyperventilation: not done Photic stimulation: not done  Sleep Background: Stage  II  EKG:no significant arrhythmia  Impression: This was an abnormal continuous video EEG due to a slow, sedated background with focal slowing and LPDs in the right temporal region, 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 No seizures were seen.

## 2021-02-16 NOTE — Progress Notes (Signed)
eLink Physician-Brief Progress Note Patient Name: Sayge Salvato DOB: 1971-06-07 MRN: [id number]   Date of Service  02/16/2021  HPI/Events of Note  Hypomagnesemia - Mg++ = 1.5 and Creatinine = 0.98   eICU Interventions  Will replace Mg++.     Intervention Category Major Interventions: Electrolyte abnormality - evaluation and management  Lenell Antu 02/16/2021, 8:46 PM

## 2021-02-16 NOTE — Progress Notes (Signed)
RT NOTE: patient transported to MRI and back to room 4N26 without complications.

## 2021-02-16 NOTE — Progress Notes (Signed)
NAME:  Danny Lowe, MRN:  [id number], DOB:  Oct 04, 1971, LOS: 1 ADMISSION DATE:  02/15/2021, CONSULTATION DATE:  02/15/2021 REFERRING MD:  [place] Kuglar, PA, CHIEF COMPLAINT:  ICH  History of Present Illness:  HPI obtained from medical chart review as patient is currently intubated and sedated on mechanical ventilation.   50 year old male with prior history of daily ETOH use, PTSD, depression and recent admit with PE/ DVT found down in [facility] parking lot after discharge.  He was apparently there to fill his Eliquis prescription.  He was admitted to [facility] service on 3/16 after he presented after syncopal event with initial negative CTH but noted to have right frontal/ occipital hematoma.  He was found to have unprovoked massive PE with right heart strain and extensive left DVT s/p half dose tPA.  Presented after syncopal event with initial negative CTH but noted to have right frontal/ occipital hematoma.  He was transitioned to Eliquis 3/18 and was hemodynamically stable and tolerated physical therapy without hypoxia or complications discharged home.    He arrived as a code stroke in ER by EMS confused with reported seizure activity and worsening mental status after being found down in parking lot, unknown events prior to.  He developed tonic-clonic seizure in CT.   CTH showed right lateral cerebellar and temporal IPH with right to left shift 1-2 mm.  He was intubated for airway protection.  Ativan given, loaded with Keppra, and Kcentra started.  Additionally hypotensive now improved after 2L NS bolus.  PCCM called for admission.   Pertinent  Medical History  Daily ETOH use, PTSD, depression, PE/ DVT (02/12/2021)  Significant Hospital Events: Including procedures, antibiotic start and stop dates in addition to other pertinent events   . 3/19 discharged for PE/ DVT found down 2hrs later in parking lot; code stroke-> found with ICH, witnessed seizure, and intubated  . 3/19 CT head shows right  frontal parietal circumscribed hematoma in the right cerebellar circumscribed hematoma.  Minimal to no adjacent edema. . 3/20 EEG monitoring showed right temporal focus of irritability but no seizures.  Interim History / Subjective:   No significant overnight events.  Patient will wake up and reach for the tube when sedation lightened.  Objective   Blood pressure 116/78, pulse 87, temperature 97.7 F (36.5 C), temperature source Axillary, resp. rate 18, weight 98 kg, SpO2 100 %.    Vent Mode: PRVC FiO2 (%):  [50 %-100 %] 50 % Set Rate:  [18 bmp-22 bmp] 22 bmp Vt Set:  [560 mL] 560 mL PEEP:  [5 cmH20] 5 cmH20 Plateau Pressure:  [15 cmH20-20 cmH20] 19 cmH20   Intake/Output Summary (Last 24 hours) at 02/16/2021 1133 Last data filed at 02/16/2021 1000 Gross per 24 hour  Intake 552.59 ml  Output 500 ml  Net 52.59 ml   Filed Weights   02/16/21 0400  Weight: 98 kg    Examination: General: Average build, intubated sedated HEENT: R scalp hematoma and injected sclera, pupils minimally reactive, MM pink/moist, lip laceration noted Neuro: When sedation interrupted will wake up follow commands in all 4 limbs with normal strength.  Inattention with agitation. CV: s1s2 , no m/r/g PULM:  B/l bs rhonchi GI: soft, bsx4 active  Extremities: warm/dry, no edema  Skin: no rashes or lesions   Labs/imaging personally reviewed   3/19 [facility] >> Right lateral cerebellar intraparenchymal hemorrhage measuring 4 cc volume with mild surrounding edema.  Right lateral temporal intraparenchymal hematoma measuring 10 cc volume with mild surrounding  edema.  6 mm right temporal intraparenchymal hematoma adjacent to the temporal tip of the lateral ventricle. Mild mass effect with 1-2 cm of right-to-left shift.  No skull fracture or extra-axial bleed. These abnormalities were not visible in any way on the study 3 days ago.  Resolved Hospital Problem list    Assessment & Plan:  Critically ill due to acute  hypoxic/hypercarbic resp failure 2/2 ICH and inability to protect airway:  Acute PE status post half dose TPA 3/17, received first dose Eliquis 3/18 Extensive LE DVT:  Seizure-like activity but negative EEG ICH: Atypical appearance for traumatic bleed. etoh use disorder:   Plan:  -Continue full ventilatory support likely will tolerate extubation based on current mental status, but will keep intubated until testing completed -MRI with MR angio with contrast to elucidate cause of hemorrhages. -IVC filter placement until able to be anticoagulated. -No evidence of seizure activity so we will discontinue long-term EEG -No evidence of alcohol withdrawal. -Cervical collar removed as no bony injuries or displacement on CT spine.   Best practice (evaluated daily)  Diet:  NPO, will hold on initiation as patient well-nourished and likely will be extubated tomorrow. Pain/Anxiety/Delirium protocol (if indicated): Yes (RASS goal -1) VAP protocol (if indicated): Yes DVT prophylaxis: SCDs GI prophylaxis: PPI Glucose control:  na Central venous access:  N/A Family update: Patient's sister updated at bedside.  Labs   CBC: Recent Labs  Lab 02/13/21 1514 02/14/21 0531 02/15/21 0143 02/15/21 1509 02/15/21 1510 02/15/21 1638 02/16/21 0138  WBC 8.7 8.5 8.2  --  10.8*  --  7.6  NEUTROABS  --   --   --   --  8.1*  --   --   HGB 15.9 15.4 15.1 17.3* 16.3 15.6 13.8  HCT 46.4 45.3 43.9 51.0 50.2 46.0 40.1  MCV 98.7 99.1 98.2  --  102.2*  --  98.5  PLT 133* 146* 150  --  182  --  148*    Basic Metabolic Panel: Recent Labs  Lab 02/12/21 2200 02/12/21 2230 02/13/21 0624 02/14/21 0531 02/15/21 0143 02/15/21 1509 02/15/21 1510 02/15/21 1638 02/16/21 0138  NA  --    < > 136 136 135 137 137 139 137  K  --    < > 4.1 5.0 3.9 3.8 3.9 4.2 3.7  CL  --    < > 102 103 103 102 102  --  102  CO2  --    < > 24 26 25   --  25  --  25  GLUCOSE  --    < > 76 92 93 149* 146*  --  77  BUN  --    < > 8 6  6 6 6   --  6  CREATININE  --    < > 1.32* 1.18 1.15 1.10 1.25*  --  0.98  CALCIUM  --    < > 8.4* 8.2* 8.2*  --  8.9  --  8.2*  MG 1.5*  --   --   --   --   --   --   --   --    < > = values in this interval not displayed.   GFR: Estimated Creatinine Clearance: 105.2 mL/min (by C-G formula based on SCr of 0.98 mg/dL). Recent Labs  Lab 02/12/21 2223 02/13/21 0002 02/13/21 0624 02/14/21 0531 02/15/21 0143 02/15/21 1510 02/16/21 0138  WBC  --   --    < > 8.5 8.2 10.8* 7.6  LATICACIDVEN 5.6* 3.9*  --   --   --   --   --    < > = values in this interval not displayed.    Liver Function Tests: Recent Labs  Lab 02/12/21 2200 02/13/21 0624 02/14/21 0531 02/15/21 0143 02/15/21 1510  AST 80* 48* 67* 37 40  ALT 25 27 36 32 34  ALKPHOS 26* 35* 33* 39 40  BILITOT 1.1 2.0* 1.2 0.8 0.6  PROT 5.0* 6.2* 5.7* 5.6* 7.2  ALBUMIN 2.2* 2.6* 2.4* 2.3* 3.0*   Recent Labs  Lab 02/12/21 2200  LIPASE 26   Recent Labs  Lab 02/13/21 0008  AMMONIA 40*    ABG    Component Value Date/Time   PHART 7.257 (L) 02/15/2021 1638   PCO2ART 65.5 (HH) 02/15/2021 1638   PO2ART 97 02/15/2021 1638   HCO3 29.3 (H) 02/15/2021 1638   TCO2 31 02/15/2021 1638   ACIDBASEDEF 2.6 (H) 02/12/2021 2223   O2SAT 96.0 02/15/2021 1638     Coagulation Profile: Recent Labs  Lab 02/12/21 2358 02/13/21 0624 02/15/21 1510 02/16/21 0138  INR 1.2 1.8* 1.3* 1.1    Cardiac Enzymes: Recent Labs  Lab 02/13/21 0007  CKTOTAL 241    HbA1C: No results found for: HGBA1C  CBG: Recent Labs  Lab 02/15/21 1505 02/15/21 2303 02/16/21 0311 02/16/21 0334 02/16/21 0818  GLUCAP 149* 72 52* 171* 74   CRITICAL CARE Performed by: Lynnell Catalan   Total critical care time: 45 minutes  Critical care time was exclusive of separately billable procedures and treating other patients.  Critical care was necessary to treat or prevent imminent or life-threatening deterioration.  Critical care was time spent  personally by me on the following activities: development of treatment plan with patient and/or surrogate as well as nursing, discussions with consultants, evaluation of patient's response to treatment, examination of patient, obtaining history from patient or surrogate, ordering and performing treatments and interventions, ordering and review of laboratory studies, ordering and review of radiographic studies, pulse oximetry, re-evaluation of patient's condition and participation in multidisciplinary rounds.  Lynnell Catalan, MD [facility] ICU Physician [facility]  Pager: [phone] Mobile: [phone] After hours: [phone].

## 2021-02-16 NOTE — H&P (Signed)
Chief Complaint: DVT/PE/Intracranial hemorrhage   Referring Physician(s): Pearlean Brownie, MD  Supervising Physician: Oley Balm  Patient Status: [facility] - In-pt  History of Present Illness: Danny Lowe is a 50 y.o. male who was admitted on 3/16 after syncopal event and hitting his face. He was found to have a right frontal/ occipital hematoma.    He was also found to have unprovoked massive PE with right heart strain and extensive left DVT. He was treated with half dose tPA and heparin drip.    He was transitioned to Eliquis 3/18 and was discharged home.    He arrived as a code stroke in ER by EMS confused with reported seizure activity and worsening mental status after being found down in a [facility] parking lot.  CT showed right lateral cerebellar and temporal IPH with right to left shift 1-2 mm.    He is currently intubated. Sister at bedside.  We are asked to place an IVC Filter.  Past Medical History:  Diagnosis Date  . Depression     No past surgical history on file.  Allergies: Patient has no known allergies.  Medications: Prior to Admission medications   Medication Sig Start Date End Date Taking? Authorizing Provider  busPIRone (BUSPAR) 10 MG tablet Take 10 mg by mouth daily.   Yes [provider]  hydrOXYzine (VISTARIL) 25 MG capsule Take 25-50 mg by mouth 2 (two) times daily as needed (sleep).   Yes [provider]  sertraline (ZOLOFT) 100 MG tablet Take 150 mg by mouth daily.   Yes [provider]  traZODone (DESYREL) 100 MG tablet Take 100 mg by mouth at bedtime.   Yes [provider]  apixaban (ELIQUIS) 5 MG TABS tablet Take 2 tablets (10 mg total) by mouth 2 (two) times daily. 02/15/21   Mannam, Colbert Coyer, MD  apixaban (ELIQUIS) 5 MG TABS tablet Take 1 tablet (5 mg total) by mouth 2 (two) times daily. 02/21/21 05/22/21  Chilton Greathouse, MD     No family history on file.  Social History   Socioeconomic  History  . Marital status: Single    Spouse name: Not on file  . Number of children: Not on file  . Years of education: Not on file  . Highest education level: Not on file  Occupational History  . Not on file  Tobacco Use  . Smoking status: Never Smoker  . Smokeless tobacco: Current User    Types: Chew  Substance and Sexual Activity  . Alcohol use: Yes    Comment: 12 beers daily  . Drug use: Never  . Sexual activity: Not on file  Other Topics Concern  . Not on file  Social History Narrative  . Not on file   Social Determinants of Health   Financial Resource Strain: Not on file  Food Insecurity: Not on file  Transportation Needs: Not on file  Physical Activity: Not on file  Stress: Not on file  Social Connections: Not on file      Review of Systems  Unable to perform ROS: Intubated    Vital Signs: BP 97/71   Pulse 68   Temp 97.7 F (36.5 C) (Axillary)   Resp (!) 22   Wt 98 kg   SpO2 100%   BMI 31.91 kg/m   Physical Exam Vitals reviewed.  Constitutional:      Appearance: He is ill-appearing.     Comments: Intubated/sedated/vent  HENT:     Head:     Comments:  EEG leads inplace and bandage around head Neck:     Comments: C-Collar in place Cardiovascular:     Rate and Rhythm: Normal rate.  Pulmonary:     Comments: Vent Musculoskeletal:        General: Swelling present.     Comments: Spontaneous movements bil RUE.  Skin:    General: Skin is warm and dry.  Neurological:     Comments: Sedated/vent     Imaging: CT HEAD WO CONTRAST  Result Date: 02/15/2021 CLINICAL DATA:  Parenchymal hemorrhage, follow-up EXAM: CT HEAD WITHOUT CONTRAST TECHNIQUE: Contiguous axial images were obtained from the base of the skull through the vertex without intravenous contrast. COMPARISON:  Earlier today FINDINGS: Brain: Right lateral cerebellar hemorrhage measures 2.4 x 1.8 cm. Right temporal lobe hemorrhage measures 3.3 x 2.7 cm. 6 mm area of hemorrhage in the right  temporal lobe adjacent to the temporal horn of the lateral ventricle. These are unchanged since prior study. No significant right to left midline shift. No hydrocephalus. No new areas of hemorrhage. Vascular: No hyperdense vessel or unexpected calcification. Skull: No acute calvarial abnormality. Sinuses/Orbits: No acute findings Other: None IMPRESSION: Stable areas of hemorrhage in the right temporal lobe and right cerebellum. No new areas of hemorrhage. Electronically Signed   By: Charlett Nose M.D.   On: 02/15/2021 21:33   CT Head Wo Contrast  Result Date: 02/13/2021 CLINICAL DATA:  Initial evaluation for acute trauma, fall. EXAM: CT HEAD WITHOUT CONTRAST TECHNIQUE: Contiguous axial images were obtained from the base of the skull through the vertex without intravenous contrast. COMPARISON:  None. FINDINGS: Brain: Cerebral volume within normal limits. No acute intracranial hemorrhage. No acute large vessel territory infarct. No mass lesion, midline shift or mass effect. No hydrocephalus or extra-axial fluid collection. Vascular: No hyperdense vessel. Skull: Small soft tissue contusion present at the right parieto-occipital scalp. Calvarium intact. Sinuses/Orbits: Globes and orbital soft tissues within normal limits. Visualized paranasal sinuses are clear. No mastoid effusion. Other: None. IMPRESSION: 1. No acute intracranial abnormality. 2. Small right parieto-occipital scalp contusion. No calvarial fracture. Electronically Signed   By: Rise Mu M.D.   On: 02/13/2021 00:26   CT CHEST W CONTRAST  Result Date: 02/15/2021 CLINICAL DATA:  Fall 3 days ago. EXAM: CT CHEST, ABDOMEN, AND PELVIS WITH CONTRAST TECHNIQUE: Multidetector CT imaging of the chest, abdomen and pelvis was performed following the standard protocol during bolus administration of intravenous contrast. CONTRAST:  OMNIPAQUE IOHEXOL 300 MG/ML  SOLN COMPARISON:  CT angio chest 02/12/2021 FINDINGS: CHEST: Ports and Devices: The  endotracheal tube terminates 4 cm above the carina. The enteric tube courses below the hemidiaphragm with tip and side port within the gastric lumen. Lungs/airways: Interval increase in left lower lobe hypodense consolidation. Linear atelectasis and subsegmental atelectasis of the right upper lobe. There is a 4 mm nodule within the right upper lobe. No pulmonary mass. No pulmonary contusion or laceration. No pneumatocele formation. The central airways are patent. Pleura: No pleural effusion. No pneumothorax. No hemothorax. Lymph Nodes: No mediastinal, hilar, or axillary lymphadenopathy. Mediastinum: No pneumomediastinum. No aortic injury or mediastinal hematoma. The thoracic aorta is normal in caliber. Improved but persistent increased right to left ventricular ratio. The heart is otherwise normal in size. No longer paradoxical bowing of the interventricular septum however still noted to be straightening. No significant pericardial effusion. Slightly improved but persistently extensive bilateral central pulmonary emboli that extend to all pulmonary lobes. The esophagus is unremarkable. The thyroid is unremarkable. Chest Wall /  Breasts: Marked bilateral gynecomastia that appears to be asymmetric, left greater than right. Musculoskeletal: No acute displaced costochondral junction fracture. No acute displaced rib or sternal fracture. No spinal fracture. ABDOMEN / PELVIS: Liver: Not enlarged. No focal lesion. No laceration or subcapsular hematoma. Biliary System: The gallbladder is otherwise unremarkable with no radio-opaque gallstones. No biliary ductal dilatation. Pancreas: Normal pancreatic contour. No main pancreatic duct dilatation. Spleen: Not enlarged. No focal lesion. No laceration, subcapsular hematoma, or vascular injury. Adrenal Glands: No nodularity bilaterally. Kidneys: Bilateral kidneys enhance symmetrically. No hydronephrosis. No contusion, laceration, or subcapsular hematoma. No injury to the vascular  structures or collecting systems. No hydroureter. The urinary bladder is decompressed with a urinary Foley catheter terminating within the urinary bladder lumen. Bowel: No small or large bowel wall thickening or dilatation. Mesentery, Omentum, and Peritoneum: No simple free fluid ascites. No pneumoperitoneum. No hemoperitoneum. No mesenteric hematoma identified. No organized fluid collection. Pelvic Organs: Normal. Lymph Nodes: No abdominal, pelvic, inguinal lymphadenopathy. Vasculature: No abdominal aorta or iliac aneurysm. No active contrast extravasation or pseudoaneurysm. Musculoskeletal: Soft tissue edema of bilateral/flanks with small hematoma formation along the right flank at the level of the gluteal soft tissues (3:104). No acute pelvic fracture. No spinal fracture. IMPRESSION: 1. Slightly improved but persistently extensive bilateral central pulmonary emboli that extend to all pulmonary lobes. Persistent but improved secondary findings of right heart strain. 2. Interval increase in left lower lobe hypodense consolidation likely representing infection/inflammation. 3. Marked bilateral gynecomastia that appears to be asymmetric, left greater than right. Given asymmetry and patient age/gender, recommend correlation with mammography to exclude an underlying lesion. 4. Indeterminate 4 mm right upper lobe nodule. 5. No acute traumatic injury to the chest, abdomen, or pelvis. 6. No acute fracture or traumatic malalignment of the thoracic or lumbar spine. 7. Lines and tubes in appropriate position. Electronically Signed   By: Tish Frederickson M.D.   On: 02/15/2021 21:40   CT Angio Chest PE W and/or Wo Contrast  Result Date: 02/13/2021 CLINICAL DATA:  Initial evaluation for acute syncope, concern for PE. EXAM: CT ANGIOGRAPHY CHEST WITH CONTRAST TECHNIQUE: Multidetector CT imaging of the chest was performed using the standard protocol during bolus administration of intravenous contrast. Multiplanar CT image  reconstructions and MIPs were obtained to evaluate the vascular anatomy. CONTRAST:  75mL OMNIPAQUE IOHEXOL 350 MG/ML SOLN COMPARISON:  None. FINDINGS: Cardiovascular: Intrathoracic aorta normal in caliber without acute abnormality. Visualized great vessels within normal limits. Heart size mildly enlarged.  No pericardial effusion. Pulmonary arterial tree adequately opacified for evaluation. Extensive acute pulmonary emboli seen within the bilateral mainstem and proximal lobar pulmonary arteries, with involvement of all lobes. No frank saddle pulmonary embolus. Main pulmonary artery mildly dilated up to 3.1 cm. Elevated RV to LV ratio of approximately 2.0 with mild right-to-left bowing of the intraventricular septum, consistent with right heart strain. Reflux of contrast material into the IVC and hepatic veins also consistent with right heart strain. Probable filling defect within the visualized IVC, concerning for thrombus (series 5, image 123). Inferiorly, the partially visualized IVC is enlarged/dilated with associated stranding, also suspicious for acute thrombosis (series 5, image 154). Mediastinum/Nodes: Thyroid within normal limits. No pathologically enlarged mediastinal, hilar, or axillary lymph nodes. Esophagus within normal limits. Lungs/Pleura: Tracheobronchial tree intact and patent. Small layering left pleural effusion. Associated left basilar consolidative opacity could reflect atelectasis or possibly pulmonary infarct. Mild atelectatic changes noted along the right minor fissure and lingula as well. Lungs are otherwise clear. No pulmonary edema. No  pneumothorax. No worrisome pulmonary nodule or mass. Upper Abdomen: Visualized upper abdomen demonstrates no other acute finding. Musculoskeletal: Probable 1.4 cm sebaceous cyst noted within the subcutaneous fat of the lower mid back (series 5, image 145). Visualized external soft tissues demonstrate no other acute finding. No acute osseous finding. No  discrete or worrisome osseous lesions. Review of the MIP images confirms the above findings. IMPRESSION: 1. Extensive acute pulmonary emboli involving the bilateral mainstem and proximal lobar pulmonary arteries. Evidence of right heart strain (RV/LV Ratio 2.0) consistent with at least submassive (intermediate risk) PE. The presence of right heart strain has been associated with an increased risk of morbidity and mortality. 2. Probable filling defect within the visualized IVC, concerning for thrombus. 3. Small layering left pleural effusion with associated left basilar consolidative opacity, which could reflect atelectasis and/or pulmonary infarct. Critical Value/emergent results were discussed by telephone at the time of interpretation on 02/12/2021 at 11:42 pm to provider [facility] , who verbally acknowledged these results. Electronically Signed   By: Rise Mu M.D.   On: 02/13/2021 00:18   CT CERVICAL SPINE WO CONTRAST  Result Date: 02/15/2021 CLINICAL DATA:  Neck trauma EXAM: CT CERVICAL SPINE WITHOUT CONTRAST TECHNIQUE: Multidetector CT imaging of the cervical spine was performed without intravenous contrast. Multiplanar CT image reconstructions were also generated. COMPARISON:  None. FINDINGS: Alignment: Normal Skull base and vertebrae: No acute fracture. No primary bone lesion or focal pathologic process. Soft tissues and spinal canal: No prevertebral fluid or swelling. No visible canal hematoma. Disc levels: Degenerative disc disease from C5-6 through C6-7. Mild bilateral degenerative facet disease. Upper chest: Airspace disease in the right upper lobe with right pleural effusion partially imaged. Other: Endotracheal tube and NG tube are noted in place. IMPRESSION: No acute bony abnormality in the cervical spine. Cervical spondylosis. Right pleural effusion and right upper lobe airspace disease partially imaged peer Electronically Signed   By: Charlett Nose M.D.   On: 02/15/2021 21:28    CT ABDOMEN PELVIS W CONTRAST  Result Date: 02/15/2021 CLINICAL DATA:  Fall 3 days ago. EXAM: CT CHEST, ABDOMEN, AND PELVIS WITH CONTRAST TECHNIQUE: Multidetector CT imaging of the chest, abdomen and pelvis was performed following the standard protocol during bolus administration of intravenous contrast. CONTRAST:  OMNIPAQUE IOHEXOL 300 MG/ML  SOLN COMPARISON:  CT angio chest 02/12/2021 FINDINGS: CHEST: Ports and Devices: The endotracheal tube terminates 4 cm above the carina. The enteric tube courses below the hemidiaphragm with tip and side port within the gastric lumen. Lungs/airways: Interval increase in left lower lobe hypodense consolidation. Linear atelectasis and subsegmental atelectasis of the right upper lobe. There is a 4 mm nodule within the right upper lobe. No pulmonary mass. No pulmonary contusion or laceration. No pneumatocele formation. The central airways are patent. Pleura: No pleural effusion. No pneumothorax. No hemothorax. Lymph Nodes: No mediastinal, hilar, or axillary lymphadenopathy. Mediastinum: No pneumomediastinum. No aortic injury or mediastinal hematoma. The thoracic aorta is normal in caliber. Improved but persistent increased right to left ventricular ratio. The heart is otherwise normal in size. No longer paradoxical bowing of the interventricular septum however still noted to be straightening. No significant pericardial effusion. Slightly improved but persistently extensive bilateral central pulmonary emboli that extend to all pulmonary lobes. The esophagus is unremarkable. The thyroid is unremarkable. Chest Wall / Breasts: Marked bilateral gynecomastia that appears to be asymmetric, left greater than right. Musculoskeletal: No acute displaced costochondral junction fracture. No acute displaced rib or sternal fracture. No spinal fracture.  ABDOMEN / PELVIS: Liver: Not enlarged. No focal lesion. No laceration or subcapsular hematoma. Biliary System: The gallbladder is  otherwise unremarkable with no radio-opaque gallstones. No biliary ductal dilatation. Pancreas: Normal pancreatic contour. No main pancreatic duct dilatation. Spleen: Not enlarged. No focal lesion. No laceration, subcapsular hematoma, or vascular injury. Adrenal Glands: No nodularity bilaterally. Kidneys: Bilateral kidneys enhance symmetrically. No hydronephrosis. No contusion, laceration, or subcapsular hematoma. No injury to the vascular structures or collecting systems. No hydroureter. The urinary bladder is decompressed with a urinary Foley catheter terminating within the urinary bladder lumen. Bowel: No small or large bowel wall thickening or dilatation. Mesentery, Omentum, and Peritoneum: No simple free fluid ascites. No pneumoperitoneum. No hemoperitoneum. No mesenteric hematoma identified. No organized fluid collection. Pelvic Organs: Normal. Lymph Nodes: No abdominal, pelvic, inguinal lymphadenopathy. Vasculature: No abdominal aorta or iliac aneurysm. No active contrast extravasation or pseudoaneurysm. Musculoskeletal: Soft tissue edema of bilateral/flanks with small hematoma formation along the right flank at the level of the gluteal soft tissues (3:104). No acute pelvic fracture. No spinal fracture. IMPRESSION: 1. Slightly improved but persistently extensive bilateral central pulmonary emboli that extend to all pulmonary lobes. Persistent but improved secondary findings of right heart strain. 2. Interval increase in left lower lobe hypodense consolidation likely representing infection/inflammation. 3. Marked bilateral gynecomastia that appears to be asymmetric, left greater than right. Given asymmetry and patient age/gender, recommend correlation with mammography to exclude an underlying lesion. 4. Indeterminate 4 mm right upper lobe nodule. 5. No acute traumatic injury to the chest, abdomen, or pelvis. 6. No acute fracture or traumatic malalignment of the thoracic or lumbar spine. 7. Lines and tubes in  appropriate position. Electronically Signed   By: Tish Frederickson M.D.   On: 02/15/2021 21:40   DG Chest Portable 1 View  Result Date: 02/15/2021 CLINICAL DATA:  ET tube placement EXAM: PORTABLE CHEST 1 VIEW COMPARISON:  None. FINDINGS: Endotracheal tube is 4 cm above the carina. NG tube enters the stomach. Cardiomegaly, vascular congestion. Bilateral upper lobe airspace opacities, new since prior CT. No visible effusions or pneumothorax. No acute bony abnormality. IMPRESSION: ET tube approximately 4.4 cm above the carina. Cardiomegaly, vascular congestion. New bilateral upper lobe airspace opacities. Electronically Signed   By: Charlett Nose M.D.   On: 02/15/2021 16:53   DG Shoulder Right Port  Result Date: 02/13/2021 CLINICAL DATA:  Syncope, fall, right shoulder pain EXAM: PORTABLE RIGHT SHOULDER COMPARISON:  None. FINDINGS: No glenohumeral dislocation. No evidence of acromioclavicular separation. Mild acromioclavicular joint osteoarthritis. No significant glenohumeral joint arthropathy. No fracture. No focal osseous lesions. No radiopaque foreign bodies or pathologic soft tissue calcifications. IMPRESSION: No right shoulder fracture or malalignment. Mild right AC joint osteoarthritis. Electronically Signed   By: Delbert Phenix M.D.   On: 02/13/2021 14:30   Overnight EEG with video  Result Date: 02/16/2021 Lind Guest, MD     02/16/2021  8:20 AM EEG Procedure CPT/Type of Study: 95720; 24hr EEG with video Referring Provider: Gaynell Face Primary Neurological Diagnosis: ICH History: This is a 50 yr old patient, undergoing an EEG to evaluate for ICH, possible seizure. Clinical State: obtunded Technical Description: The EEG was performed using standard setting per the guidelines of American Clinical Neurophysiology Society (ACNS). A minimum of 21 electrodes were placed on scalp according to the International 10-20 or/and 10-10 Systems. Supplemental electrodes were placed as needed. Single EKG electrode was also  used to detect cardiac arrhythmia. Patient's behavior was continuously recorded on video simultaneously with EEG. A minimum of  16 channels were used for data display. Each epoch of study was reviewed manually daily and as needed using standard referential and bipolar montages. Computerized quantitative EEG analysis (such as compressed spectral array analysis, trending, automated spike & seizure detection) were used as indicated. Day 1: from 1937 02/15/21 to 0740 02/16/21 EEG Description: Overall Amplitude:Normal Predominant Frequency: The background activity showed theta-delta activity without a posterior dominant rhythm, primarily a sedated background. Superimposed Frequencies: sparse beta activity bilaterally The background was asymmetric with increased focal slowing and loss of detail over the right temporal region. Background Abnormalities: Focal slowing; right temporal focal slowing and loss of detail Rhythmic or periodic pattern: Yes; lateralized periodic discharges: right temporal 1hz  sharp LPDs in runs but without ictal spread, occurring occasionally Epileptiform activity: Yes; sharp morphology to right temporal LPDs Electrographic seizures: no Events: no Breach rhythm: no Reactivity: Present Stimulation procedures: Hyperventilation: not done Photic stimulation: not done Sleep Background: Stage II EKG:no significant arrhythmia Impression: This was an abnormal continuous video EEG due to a slow, sedated background with focal slowing and LPDs in the right temporal region, 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 No seizures were seen.   ECHOCARDIOGRAM COMPLETE  Result Date: 02/13/2021    ECHOCARDIOGRAM REPORT   Patient Name:   Danny Lowe Date of Exam: 02/13/2021 Medical Rec #:  02/15/2021         Height:       69.0 in Accession #:    [id number]        Weight:       205.0 lb Date of Birth:  1971/09/24        BSA:          2.088 m Patient Age:    49 years           BP:           111/82 mmHg Patient Gender: M                 HR:           82 bpm. Exam Location:  Inpatient Procedure: 2D Echo, 3D Echo, Cardiac Doppler, Color Doppler and Strain Analysis Indications:    Pulmonary Embolus  History:        Patient has no prior history of Echocardiogram examinations.  Sonographer:    09-24-1972 RDCS Referring Phys: Neomia Dear [id number] MEIER IMPRESSIONS  1. Left ventricular ejection fraction, by estimation, is 60 to 65%. The left ventricle has normal function. The left ventricle has no regional wall motion abnormalities. Left ventricular diastolic parameters are indeterminate. There is the interventricular septum is flattened in diastole ('D' shaped left ventricle), consistent with right ventricular volume overload.  2. The RV apex is not seen very well but in some views, the apex can be seen to contract fairly well with severe dilatation and hypocontractility of the mid-basal RV ( McConnells sign). This is consistent with RV strain from pulmonary embolus. . . Right  ventricular systolic function is moderately reduced. The right ventricular size is moderately enlarged. There is mildly elevated pulmonary artery systolic pressure. The estimated right ventricular systolic pressure is 38.9 mmHg.  3. Right atrial size was mildly dilated.  4. The mitral valve is normal in structure. Trivial mitral valve regurgitation.  5. The aortic valve is normal in structure. Aortic valve regurgitation is not visualized. No aortic stenosis is present. FINDINGS  Left Ventricle: Left ventricular ejection fraction, by estimation, is 60 to 65%. The left  ventricle has normal function. The left ventricle has no regional wall motion abnormalities. The left ventricular internal cavity size was normal in size. There is  no left ventricular hypertrophy. The interventricular septum is flattened in diastole ('D' shaped left ventricle), consistent with right ventricular volume overload. Left ventricular diastolic  parameters are indeterminate. Right Ventricle: The RV apex is not seen very well but in some views, the apex can be seen to contract fairly well with severe dilatation and hypocontractility of the mid-basal RV ( McConnells sign). This is consistent with RV strain from pulmonary embolus. The right ventricular size is moderately enlarged. Right vetricular wall thickness was not well visualized. Right ventricular systolic function is moderately reduced. There is mildly elevated pulmonary artery systolic pressure. The tricuspid regurgitant velocity is 2.78 m/s, and with an assumed right atrial pressure of 8 mmHg, the estimated right ventricular systolic pressure is 38.9 mmHg. Left Atrium: Left atrial size was normal in size. Right Atrium: Right atrial size was mildly dilated. Pericardium: There is no evidence of pericardial effusion. Mitral Valve: The mitral valve is normal in structure. Trivial mitral valve regurgitation. MV peak gradient, 2.4 mmHg. The mean mitral valve gradient is 1.0 mmHg. Tricuspid Valve: The tricuspid valve is grossly normal. Tricuspid valve regurgitation is trivial. Aortic Valve: The aortic valve is normal in structure. Aortic valve regurgitation is not visualized. No aortic stenosis is present. Aortic valve mean gradient measures 3.0 mmHg. Aortic valve peak gradient measures 4.8 mmHg. Aortic valve area, by VTI measures 3.24 cm. Pulmonic Valve: The pulmonic valve was normal in structure. Pulmonic valve regurgitation is not visualized. Aorta: The aortic root and ascending aorta are structurally normal, with no evidence of dilitation. IAS/Shunts: The atrial septum is grossly normal.  LEFT VENTRICLE PLAX 2D LVIDd:         3.70 cm      Diastology LVIDs:         2.60 cm      LV e' medial:    5.11 cm/s LV PW:         1.40 cm      LV E/e' medial:  11.6 LV IVS:        2.00 cm      LV e' lateral:   5.33 cm/s LVOT diam:     2.40 cm      LV E/e' lateral: 11.1 LV SV:         54 LV SV Index:   26 LVOT Area:      4.52 cm  LV Volumes (MOD) LV vol d, MOD A2C: 84.0 ml LV vol d, MOD A4C: 112.0 ml LV vol s, MOD A2C: 34.6 ml LV vol s, MOD A4C: 36.2 ml LV SV MOD A2C:     49.4 ml LV SV MOD A4C:     112.0 ml LV SV MOD BP:      62.8 ml RIGHT VENTRICLE RV S prime:     10.90 cm/s  PULMONARY VEINS TAPSE (M-mode): 1.2 cm      A Reversal Duration: 79.00 msec                             A Reversal Velocity: 20.40 cm/s                             Diastolic Velocity:  33.70 cm/s  S/D Velocity:        1.30                             Systolic Velocity:   43.40 cm/s LEFT ATRIUM             Index       RIGHT ATRIUM           Index LA diam:        2.40 cm 1.15 cm/m  RA Area:     20.60 cm LA Vol (A2C):   20.5 ml 9.82 ml/m  RA Volume:   65.90 ml  31.56 ml/m LA Vol (A4C):   41.1 ml 19.68 ml/m LA Biplane Vol: 30.7 ml 14.70 ml/m  AORTIC VALVE                   PULMONIC VALVE AV Area (Vmax):    3.62 cm    PV Vmax:       0.65 m/s AV Area (Vmean):   3.58 cm    PV Peak grad:  1.7 mmHg AV Area (VTI):     3.24 cm AV Vmax:           110.00 cm/s AV Vmean:          77.700 cm/s AV VTI:            0.166 m AV Peak Grad:      4.8 mmHg AV Mean Grad:      3.0 mmHg LVOT Vmax:         87.90 cm/s LVOT Vmean:        61.500 cm/s LVOT VTI:          0.119 m LVOT/AV VTI ratio: 0.72  AORTA Ao Root diam: 3.30 cm Ao Asc diam:  2.90 cm MITRAL VALVE               TRICUSPID VALVE MV Area (PHT): 4.57 cm    TR Peak grad:   30.9 mmHg MV Area VTI:   3.22 cm    TR Vmax:        278.00 cm/s MV Peak grad:  2.4 mmHg MV Mean grad:  1.0 mmHg    SHUNTS MV Vmax:       0.77 m/s    Systemic VTI:  0.12 m MV Vmean:      48.3 cm/s   Systemic Diam: 2.40 cm MV Decel Time: 166 msec MV E velocity: 59.10 cm/s MV A velocity: 69.80 cm/s MV E/A ratio:  0.85 Kristeen MissPhilip Nahser MD Electronically signed by Kristeen MissPhilip Nahser MD Signature Date/Time: 02/13/2021/3:21:59 PM    Final    CT HEAD CODE STROKE WO CONTRAST  Result Date: 02/15/2021 CLINICAL DATA:  Code stroke. Acute  presentation with left-sided weakness. By previous history, there was a fall with head trauma 3 days ago. EXAM: CT HEAD WITHOUT CONTRAST TECHNIQUE: Contiguous axial images were obtained from the base of the skull through the vertex without intravenous contrast. COMPARISON:  02/12/2021 FINDINGS: Brain: Right lateral cerebellar intraparenchymal hemorrhage measuring 2.8 x 1.8 x 1.5 cm (volume = 4 cm^3). Mild surrounding edema. No other posterior fossa abnormality. 6 mm intraparenchymal hemorrhage adjacent to the lateral margin of the right temporal tip. 3.2 x 2.8 x 2.1 cm (volume = 9.9 cm^3) hematoma in the right temporal lobe just above the temporal bone with surrounding edema. Minor mass effect. Right-to-left shift of 1 or 2 mm. No sign of acute  ischemic infarction. No mass, hydrocephalus or extra-axial hematoma. Vascular: No visible vascular abnormality. Skull: Normal Sinuses/Orbits: Clear/normal Other: None IMPRESSION: Right lateral cerebellar intraparenchymal hemorrhage measuring 4 cc volume with mild surrounding edema. Right lateral temporal intraparenchymal hematoma measuring 10 cc volume with mild surrounding edema. 6 mm right temporal intraparenchymal hematoma adjacent to the temporal tip of the lateral ventricle. Mild mass effect with 1-2 cm of right-to-left shift. No skull fracture or extra-axial bleed. These abnormalities were not visible in any way on the study 3 days ago. These results were communicated to Dr. Selina Cooley At 3:27 pmon 3/19/2022by text page via the [facility] messaging system. Electronically Signed   By: Paulina Fusi M.D.   On: 02/15/2021 15:28   VAS [place] LOWER EXTREMITY VENOUS (DVT)  Result Date: 02/13/2021  Lower Venous DVT Study Indications: SOB, and Massive pulmonary embolism, syncope. Thrombus noted in IVC by CT scan.  Risk Factors: ETOH abuse. Comparison Study: No prior study Performing Technologist: Sherren Kerns RVS  Examination Guidelines: A complete evaluation includes B-mode imaging,  spectral Doppler, color Doppler, and power Doppler as needed of all accessible portions of each vessel. Bilateral testing is considered an integral part of a complete examination. Limited examinations for reoccurring indications may be performed as noted. The reflux portion of the exam is performed with the patient in reverse Trendelenburg.  +---------+---------------+---------+-----------+----------+--------------+ RIGHT    CompressibilityPhasicitySpontaneityPropertiesThrombus Aging +---------+---------------+---------+-----------+----------+--------------+ CFV      Full           Yes      Yes                  sluggish flow  +---------+---------------+---------+-----------+----------+--------------+ SFJ      Full                                                        +---------+---------------+---------+-----------+----------+--------------+ FV Prox  Full           Yes      Yes                  sluggish flow  +---------+---------------+---------+-----------+----------+--------------+ FV Mid   Full                                                        +---------+---------------+---------+-----------+----------+--------------+ FV DistalFull                                                        +---------+---------------+---------+-----------+----------+--------------+ PFV      Full                                                        +---------+---------------+---------+-----------+----------+--------------+ POP      Full           No       No  sluggish flow  +---------+---------------+---------+-----------+----------+--------------+ PTV      Full                                                        +---------+---------------+---------+-----------+----------+--------------+ PERO     Full                                                        +---------+---------------+---------+-----------+----------+--------------+    +---------+---------------+---------+-----------+----------+--------------+ LEFT     CompressibilityPhasicitySpontaneityPropertiesThrombus Aging +---------+---------------+---------+-----------+----------+--------------+ CFV      Full           Yes      Yes                                 +---------+---------------+---------+-----------+----------+--------------+ SFJ      Full                                                        +---------+---------------+---------+-----------+----------+--------------+ FV Prox  Partial        No       No                   Acute          +---------+---------------+---------+-----------+----------+--------------+ FV Mid   Partial        No       No                   Acute          +---------+---------------+---------+-----------+----------+--------------+ FV DistalPartial        No       No                   Acute          +---------+---------------+---------+-----------+----------+--------------+ PFV      Full                                                        +---------+---------------+---------+-----------+----------+--------------+ POP      Partial        No       No                   Acute          +---------+---------------+---------+-----------+----------+--------------+ PTV      None                                                        +---------+---------------+---------+-----------+----------+--------------+ PERO     None                                                        +---------+---------------+---------+-----------+----------+--------------+  Summary: RIGHT: - There is no evidence of deep vein thrombosis in the lower extremity.  LEFT: - Findings consistent with acute deep vein thrombosis involving the left femoral vein, left popliteal vein, left posterior tibial veins, and left peroneal veins.  *See table(s) above for measurements and observations. Electronically signed by Lemar Livings  MD on 02/13/2021 at 6:01:09 PM.    Final    CT Maxillofacial Wo Contrast  Result Date: 02/13/2021 CLINICAL DATA:  Initial evaluation for acute trauma, fall. EXAM: CT MAXILLOFACIAL WITHOUT CONTRAST TECHNIQUE: Multidetector CT imaging of the maxillofacial structures was performed. Multiplanar CT image reconstructions were also generated. COMPARISON:  None. FINDINGS: Osseous: Zygomatic arches intact. No acute maxillary fracture. Pterygoid plates intact. Nasal bones intact. Nasal septum midline and intact. Mandible intact. Individual condyles normally situated. No acute abnormality about the dentition. Orbits: Globes normal soft tissues within normal limits. Bony orbits intact. Sinuses: Paranasal sinuses are clear. Soft tissues: No appreciable soft tissue injury about the face. Few scattered subtle foci of soft tissue emphysema favored to lie within the venous system, likely related IV access. Limited intracranial: Unremarkable. IMPRESSION: No acute maxillofacial injury identified. No fracture. Electronically Signed   By: Rise Mu M.D.   On: 02/13/2021 00:31    Labs:  CBC: Recent Labs    02/14/21 0531 02/15/21 0143 02/15/21 1509 02/15/21 1510 02/15/21 1638 02/16/21 0138  WBC 8.5 8.2  --  10.8*  --  7.6  HGB 15.4 15.1 17.3* 16.3 15.6 13.8  HCT 45.3 43.9 51.0 50.2 46.0 40.1  PLT 146* 150  --  182  --  148*    COAGS: Recent Labs    02/12/21 2358 02/13/21 0624 02/15/21 1510 02/16/21 0138  INR 1.2 1.8* 1.3* 1.1  APTT 21* 48* 24  --     BMP: Recent Labs    02/14/21 0531 02/15/21 0143 02/15/21 1509 02/15/21 1510 02/15/21 1638 02/16/21 0138  NA 136 135 137 137 139 137  K 5.0 3.9 3.8 3.9 4.2 3.7  CL 103 103 102 102  --  102  CO2 26 25  --  25  --  25  GLUCOSE 92 93 149* 146*  --  77  BUN 6 6 6 6   --  6  CALCIUM 8.2* 8.2*  --  8.9  --  8.2*  CREATININE 1.18 1.15 1.10 1.25*  --  0.98  GFRNONAA >60 >60  --  >60  --  >60    LIVER FUNCTION TESTS: Recent Labs     02/13/21 0624 02/14/21 0531 02/15/21 0143 02/15/21 1510  BILITOT 2.0* 1.2 0.8 0.6  AST 48* 67* 37 40  ALT 27 36 32 34  ALKPHOS 35* 33* 39 40  PROT 6.2* 5.7* 5.6* 7.2  ALBUMIN 2.6* 2.4* 2.3* 3.0*    TUMOR MARKERS: No results for input(s): AFPTM, CEA, CA199, CHROMGRNA in the last 8760 hours.  Assessment and Plan:  DVT/PE   Unable to anticoagulated due to right lateral cerebellar hemorrhage.  Risks and benefits discussed with the patient's sister including, but not limited to bleeding, infection, contrast induced renal failure, filter fracture or migration which can lead to emergency surgery or even death, strut penetration with damage or irritation to adjacent structures and caval thrombosis.  All questions were answered, patient's sister is agreeable to proceed. Consent signed and in chart.  Will proceed with venogram and placement of inferior vena cava filter today by Dr. Deanne Coffer.  Thank you for this interesting consult.  I greatly enjoyed meeting [address]  [place] and look forward to participating in their care.  A copy of this report was sent to the requesting provider on this date.  Electronically Signed: Gwynneth Macleod, PA-C   02/16/2021, 8:23 AM      I spent a total of 20 Minutes in face to face in clinical consultation, greater than 50% of which was counseling/coordinating care for IVC Filter placement.

## 2021-02-17 ENCOUNTER — Inpatient Hospital Stay (HOSPITAL_COMMUNITY): Payer: 59

## 2021-02-17 DIAGNOSIS — J9602 Acute respiratory failure with hypercapnia: Secondary | ICD-10-CM

## 2021-02-17 DIAGNOSIS — I2602 Saddle embolus of pulmonary artery with acute cor pulmonale: Secondary | ICD-10-CM | POA: Diagnosis not present

## 2021-02-17 DIAGNOSIS — J9601 Acute respiratory failure with hypoxia: Secondary | ICD-10-CM

## 2021-02-17 DIAGNOSIS — I616 Nontraumatic intracerebral hemorrhage, multiple localized: Secondary | ICD-10-CM | POA: Diagnosis not present

## 2021-02-17 DIAGNOSIS — I619 Nontraumatic intracerebral hemorrhage, unspecified: Secondary | ICD-10-CM | POA: Diagnosis not present

## 2021-02-17 DIAGNOSIS — I82439 Acute embolism and thrombosis of unspecified popliteal vein: Secondary | ICD-10-CM

## 2021-02-17 LAB — PHOSPHORUS: Phosphorus: 3.5 mg/dL (ref 2.5–4.6)

## 2021-02-17 LAB — GLUCOSE, CAPILLARY
Glucose-Capillary: 112 mg/dL — ABNORMAL HIGH (ref 70–99)
Glucose-Capillary: 114 mg/dL — ABNORMAL HIGH (ref 70–99)
Glucose-Capillary: 81 mg/dL (ref 70–99)
Glucose-Capillary: 86 mg/dL (ref 70–99)
Glucose-Capillary: 92 mg/dL (ref 70–99)
Glucose-Capillary: 94 mg/dL (ref 70–99)

## 2021-02-17 LAB — CBC
HCT: 45.3 % (ref 39.0–52.0)
Hemoglobin: 15.2 g/dL (ref 13.0–17.0)
MCH: 33.5 pg (ref 26.0–34.0)
MCHC: 33.6 g/dL (ref 30.0–36.0)
MCV: 99.8 fL (ref 80.0–100.0)
Platelets: 150 K/uL (ref 150–400)
RBC: 4.54 MIL/uL (ref 4.22–5.81)
RDW: 14.7 % (ref 11.5–15.5)
WBC: 9.1 K/uL (ref 4.0–10.5)
nRBC: 0 % (ref 0.0–0.2)

## 2021-02-17 LAB — MAGNESIUM: Magnesium: 1.9 mg/dL (ref 1.7–2.4)

## 2021-02-17 LAB — BASIC METABOLIC PANEL WITH GFR
Anion gap: 7 (ref 5–15)
BUN: 8 mg/dL (ref 6–20)
CO2: 25 mmol/L (ref 22–32)
Calcium: 8.1 mg/dL — ABNORMAL LOW (ref 8.9–10.3)
Chloride: 102 mmol/L (ref 98–111)
Creatinine, Ser: 1.19 mg/dL (ref 0.61–1.24)
GFR, Estimated: >60 mL/min
Glucose, Bld: 90 mg/dL (ref 70–99)
Potassium: 3.9 mmol/L (ref 3.5–5.1)
Sodium: 134 mmol/L — ABNORMAL LOW (ref 135–145)

## 2021-02-17 MED ORDER — DEXMEDETOMIDINE HCL IN NACL 400 MCG/100ML IV SOLN: 0.2000 ug/kg/h | INTRAVENOUS | Status: DC

## 2021-02-17 MED ORDER — LORAZEPAM 1 MG PO TABS: 1.0000 mg | ORAL_TABLET | ORAL | Status: DC | PRN

## 2021-02-17 MED ORDER — LORAZEPAM 2 MG/ML IJ SOLN
1.0000 mg | INTRAMUSCULAR | Status: DC | PRN
Start: 2021-02-17 — End: 2021-02-20
  Administered 2021-02-17: 2 mg via INTRAVENOUS
  Administered 2021-02-18: 1 mg via INTRAVENOUS
  Filled 2021-02-17: qty 1

## 2021-02-17 MED ORDER — THIAMINE HCL 100 MG/ML IJ SOLN: 100.0000 mg | Freq: Every day | INTRAMUSCULAR | Status: DC

## 2021-02-17 MED ORDER — LORAZEPAM 2 MG/ML IJ SOLN
INTRAMUSCULAR | Status: AC
  Filled 2021-02-17: qty 1

## 2021-02-17 MED ORDER — SODIUM CHLORIDE 0.9 % IV SOLN
1.0000 mg | Freq: Every day | INTRAVENOUS | Status: DC
  Filled 2021-02-17: qty 0.2

## 2021-02-17 MED ORDER — PANTOPRAZOLE SODIUM 40 MG PO TBEC
40.0000 mg | DELAYED_RELEASE_TABLET | Freq: Every day | ORAL | Status: DC
  Administered 2021-02-17 – 2021-02-19 (×3): 40 mg via ORAL
  Filled 2021-02-17 (×3): qty 1

## 2021-02-17 MED ORDER — LORAZEPAM 2 MG/ML IJ SOLN: 1.0000 mg | INTRAMUSCULAR | Status: DC | PRN

## 2021-02-17 MED ORDER — POLYETHYLENE GLYCOL 3350 17 G PO PACK
17.0000 g | PACK | Freq: Every day | ORAL | Status: DC
  Administered 2021-02-18 – 2021-02-20 (×2): 17 g via ORAL
  Filled 2021-02-17 (×3): qty 1

## 2021-02-17 MED ORDER — DOCUSATE SODIUM 100 MG PO CAPS
100.0000 mg | ORAL_CAPSULE | Freq: Two times a day (BID) | ORAL | Status: DC
  Administered 2021-02-17 – 2021-02-20 (×6): 100 mg via ORAL
  Filled 2021-02-17 (×6): qty 1

## 2021-02-17 MED ORDER — FOLIC ACID 1 MG PO TABS
1.0000 mg | ORAL_TABLET | Freq: Every day | ORAL | Status: DC
  Administered 2021-02-17 – 2021-02-20 (×4): 1 mg via ORAL
  Filled 2021-02-17 (×4): qty 1

## 2021-02-17 MED ORDER — THIAMINE HCL 100 MG PO TABS
100.0000 mg | ORAL_TABLET | Freq: Every day | ORAL | Status: DC
  Administered 2021-02-17 – 2021-02-20 (×4): 100 mg via ORAL
  Filled 2021-02-17 (×4): qty 1

## 2021-02-17 NOTE — Progress Notes (Signed)
   02/17/21 1511  Clinical Encounter Type  Visited With Other (Comment) (Spoke with Nurse, Florentina Addison via phone)  Visit Type Follow-up  Referral From Nurse  Consult/Referral To Chaplain  Nurse, Florentina Addison called stating family is supposed to be here at 3:30. The patient has decided to have his sister and brother as his HCPOA. --- Nurse, Florentina Addison, called back and stated the patient is not alert now and she has advised the family to wait until tomorrow to have Advance Directive signed and notarized. This note was prepared by Deneen Harts, M.Div..  For questions please contact by phone [phone].

## 2021-02-17 NOTE — Progress Notes (Addendum)
STROKE TEAM PROGRESS NOTE   INTERVAL HISTORY        His sister was at the bedside.  BP low normal. Afebrile.   Extubated this morning.  Opened eyes to voice, alert, oriented to person, place, time.  Hypophonic, PERRL, EOMI, opened his mouth to command, FC x4, moves all extremities LUE 4-/5, LLE 4-/5, RUE 2/4, RLE 3/5.     Vitals:   02/17/21 0800 02/17/21 0900 02/17/21 1000 02/17/21 1146  BP: 91/61 112/89 100/60   Pulse: 78 93 94   Resp: (!) 21 11 (!) 9   Temp: 98 F (36.7 C)   98.9 F (37.2 C)  TempSrc: Axillary   Oral  SpO2: 100% 100% 100%   Weight:       CBC:  Recent Labs  Lab 02/15/21 1510 02/15/21 1638 02/16/21 0138 02/17/21 0903  WBC 10.8*  --  7.6 9.1  NEUTROABS 8.1*  --   --   --   HGB 16.3   < > 13.8 15.2  HCT 50.2   < > 40.1 45.3  MCV 102.2*  --  98.5 99.8  PLT 182  --  148* 150   < > = values in this interval not displayed.   Basic Metabolic Panel:  Recent Labs  Lab 02/16/21 0138 02/16/21 1713 02/17/21 0548 02/17/21 0903  NA 137  --   --  134*  K 3.7  --   --  3.9  CL 102  --   --  102  CO2 25  --   --  25  GLUCOSE 77  --   --  90  BUN 6  --   --  8  CREATININE 0.98  --   --  1.19  CALCIUM 8.2*  --   --  8.1*  MG 1.5* 1.5* 1.9  --   PHOS  --  2.8 3.5  --    Lipid Panel: Pending  HgbA1c: Pending Urine Drug Screen:  Recent Labs  Lab 02/15/21 1719  LABOPIA NONE DETECTED  COCAINSCRNUR NONE DETECTED  LABBENZ NONE DETECTED  AMPHETMU NONE DETECTED  THCU POSITIVE*  LABBARB NONE DETECTED    Alcohol Level  Recent Labs  Lab 02/15/21 1701  ETH <10    IMAGING past 24 hours CT HEAD WO CONTRAST Result Date: 02/15/2021 IMPRESSION: Stable areas of hemorrhage in the right temporal lobe and right cerebellum. No new areas of hemorrhage. Electronically   CT CHEST W CONTRAST Result Date: 02/15/2021 IMPRESSION:  1. Slightly improved but persistently extensive bilateral central pulmonary emboli that extend to all pulmonary lobes. Persistent but  improved secondary findings of right heart strain.  2. Interval increase in left lower lobe hypodense consolidation likely representing infection/inflammation.  3. Marked bilateral gynecomastia that appears to be asymmetric, left greater than right. Given asymmetry and patient age/gender, recommend correlation with mammography to exclude an underlying lesion.  4. Indeterminate 4 mm right upper lobe nodule.  5. No acute traumatic injury to the chest, abdomen, or pelvis.  6. No acute fracture or traumatic malalignment of the thoracic or lumbar spine.  7. Lines and tubes in appropriate position.   CT CERVICAL SPINE WO CONTRAST Result Date: 02/15/2021 IMPRESSION: No acute bony abnormality in the cervical spine. Cervical spondylosis. Right pleural effusion and right upper lobe airspace disease partially imaged peer  CT ABDOMEN PELVIS W CONTRAST Result Date: 02/15/2021  IMPRESSION:  1. Slightly improved but persistently extensive bilateral central pulmonary emboli that extend to all pulmonary lobes. Persistent but improved  secondary findings of right heart strain.  2. Interval increase in left lower lobe hypodense consolidation likely representing infection/inflammation.  3. Marked bilateral gynecomastia that appears to be asymmetric, left greater than right. Given asymmetry and patient age/gender, recommend correlation with mammography to exclude an underlying lesion.  4. Indeterminate 4 mm right upper lobe nodule.  5. No acute traumatic injury to the chest, abdomen, or pelvis.  6. No acute fracture or traumatic malalignment of the thoracic or lumbar spine.  7. Lines and tubes in appropriate position.   DG Chest Port 1 View Result Date: 02/16/2021 IMPRESSION: Patchy opacity of left lung base, pneumonia is not excluded. Previously noted bilateral upper lobe airspace opacities have resolved, minimal residual atelectasis is identified in the right upper lobe. Electronically Signed   By: Sherian Rein  M.D.   On: 02/16/2021 09:23   DG Chest Portable 1 View  Result Date: 02/15/2021 IMPRESSION: ET tube approximately 4.4 cm above the carina. Cardiomegaly, vascular congestion. New bilateral upper lobe airspace opacities.   Overnight EEG with video  Result Date: 02/16/2021 Impression: This was an abnormal continuous video EEG due to a slow, sedated background with focal slowing and LPDs in the right temporal region, 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 No seizures were seen.   CT HEAD CODE STROKE WO CONTRAST Result Date: 02/15/2021  IMPRESSION: Right lateral cerebellar intraparenchymal hemorrhage measuring 4 cc volume with mild surrounding edema. Right lateral temporal intraparenchymal hematoma measuring 10 cc volume with mild surrounding edema. 6 mm right temporal intraparenchymal hematoma adjacent to the temporal tip of the lateral ventricle. Mild mass effect with 1-2 cm of right-to-left shift. No skull fracture or extra-axial bleed. These abnormalities were not visible in any way on the study 3 days ago.    PHYSICAL EXAM General - Well nourished, well developed, in no apparent distress, intubated. Cardiovascular - Regular rate and rhythm.  Mental Status -  Opens eyes to voice, Oriented to person, place, and time.  Hypophonic voice.  Language including expression, naming, repetition, comprehension were not assessed and intact. Good attention span and concentration.  Cranial Nerves II - XII - II - Visual fields full III, IV, VI - Extraocular movements intact. V - Facial sensation intact bilaterally. VII - Facial movement symmetric. VIII - Hearing & vestibular intact.  Able to follow verbal commands XII - Tongue protrusion intact.  Motor Strength - The patient is able to move all 4 extremities.  LUE 4-/5, LLE 4-/5, RUE 2/4, RLE 3/5.  Able to follow commands with all 4s (thumbs up, 2 fingers and wiggles toes).  Bulk was normal.    Sensory -  intact  Coordination - FNF ataxia RUE  Gait and Station - deferred.    ASSESSMENT/PLAN [name] is a 50 y.o. male with history of PE/DVT on Eliquis, ETOH, PTSD, presenting following a syncopal event.  CT head revealed R temporal region (10cc) and R cerebellum (4cc).      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  Therapy recommendations:  pending  Disposition:  pending  Seizures  Clinical seizures (3/19): staring ahead, nonverbal and rhythmic shaking of his L arm and leg   Loaded with Keppra then Keppra 1500mg  bid  Versed infusion off  LTM EEG - slow, sedated background with focal slowing and LPDs in the right temporal region, 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 No seizures were seen.  Stat EEG 3/21 mild diffuse encephalopathy, 2) persistent slowing in the right temporal>central region, consistent with focal neuronal dysfunction and 3) suspected discharges intermittently/frequently seen in the right temporal region consistent with an epileptiform foci, although without clustering/always in isolation, and without seizure activity at any point.   Pulmonary Embolism  CT chest (3/19): Slightly improved but persistently extensive bilateral central pulmonary emboli that extend to all pulmonary lobes.   Eliquis prior to admission  Not a candidate for full anticoagulation at this time due to intraparenchymal hemorrhages  DVTs  Eliquis prior to admission  IVC  Filter placement (3/20)  Not a candidate for full anticoagulation at this time due to intraparenchymal hemorrhages  Acute Respiratory failure  Intubated in the ED after multiple seizure episodes  Extubated 3/21, stable on [place]  Hypotension  Improving now off of sedation  MAP goal >65, SBP goal <160  Hyperlipidemia  Home meds:  none  LDL 86, goal < 70  Consider starting statin at discharge   Heavy alcholic  Heavy drinker  Discharged home 3/19 and drank 6 beers at home before re-admission the same day  CIWA protocol  Alcohol withdraw precaution  B1/FA/MVI  Other Stroke Risk Factors  Substance abuse - UDS:  THC POSITIVE. Patient will be advised to stop using due to stroke risk.  Obesity, Body mass index is 32.1 kg/m., BMI >/= 30 associated with increased stroke risk, recommend weight loss, diet and exercise as appropriate   Coronary artery disease  Congestive heart failure  Other Active Problems  Incidental finding of a 37mm right upper lobe lung nodule on CT chest (02/15/21): follow-up with PCP  Anxiety/depression: takes Zoloft 150mg  daily at home, Trazodone for sleep  Hospital day # 2  Danny Lowe, ACNP-BC Stroke NP 02/17/2021  ATTENDING NOTE: I reviewed above note and agree with the assessment and plan. Pt was seen and examined.   Patient was extubated this morning, right after extubation, patient has episode of agitation, bilateral facial twitching and right arm shaking.  Patient did receive large dose of sedation before extubation, considering for post sedation reaction.  However, given patient had a seizure on Keppra, stat EEG done which ruled out active seizure.  Continue Keppra 1500 twice daily.    After extubation, patient able to gaze bilaterally on request, however not up to answer questions for place, time or age.  However follow commands of lifting forearms, squeezing hands, moving toes or bending knees.  Strength symmetrical, no focal  weakness.  However, patient does have recent history of right frozen shoulder, he had dramatic pain with right shoulder movement.  Currently, passed swallow on diet, BP still on the lower end, encourage p.o. intake.  May consider IV fluids if needed.  Continue to hold off anticoagulation due to ICH, IVC filter placed.  For detailed assessment and plan, please refer to above as I have made changes wherever appropriate.   , MD PhD Stroke Neurology 02/17/2021 7:29 PM  This patient is critically ill due to ICH in the setting of  Eliquis use, recent PE and DVT, respiratory failure requiring intubation, seizure on Keppra and at significant risk of neurological worsening, death form hematoma expansion, cerebral edema, status epilepticus, heart failure. This patient's care requires constant monitoring of vital signs, hemodynamics, respiratory and cardiac monitoring, review of multiple databases, neurological assessment, discussion with family, other specialists and medical decision making of high complexity. I spent 40 minutes of neurocritical care time in the care of this patient.  I discussed with Dr. Katrinka BlazingSmith CCM.   To contact Stroke Continuity provider, please refer to [email or web address]. After hours, contact General Neurology

## 2021-02-17 NOTE — Procedures (Signed)
Extubation Procedure Note  Patient Details:   Name: Danny Lowe DOB: 17-Jun-1971 MRN: [id number]   Airway Documentation:    Vent end date: 02/17/21 Vent end time: 0915   Evaluation  O2 sats: stable throughout Complications: No apparent complications Patient did tolerate procedure well. Bilateral Breath Sounds: Diminished,Rhonchi   Yes.   Pt extubated per physician order. Pt with cuff leak prior. Pt suctioned orally and via ETT prior. Post extubation pt placed on 4L nasal cannula with humidity, pt able to speak name give a fair cough and no stridor heard at this time. RT will continue to monitor and be available as needed.   Derinda Late 02/17/2021, 9:24 AM

## 2021-02-17 NOTE — Progress Notes (Signed)
Pt extubated @ 915 without complications.  Pt's sister, Zella Ball and mother, Zella Ball at bedside.  Pt oriented at this time.  Pt clearly stated that he did not want his estranged wife to visit or to receive medical information.

## 2021-02-17 NOTE — Procedures (Signed)
EEG Report Indication: possible seizure activity witnessed vs syncope--recent fall/contusion  This study was recorded in the waking/drowsy state.  The duration of the study was 23 minutes.  Electrodes were placed according to the International 10/20 system.  Video was reviewed for clinical correlation as needed.  In the waking state, some degree of background organization is seen with an attenuated but perceptible anterior - posterior voltage and frequency gradient.  In the occipital leads there is a symmetric and reactive posterior dominant rhythm of approximately 8-9 hertz, which is less distinct/defined compared to normal.  Anteriorly, is the expected pattern of faster frequency, lower voltage waveforms though in a mildly reduced amount as compared to normal, and particularly with increased delta slowing with overriding fast frequencies which is seen throughout.  There is a clear and persistent region of diminished fast frequencies in the right temporal>central region.  There are also sharp waves in the right temporal region, with a voltage maximum at lead T8.  These are seen frequently during the study, but always in isolation and without clear clustering.  There is no evident drowsy organization, although there are periods--likely corresponding to drowsiness--in which there is some slight drop out of the faster frequencies, attenuation and of the gradient and disappearance of the posterior dominant rhythm.  Hyperventilation: deferred Photic stimulation: unremarkable  Impression:  This is an abnormal waking and drowsy study due to 1) attenuated organization as described above--clinically most consistent with a mild diffuse encephalopathy, 2) persistent slowing in the right temporal>central region, consistent with focal neuronal dysfunction and 3) suspected discharges intermittently/frequently seen in the right temporal region consistent with an epileptiform foci, although without  clustering/always in isolation, and without seizure activity at any point.

## 2021-02-17 NOTE — TOC CAGE-AID Note (Signed)
Transition of Care [facility]) - CAGE-AID Screening   Patient Details  Name: Danny Lowe MRN: [id number] Date of Birth: March 22, 1971  Transition of Care [facility]) CM/SW Contact:    Mearl Latin, LCSW Phone Number: 02/17/2021, 10:24 AM   Clinical Narrative: Patient intubated and unable to participate in screening.    CAGE-AID Screening: Substance Abuse Screening unable to be completed due to: : Patient unable to participate

## 2021-02-17 NOTE — Progress Notes (Signed)
NAME:  Danny Lowe, MRN:  [id number], DOB:  [birth year], LOS: 2 ADMISSION DATE:  02/15/2021, CONSULTATION DATE:  02/15/2021 REFERRING MD:  [place] Kuglar, PA, CHIEF COMPLAINT:  ICH  History of Present Illness:  HPI obtained from medical chart review as patient is currently intubated and sedated on mechanical ventilation.   50 year old male with prior history of daily ETOH use, PTSD, depression and recent admit with PE/ DVT found down in [facility] parking lot after discharge.  He was apparently there to fill his Eliquis prescription.  He was admitted to [facility] service on 3/16 after he presented after syncopal event with initial negative CTH but noted to have right frontal/ occipital hematoma.  He was found to have unprovoked massive PE with right heart strain and extensive left DVT s/p half dose tPA.  Presented after syncopal event with initial negative CTH but noted to have right frontal/ occipital hematoma.  He was transitioned to Eliquis 3/18 and was hemodynamically stable and tolerated physical therapy without hypoxia or complications discharged home.    He arrived as a code stroke in ER by EMS confused with reported seizure activity and worsening mental status after being found down in parking lot, unknown events prior to.  He developed tonic-clonic seizure in CT.   CTH showed right lateral cerebellar and temporal IPH with right to left shift 1-2 mm.  He was intubated for airway protection.  Ativan given, loaded with Keppra, and Kcentra started.  Additionally hypotensive now improved after 2L NS bolus.  PCCM called for admission.   Pertinent  Medical History  Daily ETOH use, PTSD, depression, PE/ DVT (02/12/2021)  Significant Hospital Events: Including procedures, antibiotic start and stop dates in addition to other pertinent events   . 3/19 discharged for PE/ DVT found down 2hrs later in parking lot; code stroke-> found with ICH, witnessed seizure, and intubated  . 3/19 CT head shows right  frontal parietal circumscribed hematoma in the right cerebellar circumscribed hematoma.  Minimal to no adjacent edema. . 3/20 EEG monitoring showed right temporal focus of irritability but no seizures. IVC filter placed.  . 3/21 weaning trials  Interim History / Subjective:   Patient nearly self-extubated multiple times overnight per RN   Objective   Blood pressure (!) 87/65, pulse 77, temperature 98 F (36.7 C), temperature source Axillary, resp. rate 14, weight 98.6 kg, SpO2 100 %.    Vent Mode: PRVC FiO2 (%):  [30 %-50 %] 30 % Set Rate:  [22 bmp] 22 bmp Vt Set:  [560 mL] 560 mL PEEP:  [5 cmH20] 5 cmH20 Plateau Pressure:  [18 cmH20-22 cmH20] 19 cmH20   Intake/Output Summary (Last 24 hours) at 02/17/2021 0818 Last data filed at 02/17/2021 0600 Gross per 24 hour  Intake 1172.97 ml  Output 560 ml  Net 612.97 ml   Filed Weights   02/16/21 0400 02/17/21 0400  Weight: 98 kg 98.6 kg    Examination: General: Middle aged male on vent.  HEENT: R scalp hematoma. Lip lac.  Neuro: Sedated RASS -3.  CV: RRR, no mRG PULM:  Clear bilateral GI: Soft, normoactive Extremities: warm/dry, no edema  Skin: no rashes or lesions   Labs/imaging personally reviewed   3/21 MRI brain Three hematomas, 1 in the lateral right cerebellum and 2 in the right lateral temporal lobe with surrounding edema. Mild swelling but no mass effect or midline shift. No evidence of enlargement. No new intracranial hemorrhage.  Negative intracranial MR angiography of the large and medium size  vessels.   Resolved Hospital Problem list    Assessment & Plan:   Intraparenchymal hemorrhage: R lateral cerebellar and temporal lobe ICH in the setting of Eliquis for PE. Reversed in ED with [facility]. - Stroke service following - Holding AC - Keep SBp < 160 mmHg  Acute hypoxemic respiratory failure secondary to ICH and inability to protect airway.  - Attempt to wean today - Will plan to extubate - WUA - May need to try  precedex if agitation is prohibitive considering historical alcohol abuse.  - VAP bundle  Pulmonary embolism: admitted for this last week. Received half dose tpa and was discharged on eliquis. LE DVT  - D/C Eliquis - IVC filter placed 3/20  Seizure-like activity but negative EEG - keppra for sz ppx  Etoh - thiamine, folate   Best practice (evaluated daily)  Diet:  NPO Pain/Anxiety/Delirium protocol (if indicated): Yes (RASS goal -1) VAP protocol (if indicated): Yes DVT prophylaxis: SCDs GI prophylaxis: PPI Glucose control:  na Central venous access:  N/A Family update: mother and wife (separated) updated bedside.    Critical care time : 46 minutes   Joneen Roach, AGACNP-BC [facility]  See Amion for personal pager PCCM on call pager [phone] until 7pm. Please call Elink 7p-7a. [phone]  02/17/2021 8:33 AM

## 2021-02-17 NOTE — Progress Notes (Signed)
PCCM INTERVAL PROGRESS NOTE  Patient beginning to develop delirium and is attempting to get out of bed. Does not realize he is in the hospital. Concern for alcohol w/d and DTs. Will start CIWA protocol.    Joneen Roach, AGACNP-BC [facility]  See Amion for personal pager PCCM on call pager [phone] until 7pm. Please call Elink 7p-7a. [phone]  02/17/2021 4:58 PM

## 2021-02-17 NOTE — Progress Notes (Signed)
EEG completed, results pending. 

## 2021-02-17 NOTE — Progress Notes (Signed)
Pt has verbalized several times that he does not want his estranged wife, Patsy Lager to receive any medical information or to have visitation rights.  RN had set up time for the family and chaplain to be here to sign HCPOA paperwork.  However, pt became delirious around 1530 attempting to get out of bed and took 2 IVs out. Started on CIWA at this time and ativan given. RN advised family and chaplain of this and stated he is not competent to make any decisions such as these at this time.  Will further assess tomorrow but will pass along to not give information to [place]; only to pt's mother, Zella Ball, pt's sister Zella Ball, pt's brother, Patent attorney and pt's son, Justice.

## 2021-02-17 NOTE — Evaluation (Signed)
Clinical/Bedside Swallow Evaluation Patient Details  Name: Danny Lowe MRN: [id number] Date of Birth: 1971-03-28  Today's Date: 02/17/2021 Time: SLP Start Time (ACUTE ONLY): 1221 SLP Stop Time (ACUTE ONLY): 1241 SLP Time Calculation (min) (ACUTE ONLY): 20 min  Past Medical History:  Past Medical History:  Diagnosis Date  . Depression    Past Surgical History:  Past Surgical History:  Procedure Laterality Date  . IR IVC FILTER PLMT / S&I /IMG GUID/MOD SED  02/16/2021   HPI:  50 yr old admitted after being found down in parking lot, seizures in ED. Per chart, pt had massive PE last week, started Eliquis. MRI noted hematoma in the lateral right cerebellum and 2 hematomas in the right lateral temporal lobe with surrounding edema. Mild swelling but no mass effect or midline shift. No new intracranial hemorrhage, mild edema in the right parieto-occipital scalp consistent with head trauma. Intubated 3/19-3/21 (am).   Assessment / Plan / Recommendation Clinical Impression  Pt seen for swallow evaluation with family present after two day intubation. Per family, he is soft spoken at baseline and he was vocalization was audible with cues to increase intensity. Vocal quality clear and attempts to cough were throat clears and affirms odonophagia. Consumed 3 oz water without overt s/s aspiration (one throat clear). Puree and regular texture within normal limits. His hematoma increases risk however with adequate positiong recommend regular texture, thin liquids. No further ST needed for swallow. SLP Visit Diagnosis: Dysphagia, unspecified (R13.10)    Aspiration Risk  Mild aspiration risk    Diet Recommendation Regular;Thin liquid   Liquid Administration via: Cup;Straw Medication Administration: Whole meds with liquid Supervision: Patient able to self feed;Intermittent supervision to cue for compensatory strategies Compensations: Slow rate;Small sips/bites Postural Changes: Seated upright at  90 degrees    Other  Recommendations Oral Care Recommendations: Oral care BID   Follow up Recommendations None      Frequency and Duration            Prognosis        Swallow Study   General Date of Onset: 02/15/21 HPI: 50 yr old admitted after being found down in parking lot, seizures in ED. Per chart, pt had massive PE last week, started Eliquis. MRI noted hematoma in the lateral right cerebellum and 2 hematomas in the right lateral temporal lobe with surrounding edema. Mild swelling but no mass effect or midline shift. No new intracranial hemorrhage, mild edema in the right parieto-occipital scalp consistent with head trauma. Intubated 3/19-3/21 (am). Type of Study: Bedside Swallow Evaluation Previous Swallow Assessment: none Diet Prior to this Study: NPO Temperature Spikes Noted: Yes Respiratory Status: Nasal cannula History of Recent Intubation: Yes Length of Intubations (days): 2 days Date extubated: 02/17/21 (am) Behavior/Cognition: Alert;Cooperative;Pleasant mood;Requires cueing Oral Cavity Assessment: Within Functional Limits Oral Care Completed by SLP: No Oral Cavity - Dentition: Adequate natural dentition Vision: Functional for self-feeding Self-Feeding Abilities: Able to feed self;Needs assist Patient Positioning: Upright in bed Baseline Vocal Quality: Other (comment);Low vocal intensity (pt soft spoken at baseline) Volitional Cough: Weak (more of throat clear) Volitional Swallow: Able to elicit    Oral/Motor/Sensory Function Overall Oral Motor/Sensory Function: Within functional limits   Ice Chips Ice chips: Within functional limits Presentation: Spoon   Thin Liquid Thin Liquid: Within functional limits Presentation: Cup;Straw    Nectar Thick Nectar Thick Liquid: Not tested   Honey Thick Honey Thick Liquid: Not tested   Puree Puree: Within functional limits   Solid  Solid: Within functional limits      Royce Macadamia 02/17/2021,2:42 PM   Breck Coons Lonell Face.Ed Nurse, children's [phone] Office [phone]

## 2021-02-17 NOTE — Progress Notes (Signed)
   02/17/21 1036  Clinical Encounter Type  Visited With Patient and family together  Visit Type Initial  Referral From Nurse  Consult/Referral To Chaplain  Chaplain responded. Nurse, Florentina Addison called stated the patient was recently extubated. There are family issues. The estranged wife resurfaced, and the patient and family want to make sure she is not his healthcare power of attorney. The patient wants to appoint his 50 year old son. I provided education to the patient's mom and sister, Zella Ball.  Zella Ball stated she will make sure his son gets there and I will return to provide education and have notary present.  This note was prepared by Deneen Harts, M.Div..  For questions please contact by phone [phone].

## 2021-02-18 ENCOUNTER — Inpatient Hospital Stay (HOSPITAL_COMMUNITY): Payer: 59

## 2021-02-18 DIAGNOSIS — M79603 Pain in arm, unspecified: Secondary | ICD-10-CM | POA: Diagnosis not present

## 2021-02-18 DIAGNOSIS — I2609 Other pulmonary embolism with acute cor pulmonale: Secondary | ICD-10-CM | POA: Diagnosis not present

## 2021-02-18 DIAGNOSIS — I82621 Acute embolism and thrombosis of deep veins of right upper extremity: Secondary | ICD-10-CM

## 2021-02-18 DIAGNOSIS — I616 Nontraumatic intracerebral hemorrhage, multiple localized: Secondary | ICD-10-CM | POA: Diagnosis not present

## 2021-02-18 DIAGNOSIS — J9601 Acute respiratory failure with hypoxia: Secondary | ICD-10-CM | POA: Diagnosis not present

## 2021-02-18 DIAGNOSIS — I619 Nontraumatic intracerebral hemorrhage, unspecified: Secondary | ICD-10-CM | POA: Diagnosis not present

## 2021-02-18 DIAGNOSIS — J9602 Acute respiratory failure with hypercapnia: Secondary | ICD-10-CM | POA: Diagnosis not present

## 2021-02-18 DIAGNOSIS — I82433 Acute embolism and thrombosis of popliteal vein, bilateral: Secondary | ICD-10-CM

## 2021-02-18 LAB — CBC
HCT: 42 % (ref 39.0–52.0)
Hemoglobin: 14 g/dL (ref 13.0–17.0)
MCH: 33 pg (ref 26.0–34.0)
MCHC: 33.3 g/dL (ref 30.0–36.0)
MCV: 99.1 fL (ref 80.0–100.0)
Platelets: 164 10*3/uL (ref 150–400)
RBC: 4.24 MIL/uL (ref 4.22–5.81)
RDW: 14.2 % (ref 11.5–15.5)
WBC: 6.1 10*3/uL (ref 4.0–10.5)
nRBC: 0 % (ref 0.0–0.2)

## 2021-02-18 LAB — BASIC METABOLIC PANEL
Anion gap: 7 (ref 5–15)
BUN: 9 mg/dL (ref 6–20)
CO2: 24 mmol/L (ref 22–32)
Calcium: 8.1 mg/dL — ABNORMAL LOW (ref 8.9–10.3)
Chloride: 105 mmol/L (ref 98–111)
Creatinine, Ser: 1.15 mg/dL (ref 0.61–1.24)
GFR, Estimated: >60 mL/min (ref >60)
Glucose, Bld: 87 mg/dL (ref 70–99)
Potassium: 3.4 mmol/L — ABNORMAL LOW (ref 3.5–5.1)
Sodium: 136 mmol/L (ref 135–145)

## 2021-02-18 LAB — ANTITHROMBIN III: AntiThromb III Func: 79 % (ref 75–120)

## 2021-02-18 LAB — GLUCOSE, CAPILLARY
Glucose-Capillary: 80 mg/dL (ref 70–99)
Glucose-Capillary: 80 mg/dL (ref 70–99)
Glucose-Capillary: 84 mg/dL (ref 70–99)
Glucose-Capillary: 93 mg/dL (ref 70–99)

## 2021-02-18 LAB — VITAMIN B12: Vitamin B-12: 221 pg/mL (ref 180–914)

## 2021-02-18 LAB — C-REACTIVE PROTEIN: CRP: 3.9 mg/dL — ABNORMAL HIGH (ref <1.0)

## 2021-02-18 LAB — SEDIMENTATION RATE: Sed Rate: 25 mm/hr — ABNORMAL HIGH (ref 0–16)

## 2021-02-18 MED ORDER — BUSPIRONE HCL 10 MG PO TABS
10.0000 mg | ORAL_TABLET | Freq: Every day | ORAL | Status: DC
  Administered 2021-02-18 – 2021-02-20 (×3): 10 mg via ORAL
  Filled 2021-02-18 (×3): qty 1

## 2021-02-18 MED ORDER — TRAZODONE HCL 100 MG PO TABS: 100.0000 mg | ORAL_TABLET | Freq: Every evening | ORAL | Status: DC | PRN

## 2021-02-18 MED ORDER — POTASSIUM CHLORIDE CRYS ER 20 MEQ PO TBCR
40.0000 meq | EXTENDED_RELEASE_TABLET | Freq: Once | ORAL | Status: AC
  Administered 2021-02-18: 40 meq via ORAL
  Filled 2021-02-18: qty 2

## 2021-02-18 NOTE — Evaluation (Signed)
Occupational Therapy Evaluation Patient Details Name: Danny Lowe MRN: [id number] DOB: 12-05-1970 Today's Date: 02/18/2021    History of Present Illness 50 yo admitted 3/16 with syncope at home hitting face without fx and additional 2 syncopal events with EMS. Pt with massive bil PE and LLE DVT s/p tPA and heparin. PMhx: PTSD, depression ETOH abuse.  Pt readmitted 3/19 after found down in [facility] parking lot just after d/c.  Pt with R frontal/occipital hematoma and R lateral cerebellar and temporal IPH with shift.  Pt with szs on arrival.   Clinical Impression   Pt admitted with the above diagnosis and has the deficits outlined below. Pt would benefit from cont OT to increase independence with basic adls and balance in standing during adls before pt returns home with his mother.  Do not feel pt is safe to return home alone at this point as pt continues to have some mild balance deficits requiring min assist at times.  Feel OPOT would be appropriate at this time but will reassess close to d/c for this need.      Follow Up Recommendations  Outpatient OT;Supervision/Assistance - 24 hour    Equipment Recommendations  None recommended by OT    Recommendations for Other Services       Precautions / Restrictions Precautions Precautions: Other (comment);Fall (seizure precautions) Precaution Comments: seizure precautions Restrictions Weight Bearing Restrictions:  (limited weight bearing with RUE due to swelling and pending doppler)      Mobility Bed Mobility Overal bed mobility: Needs Assistance Bed Mobility: Supine to Sit     Supine to sit: Supervision;HOB elevated     General bed mobility comments: Pt required no physical assist. HOB at 40 degrees    Transfers Overall transfer level: Needs assistance Equipment used: 1 person hand held assist Transfers: Sit to/from [employer] Sit to Stand: Min guard Stand pivot transfers: Min guard       General  transfer comment: Min guard for safety as pts first time up.    Balance Overall balance assessment: Mild deficits observed, not formally tested                                         ADL either performed or assessed with clinical judgement   ADL Overall ADL's : Needs assistance/impaired Eating/Feeding: Set up;Sitting   Grooming: Set up;Sitting   Upper Body Bathing: Set up;Sitting   Lower Body Bathing: Minimal assistance;Sit to/from stand;Cueing for compensatory techniques   Upper Body Dressing : Set up;Sitting   Lower Body Dressing: Minimal assistance;Sit to/from stand   Toilet Transfer: Minimal assistance;Ambulation;Comfort height toilet;Grab bars   Toileting- Clothing Manipulation and Hygiene: Minimal assistance;Sit to/from stand;Cueing for compensatory techniques       Functional mobility during ADLs: Minimal assistance General ADL Comments: Pt doing well with basic adls. Has tendencey to lean posteriorly when first standing requiring min assist when first getting up. This improved as session went on.     Vision Baseline Vision/History: No visual deficits Patient Visual Report: No change from baseline Vision Assessment?: No apparent visual deficits     Perception Perception Perception Tested?: No   Praxis Praxis Praxis tested?: Not tested    Pertinent Vitals/Pain Pain Assessment: No/denies pain     Hand Dominance Left   Extremity/Trunk Assessment Upper Extremity Assessment Upper Extremity Assessment: RUE deficits/detail RUE Deficits / Details: Pt with sore R shoulder from initial  fall but much better this am with full ROM and functional strength. RUE Sensation: decreased light touch (possibly due to swelling) RUE Coordination: WNL       Cervical / Trunk Assessment Cervical / Trunk Assessment: Normal   Communication Communication Communication: No difficulties   Cognition Arousal/Alertness: Awake/alert Behavior During Therapy: WFL  for tasks assessed/performed Overall Cognitive Status: Within Functional Limits for tasks assessed                                 General Comments: Pt mildly slow to answer questions but answered all accurately.  Recommend OPOT to further evaluate cognition if unable to do in next session acutely.   General Comments  Pt with swelling in R hand and RUE.  Spoke with MD due to pts h/o PE    Exercises     Shoulder Instructions      Home Living Family/patient expects to be discharged to:: Private residence Living Arrangements: Children;Parent Available Help at Discharge: Family;Available 24 hours/day Type of Home: House Home Access: Level entry     Home Layout: One level     Bathroom Shower/Tub: Producer, television/film/video: Handicapped height     Home Equipment: None   Additional Comments: Pt lives in own apartment but will d/c home with mother for a few days after d/c.      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        OT Problem List: Impaired balance (sitting and/or standing);Increased edema      OT Treatment/Interventions: Self-care/ADL training;DME and/or AE instruction;Therapeutic activities;Balance training    OT Goals(Current goals can be found in the care plan section) Acute Rehab OT Goals Patient Stated Goal: to get out of here OT Goal Formulation: With patient Time For Goal Achievement: 03/04/21 Potential to Achieve Goals: Good ADL Goals Pt Will Perform Grooming: with supervision;standing Pt Will Perform Lower Body Dressing: with modified independence;sit to/from stand Pt Will Perform Tub/Shower Transfer: Shower transfer;with supervision;ambulating Additional ADL Goal #1: Pt will walk to bathroom and complete all toileting tasks with mod I Additional ADL Goal #2: If necesssary, threapist to complete cognitive screen with pt to evaluate higher level  cognition.  OT Frequency: Min 3X/week   Barriers to D/C:    supportive family       Co-evaluation PT/OT/SLP Co-Evaluation/Treatment: Yes Reason for Co-Treatment: Necessary to address cognition/behavior during functional activity PT goals addressed during session: Mobility/safety with mobility OT goals addressed during session: ADL's and self-care      AM-PAC OT "6 Clicks" Daily Activity     Outcome Measure Help from another person eating meals?: None Help from another person taking care of personal grooming?: None Help from another person toileting, which includes using toliet, bedpan, or urinal?: A Little Help from another person bathing (including washing, rinsing, drying)?: A Little Help from another person to put on and taking off regular upper body clothing?: None Help from another person to put on and taking off regular lower body clothing?: A Little 6 Click Score: 21   End of Session Equipment Utilized During Treatment: Gait belt Nurse Communication: Mobility status  Activity Tolerance: Patient tolerated treatment well Patient left: in chair;with call bell/phone within reach;with chair alarm set;with family/visitor present  OT Visit Diagnosis: Unsteadiness on feet (R26.81);Other symptoms and signs involving the nervous system (R29.898)  Time: 0347-4259 OT Time Calculation (min): 39 min Charges:  OT General Charges $OT Visit: 1 Visit OT Evaluation $OT Eval Moderate Complexity: 1 Mod OT Treatments $Self Care/Home Management : 8-22 mins  Hope Budds 02/18/2021, 10:15 AM

## 2021-02-18 NOTE — Progress Notes (Signed)
NAME:  Danny Lowe, MRN:  [id number], DOB:  1971-05-06, LOS: 3 ADMISSION DATE:  02/15/2021, CONSULTATION DATE:  02/15/2021 REFERRING MD:  [place] Kuglar, PA, CHIEF COMPLAINT:  ICH  History of Present Illness:  HPI obtained from medical chart review as patient is currently intubated and sedated on mechanical ventilation.   50 year old male with prior history of daily ETOH use, PTSD, depression and recent admit with PE/ DVT found down in [facility] parking lot after discharge.  He was apparently there to fill his Eliquis prescription.  He was admitted to [facility] service on 3/16 after he presented after syncopal event with initial negative CTH but noted to have right frontal/ occipital hematoma.  He was found to have unprovoked massive PE with right heart strain and extensive left DVT s/p half dose tPA.  Presented after syncopal event with initial negative CTH but noted to have right frontal/ occipital hematoma.  He was transitioned to Eliquis 3/18 and was hemodynamically stable and tolerated physical therapy without hypoxia or complications discharged home.    He arrived as a code stroke in ER by EMS confused with reported seizure activity and worsening mental status after being found down in parking lot, unknown events prior to.  He developed tonic-clonic seizure in CT.   CTH showed right lateral cerebellar and temporal IPH with right to left shift 1-2 mm.  He was intubated for airway protection.  Ativan given, loaded with Keppra, and Kcentra started.  Additionally hypotensive now improved after 2L NS bolus.  PCCM called for admission.   Pertinent  Medical History  Daily ETOH use, PTSD, depression, PE/ DVT (02/12/2021)  Significant Hospital Events: Including procedures, antibiotic start and stop dates in addition to other pertinent events   . 3/19 discharged for PE/ DVT found down 2hrs later in parking lot; code stroke-> found with ICH, witnessed seizure, and intubated  . 3/19 CT head shows right  frontal parietal circumscribed hematoma in the right cerebellar circumscribed hematoma.  Minimal to no adjacent edema. . 3/20 EEG monitoring showed right temporal focus of irritability but no seizures. IVC filter placed.  . 3/21 extubated. Some signs of ETOH withdrawal treated effectively with low dose ativan CIWA  Interim History / Subjective:   Extubated yesterday morning and has tolerated well. No complaints this morning.   Objective   Blood pressure 126/79, pulse 86, temperature 98.8 F (37.1 C), temperature source Oral, resp. rate 19, weight 98.6 kg, SpO2 99 %.        Intake/Output Summary (Last 24 hours) at 02/18/2021 0914 Last data filed at 02/18/2021 0700 Gross per 24 hour  Intake 401.42 ml  Output 1450 ml  Net -1048.58 ml   Filed Weights   02/16/21 0400 02/17/21 0400  Weight: 98 kg 98.6 kg    Examination: General: Middle aged male resting comfortably in be.  HEENT: [place]/AT, PERRL, no JVD Neuro: Alert, oriented, non-focal.  CV: RRR, no mRG PULM:  Clear bilateral breath sounds GI: Soft, non-tender, normoactive  Extremities: no acute deformity or ROM limitation.  Skin: Grossly intact.   Labs/imaging personally reviewed   3/21 MRI brain Three hematomas, 1 in the lateral right cerebellum and 2 in the right lateral temporal lobe with surrounding edema. Mild swelling but no mass effect or midline shift. No evidence of enlargement. No new intracranial hemorrhage.  Negative intracranial MR angiography of the large and medium size vessels.   Resolved Hospital Problem list   Acute hypoxemic respiratory failure secondary to ICH and inability to  protect airway.   Assessment & Plan:   Intraparenchymal hemorrhage: R lateral cerebellar and temporal lobe ICH in the setting of Eliquis for PE. Reversed in ED with [facility]. - Stroke service following - Holding AC - Keep SBP < 160 mmHg  Seizure: Clinical sz observed 3/19 on presentation EEG showed epileptogenic potential, but no  seizure.  - Keppra  Pulmonary embolism: admitted for this last week. Received half dose tpa and was discharged on eliquis. LE DVT  - D/C Eliquis - IVC filter placed 3/20 - Not a candidate for [facility] due to IPH  Seizure-like activity but negative EEG - keppra for sz ppx  Etoh: some evidence of withdrawal on 3/21 improved with low dose ativan ciwa. Apparently drank 6 beers in between his previous discharge and admit on 3/19 - thiamine, folate - CIWA protocol  RUE pain: - RUE doppler  RUL nodule - outpatient follow up  Transfer patient to medical floor. TRH to assume care for 3/23   Best practice (evaluated daily)  Diet:   Pain/Anxiety/Delirium protocol (if indicated):NO VAP protocol (if indicated): Yes DVT prophylaxis: SCDs GI prophylaxis: PPI Glucose control:  na Central venous access:  N/A Family update: mother and wife (separated) updated bedside.     Joneen Roach, AGACNP-BC  Pulmonary & Critical Care  See Amion for personal pager PCCM on call pager [phone] until 7pm. Please call Elink 7p-7a. [phone]  02/18/2021 9:14 AM

## 2021-02-18 NOTE — Progress Notes (Signed)
Upper extremity venous has been completed.   Preliminary results in CV Proc.  Results given to Julianne Rice Kortlynn Poust 02/18/2021 4:08 PM

## 2021-02-18 NOTE — Progress Notes (Addendum)
STROKE TEAM PROGRESS NOTE   INTERVAL HISTORY        His sister was at the bedside.  Sitting in chair, Afebrile. VSS Opened eyes to voice, alert, oriented to person, place, time.  PERRL, EOMI, opened his mouth to command, FC x4, moves all extremities LUE 4/5, LLE 4/5, RUE 4-/4, RLE 4/5.     Vitals:   02/18/21 0800 02/18/21 0900 02/18/21 1000 02/18/21 1100  BP: 123/86 128/81 114/79 107/77  Pulse: 81 96 95 80  Resp: 14 (!) 21 (!) 22 18  Temp: 98.8 F (37.1 C)   98.7 F (37.1 C)  TempSrc: Oral   Oral  SpO2: 100% 100% 100% 100%  Weight:       CBC:  Recent Labs  Lab 02/15/21 1510 02/15/21 1638 02/17/21 0903 02/18/21 0105  WBC 10.8*   < > 9.1 6.1  NEUTROABS 8.1*  --   --   --   HGB 16.3   < > 15.2 14.0  HCT 50.2   < > 45.3 42.0  MCV 102.2*   < > 99.8 99.1  PLT 182   < > 150 164   < > = values in this interval not displayed.   Basic Metabolic Panel:  Recent Labs  Lab 02/16/21 1713 02/17/21 0548 02/17/21 0903 02/18/21 0105  NA  --   --  134* 136  K  --   --  3.9 3.4*  CL  --   --  102 105  CO2  --   --  25 24  GLUCOSE  --   --  90 87  BUN  --   --  8 9  CREATININE  --   --  1.19 1.15  CALCIUM  --   --  8.1* 8.1*  MG 1.5* 1.9  --   --   PHOS 2.8 3.5  --   --    Lipid Panel: Pending  HgbA1c: Pending Urine Drug Screen:  Recent Labs  Lab 02/15/21 1719  LABOPIA NONE DETECTED  COCAINSCRNUR NONE DETECTED  LABBENZ NONE DETECTED  AMPHETMU NONE DETECTED  THCU POSITIVE*  LABBARB NONE DETECTED    Alcohol Level  Recent Labs  Lab 02/15/21 1701  ETH <10    IMAGING past 24 hours CT HEAD WO CONTRAST Result Date: 02/15/2021 IMPRESSION: Stable areas of hemorrhage in the right temporal lobe and right cerebellum. No new areas of hemorrhage. Electronically   CT CHEST W CONTRAST Result Date: 02/15/2021 IMPRESSION:  1. Slightly improved but persistently extensive bilateral central pulmonary emboli that extend to all pulmonary lobes. Persistent but improved secondary  findings of right heart strain.  2. Interval increase in left lower lobe hypodense consolidation likely representing infection/inflammation.  3. Marked bilateral gynecomastia that appears to be asymmetric, left greater than right. Given asymmetry and patient age/gender, recommend correlation with mammography to exclude an underlying lesion.  4. Indeterminate 4 mm right upper lobe nodule.  5. No acute traumatic injury to the chest, abdomen, or pelvis.  6. No acute fracture or traumatic malalignment of the thoracic or lumbar spine.  7. Lines and tubes in appropriate position.   CT CERVICAL SPINE WO CONTRAST Result Date: 02/15/2021 IMPRESSION: No acute bony abnormality in the cervical spine. Cervical spondylosis. Right pleural effusion and right upper lobe airspace disease partially imaged peer  CT ABDOMEN PELVIS W CONTRAST Result Date: 02/15/2021  IMPRESSION:  1. Slightly improved but persistently extensive bilateral central pulmonary emboli that extend to all pulmonary lobes. Persistent but improved secondary  findings of right heart strain.  2. Interval increase in left lower lobe hypodense consolidation likely representing infection/inflammation.  3. Marked bilateral gynecomastia that appears to be asymmetric, left greater than right. Given asymmetry and patient age/gender, recommend correlation with mammography to exclude an underlying lesion.  4. Indeterminate 4 mm right upper lobe nodule.  5. No acute traumatic injury to the chest, abdomen, or pelvis.  6. No acute fracture or traumatic malalignment of the thoracic or lumbar spine.  7. Lines and tubes in appropriate position.   DG Chest Port 1 View Result Date: 02/16/2021 IMPRESSION: Patchy opacity of left lung base, pneumonia is not excluded. Previously noted bilateral upper lobe airspace opacities have resolved, minimal residual atelectasis is identified in the right upper lobe. Electronically Signed   By: Sherian Rein M.D.   On:  02/16/2021 09:23   DG Chest Portable 1 View  Result Date: 02/15/2021 IMPRESSION: ET tube approximately 4.4 cm above the carina. Cardiomegaly, vascular congestion. New bilateral upper lobe airspace opacities.   Overnight EEG with video  Result Date: 02/16/2021 Impression: This was an abnormal continuous video EEG due to a slow, sedated background with focal slowing and LPDs in the right temporal region, 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 No seizures were seen.   CT HEAD CODE STROKE WO CONTRAST Result Date: 02/15/2021  IMPRESSION: Right lateral cerebellar intraparenchymal hemorrhage measuring 4 cc volume with mild surrounding edema. Right lateral temporal intraparenchymal hematoma measuring 10 cc volume with mild surrounding edema. 6 mm right temporal intraparenchymal hematoma adjacent to the temporal tip of the lateral ventricle. Mild mass effect with 1-2 cm of right-to-left shift. No skull fracture or extra-axial bleed. These abnormalities were not visible in any way on the study 3 days ago.    PHYSICAL EXAM General - Well nourished, well developed, in no apparent distress,  HEENT: [place]/AT, PERRL, no JVD CV: RRR, no mRG PULM:  Clear bilateral breath sounds GI: Soft, non-tender, normoactive  Skin: Grossly intact.   Mental Status -  Opens eyes to voice, Oriented to person, place, and time.   Language including expression, naming, repetition, comprehension were intact. Good attention span and concentration.  Cranial Nerves II - XII - II - Visual fields full III, IV, VI - Extraocular movements intact. V - Facial sensation intact bilaterally. VII - Facial movement symmetric. VIII - Hearing & vestibular intact.  Able to follow verbal commands XII - Tongue protrusion intact.  Motor Strength - The patient is able to move all 4 extremities.  LUE 4/5, LLE 4/5, RUE 4-/4, RLE 4/5.  Able to follow commands with all 4s (thumbs up, 2 fingers and  wiggles toes).  Bulk was normal.    Sensory - intact Coordination - FNF ataxia RUE Gait and Station - deferred.    ASSESSMENT/PLAN [name] is a 50 y.o. male with history of PE/DVT on Eliquis, ETOH, PTSD, presenting following a syncopal event.  CT head revealed R temporal region (10cc) and R cerebellum (4cc).      ICH - R lateral cerebellar and temporal lobe ICH in the setting of Eliquis use, reversed by Kcentra  CT head: right lateral cerebellar IPH, right lateral temporal IPH x 2  Repeat CT head: Stable ICH   MRI stable ICH x 3  MRA unremarkable  2D Echo (02/13/21) 60-65%, severe dilatation and hypocontractility of the mid-basal RV, consistent with RV strain from PE  LDL 86  HgbA1c 4.8  VTE prophylaxis - SCDx  Diet: NPO  Eliquis (apixaban) daily prior to admission, now on No antithrombotic due to ICH.  Therapy recommendations:  Outpatient PT (neuro clinic), outpatient OT  Disposition:  pending  Seizures  Clinical seizures (3/19): staring ahead, nonverbal and rhythmic shaking of his L arm and leg   Loaded with Keppra then Keppra 1500mg  bid  LTM EEG - slow, sedated background with focal slowing and LPDs in the right temporal region, 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 No seizures were seen.  Stat EEG 3/21 mild diffuse encephalopathy, 2) persistent slowing in the right temporal>central region, consistent with focal neuronal dysfunction and 3) suspected discharges intermittently/frequently seen in the right temporal region consistent with an epileptiform foci, although without clustering/always in isolation, and without seizure activity at any point.   Pulmonary Embolism  CT chest (3/19): Slightly improved but persistently extensive bilateral central pulmonary emboli that extend to all pulmonary lobes.   Eliquis prior to admission  Not a candidate for full anticoagulation at this time due to intracerebral  hemorrhages  Hypercoagulable work up pending  Recommend to resume anticoagulation in 4 weeks after ICH resolves  DVTs  Eliquis prior to admission  IVC Filter placement (3/20)  Not a candidate for full anticoagulation at this time due to intracerebral hemorrhages  Right Upper Extremity arm pain/frozen shoulder -> RUE Doppler -> age indeterminate deep vein thrombosis involving the right radial veins and right ulnar veins.   Recommend to resume anticoagulation in 4 weeks after ICH resolves  Acute Respiratory failure  Intubated in the ED after multiple seizure episodes  Extubated 3/21, stable on [place]  Hypotension  Improving now off of sedation  MAP goal >65, SBP goal <160  Consider fluid bolus if BP drops  Hyperlipidemia  Home meds:  none  LDL 86, goal < 70  Consider starting statin at discharge   Heavy alcholic  Heavy drinker  Discharged home 3/19 and drank 6 beers at home before re-admission the same day  evidence of withdrawal on 3/21 improved with low dose ativan  CIWA protocol  Alcohol withdraw precaution  B1/FA/MVI  Anxiety/Depression  Resume home buspar 10mg  daily  Resume home trazodone 100mg  for sleep  Hypokalemia  Potassium level 3.9->3.4   Supplement potassium chloride 4/21 x1  Other Stroke Risk Factors  Substance abuse - UDS:  THC POSITIVE. Patient will be advised to stop using due to stroke risk.  Obesity, Body mass index is 32.1 kg/m., BMI >/= 30 associated with increased stroke risk, recommend weight loss, diet and exercise as appropriate   Coronary artery disease  Congestive heart failure  Other Active Problems  Incidental finding of a 56mm right upper lobe lung nodule on CT chest (02/15/21): follow-up with PCP  Hospital day # 3  Lissy Olivencia-Simmons, ACNP-BC Stroke NP 02/18/2021  ATTENDING NOTE: I reviewed above note and agree with the assessment and plan. Pt was seen and examined.   Patient grilfriend at bedside.   Patient sitting in chair, doing well, no acute event overnight, awake alert orientated x3, no aphasia follows simple commands.  Able to name and repeat.  Still has impaired concentration and attention, spelled world backwards wrong.  Otherwise neurologically intact.  Right upper extremity slight pain at right shoulder but much improved from yesterday.  However patient found to have right upper extremity age-indeterminate DVT.  Given patient high clot burden with PE and multiple DVTs, recommend to resume anticoagulation 4 weeks when ICH resolves.  We will also do hypercoagulable  work-up.  Will follow.  For detailed assessment and plan, please refer to above as I have made changes wherever appropriate.   Marvel PlanJindong Xu, MD PhD Stroke Neurology 02/18/2021 7:33 PM  I spent  35 minutes in total face-to-face time with the patient, more than 50% of which was spent in counseling and coordination of care, reviewing test results, images and medication, and discussing the diagnosis, treatment plan and potential prognosis. This patient's care requiresreview of multiple databases, neurological assessment, discussion with family, other specialists and medical decision making of high complexity. I had long discussion with patient and girlfriend at bedside, updated pt current condition, treatment plan and potential prognosis, and answered all the questions.  They expressed understanding and appreciation.      To contact Stroke Continuity provider, please refer to [email or web address]. After hours, contact General Neurology

## 2021-02-18 NOTE — Progress Notes (Signed)
eLink Physician-Brief Progress Note Patient Name: Danny Lowe DOB: 02-14-1971 MRN: [id number]   Date of Service  02/18/2021  HPI/Events of Note  Notified by bedside RN of [place] finding of UE DVT (result not in Epic at this time).  eICU Interventions  Patient came in with ICH in the background of apixaban use which was reversed. Also with bilateral PE with evidence of RH strain but decision made that risk of anticoagulation outweighs benefit at this time. IVC filter placed.  Patient will still need to be on anticoagulation but unable to start tonight due to risk of intracranial bleed.     Intervention Category Major Interventions: Other:  Darl Pikes 02/18/2021, 7:58 PM

## 2021-02-18 NOTE — Evaluation (Signed)
Physical Therapy Evaluation Patient Details Name: Danny Lowe MRN: [id number] DOB: 1971/05/25 Today's Date: 02/18/2021   History of Present Illness  50 yo admitted 3/16 with syncope at home hitting face without fx and additional 2 syncopal events with EMS. Pt with massive bil PE and LLE DVT s/p tPA and heparin. PMhx: PTSD, depression ETOH abuse.  Pt readmitted 3/19 after found down in [facility] parking lot just after d/c.  Pt with R frontal/occipital hematoma and R lateral cerebellar and temporal IPH with shift.  Pt with szs on arrival.  Clinical Impression   Pt admitted with the above diagnosis and has the deficits outlined below. Pt plans to return home with his mother initially.  Do not feel pt is safe to return home alone at this point as pt continues to have some mild balance deficits requiring min assist at times. Pt with slow processing and reactional times that place him at risk for falls. Pt capable of ambulating on even surfaces without UE support, but needing min guard-A for stability, especially with head turns. Will continue to follow acutely. Recommending outpatient PT at d/c at this time to maximize his safety and independence with all functional mobility. Will continue to assess need for follow-up PT during hospital stay.     Follow Up Recommendations Outpatient PT (neuro clinic)    Equipment Recommendations  None recommended by PT    Recommendations for Other Services       Precautions / Restrictions Precautions Precautions: Other (comment);Fall (seizure precautions) Precaution Comments: seizure precautions Restrictions Weight Bearing Restrictions:  (limited weight bearing with RUE due to swelling and pending doppler)      Mobility  Bed Mobility Overal bed mobility: Needs Assistance Bed Mobility: Supine to Sit     Supine to sit: Supervision;HOB elevated     General bed mobility comments: Pt required no physical assist. HOB at 40 degrees     Transfers Overall transfer level: Needs assistance Equipment used: 1 person hand held assist Transfers: Sit to/from [employer] Sit to Stand: Min guard Stand pivot transfers: Min guard       General transfer comment: Min guard for safety as pts first time up.  Ambulation/Gait Ambulation/Gait assistance: Min guard;Min assist Gait Distance (Feet): 200 Feet Assistive device: 1 person hand held assist;None Gait Pattern/deviations: Step-through pattern;Decreased stride length;Decreased step length - right;Decreased step length - left Gait velocity: reduced Gait velocity interpretation: <1.8 ft/sec, indicate of risk for recurrent falls General Gait Details: Pt ambulating slowly with trunk sway and L HHA initially as first time up. As distance progressed so did pt's speed and stability, allowing for pt to go to no AD/AE/HHA. However, intermittent extra steps or minor trunk sway laterally, min guard-A for safety. When pt turned head to L in standing statically he had posterior lean/LOB, minA to recover.  Stairs            Wheelchair Mobility    Modified Rankin (Stroke Patients Only) Modified Rankin (Stroke Patients Only) Pre-Morbid Rankin Score: No symptoms Modified Rankin: Moderately severe disability     Balance Overall balance assessment: Mild deficits observed, not formally tested                                           Pertinent Vitals/Pain Pain Assessment: No/denies pain    Home Living Family/patient expects to be discharged to:: Private residence Living Arrangements: Children;Parent  Available Help at Discharge: Family;Available 24 hours/day Type of Home: House Home Access: Level entry     Home Layout: One level Home Equipment: None Additional Comments: Pt lives in own apartment but will d/c home with mother for a few days after d/c.    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 RUE Sensation: decreased light touch (possibly due to swelling) RUE Coordination: WNL    Lower Extremity Assessment Lower Extremity Assessment: Overall WFL for tasks assessed (delayed contractions with R leg at times; MMT scores of 4+ to 5 bil; denies numbness/tingling; no dymetria or dysdiadochokinesia noted)    Cervical / Trunk Assessment Cervical / Trunk Assessment: Normal  Communication   Communication: No difficulties  Cognition Arousal/Alertness: Awake/alert Behavior During Therapy: WFL for tasks assessed/performed Overall Cognitive Status: Within Functional Limits for tasks assessed                                 General Comments: Pt mildly slow to answer questions but answered all accurately. Delayed processing at times      General Comments General comments (skin integrity, edema, etc.): Pt with swelling in R hand and RUE.  Spoke with MD due to pts h/o PE    Exercises     Assessment/Plan    PT Assessment Patient needs continued PT services  PT Problem List Decreased activity tolerance;Decreased balance;Decreased mobility;Decreased coordination;Decreased cognition;Decreased safety awareness       PT Treatment Interventions DME instruction;Gait training;Stair training;Functional mobility training;Therapeutic activities;Therapeutic exercise;Balance training;Neuromuscular re-education;Cognitive remediation;Patient/family education    PT Goals (Current goals can be found in the Care Plan section)  Acute Rehab PT Goals Patient Stated Goal: to get out of here PT Goal Formulation: With patient/family Time For Goal Achievement: 03/04/21 Potential to  Achieve Goals: Good    Frequency Min 4X/week   Barriers to discharge        Co-evaluation PT/OT/SLP Co-Evaluation/Treatment: Yes Reason for Co-Treatment: Necessary to address cognition/behavior during functional activity;For patient/therapist safety;To address functional/ADL transfers PT goals addressed during session: Mobility/safety with mobility;Balance OT goals addressed during session: ADL's and self-care       AM-PAC PT "6 Clicks" Mobility  Outcome Measure Help needed turning from your back to your side while in a flat bed without using bedrails?: A Little Help needed moving from lying on your back to sitting on the side of a flat bed without using bedrails?: A Little Help needed moving to and from a bed to a chair (including a wheelchair)?: A Little Help needed standing up from a chair using your arms (e.g., wheelchair or bedside chair)?: A Little Help needed to walk in hospital room?: A Little Help needed climbing 3-5 steps with a railing? : A Little 6 Click Score: 18    End of Session Equipment Utilized During Treatment: Gait belt Activity Tolerance: Patient tolerated treatment well Patient left: in chair;with call bell/phone within reach;with chair alarm set;with family/visitor present Nurse Communication: Mobility status PT Visit Diagnosis: Unsteadiness on feet (R26.81);Other abnormalities of gait and mobility (R26.89);Difficulty in walking, not elsewhere  classified (R26.2)    Time: 6301-6010 PT Time Calculation (min) (ACUTE ONLY): 23 min   Charges:   PT Evaluation $PT Eval Moderate Complexity: 1 Mod          Raymond Gurney, PT, DPT Acute Rehabilitation Services  Pager: [phone] Office: [phone]   Jewel Baize 02/18/2021, 12:53 PM

## 2021-02-18 NOTE — Progress Notes (Signed)
[facility] MD aware of DVT in right arm, no new orders. Will continue to monitor.

## 2021-02-18 NOTE — Progress Notes (Signed)
[facility] ADULT ICU REPLACEMENT PROTOCOL   The patient does apply for the [facility] Adult ICU Electrolyte Replacment Protocol based on the criteria listed below:   1. Is GFR >/= 30 ml/min? Yes.    Patient's GFR today is >60 2. Is SCr </= 2? Yes.   Patient's SCr is 1.15 ml/kg/hr 3. Did SCr increase >/= 0.5 in 24 hours? No. 4. Abnormal electrolyte(s): K3.4 5. Ordered repletion with: protocol 6. If a panic level lab has been reported, has the CCM MD in charge been notified? Yes.  .   Physician:  Dr. Larene Beach, Lilia Argue 02/18/2021 5:34 AM

## 2021-02-19 DIAGNOSIS — J9690 Respiratory failure, unspecified, unspecified whether with hypoxia or hypercapnia: Secondary | ICD-10-CM

## 2021-02-19 DIAGNOSIS — I2609 Other pulmonary embolism with acute cor pulmonale: Secondary | ICD-10-CM | POA: Diagnosis not present

## 2021-02-19 DIAGNOSIS — I616 Nontraumatic intracerebral hemorrhage, multiple localized: Secondary | ICD-10-CM | POA: Diagnosis not present

## 2021-02-19 DIAGNOSIS — I82621 Acute embolism and thrombosis of deep veins of right upper extremity: Secondary | ICD-10-CM | POA: Diagnosis not present

## 2021-02-19 DIAGNOSIS — I82433 Acute embolism and thrombosis of popliteal vein, bilateral: Secondary | ICD-10-CM | POA: Diagnosis not present

## 2021-02-19 LAB — CBC
HCT: 43.2 % (ref 39.0–52.0)
Hemoglobin: 14.8 g/dL (ref 13.0–17.0)
MCH: 33.2 pg (ref 26.0–34.0)
MCHC: 34.3 g/dL (ref 30.0–36.0)
MCV: 96.9 fL (ref 80.0–100.0)
Platelets: 208 10*3/uL (ref 150–400)
RBC: 4.46 MIL/uL (ref 4.22–5.81)
RDW: 13.9 % (ref 11.5–15.5)
WBC: 6.6 10*3/uL (ref 4.0–10.5)
nRBC: 0 % (ref 0.0–0.2)

## 2021-02-19 LAB — COMPREHENSIVE METABOLIC PANEL
ALT: 23 U/L (ref 0–44)
AST: 27 U/L (ref 15–41)
Albumin: 2.6 g/dL — ABNORMAL LOW (ref 3.5–5.0)
Alkaline Phosphatase: 32 U/L — ABNORMAL LOW (ref 38–126)
Anion gap: 5 (ref 5–15)
BUN: 5 mg/dL — ABNORMAL LOW (ref 6–20)
CO2: 27 mmol/L (ref 22–32)
Calcium: 8.6 mg/dL — ABNORMAL LOW (ref 8.9–10.3)
Chloride: 104 mmol/L (ref 98–111)
Creatinine, Ser: 1.25 mg/dL — ABNORMAL HIGH (ref 0.61–1.24)
GFR, Estimated: >60 mL/min (ref >60)
Glucose, Bld: 131 mg/dL — ABNORMAL HIGH (ref 70–99)
Potassium: 3.6 mmol/L (ref 3.5–5.1)
Sodium: 136 mmol/L (ref 135–145)
Total Bilirubin: 1 mg/dL (ref 0.3–1.2)
Total Protein: 6.4 g/dL — ABNORMAL LOW (ref 6.5–8.1)

## 2021-02-19 LAB — GLUCOSE, CAPILLARY
Glucose-Capillary: 127 mg/dL — ABNORMAL HIGH (ref 70–99)
Glucose-Capillary: 128 mg/dL — ABNORMAL HIGH (ref 70–99)
Glucose-Capillary: 82 mg/dL (ref 70–99)
Glucose-Capillary: 86 mg/dL (ref 70–99)
Glucose-Capillary: 88 mg/dL (ref 70–99)
Glucose-Capillary: 94 mg/dL (ref 70–99)
Glucose-Capillary: 98 mg/dL (ref 70–99)

## 2021-02-19 LAB — RPR: RPR Ser Ql: NONREACTIVE

## 2021-02-19 NOTE — Progress Notes (Signed)
PROGRESS NOTE    Danny Lowe  HRC:[id number] DOB: 1971/08/09 DOA: 02/15/2021 PCP: Patient, No Pcp Per   Chief Complaint  Patient presents with  . Code Stroke   Brief Narrative: 50 year old male with history of daily alcohol abuse, PTSD, depression recent admission for PE/DVT discharged on Eliquis who apparently went to [facility] to pick up his Eliquis prescription and was found down in [place] parking lot. Initially was admitted to ICU on 3/16 after he presented after syncopal event with initial negative CTH but noted to have right frontal/ occipital hematoma.  He was found to have unprovoked massive PE with right heart strain and extensive left DVT s/p half dose tPA.  Presented after syncopal event with initial negative CTH but noted to have right frontal/ occipital hematoma.  He was transitioned to Eliquis 3/18 and was hemodynamically stable and tolerated physical therapy without hypoxia or complications discharged home Patient was admitted for the stroke with reported seizure activity and worsening mental status, developed tonic-clonic seizure in the CT scan CT head showed right lateral cerebellar and temporal IPH with right to left shunt 1 to 2 mm, was intubated for airway protection given Ativan Keppra, Kcentra, and was also hypotensive that improved with bolus. Patient is admitted to ICU. Significant events  3/19 discharged for PE/ DVT found down 2hrs later in parking lot; code stroke-> found with ICH, witnessed seizure, and intubated   3/19 CT head shows right frontal parietal circumscribed hematoma in the right cerebellar circumscribed hematoma.  Minimal to no adjacent edema.  3/20 EEG monitoring showed right temporal focus of irritability but no seizures. IVC filter placed.   3/21 extubated. Some signs of ETOH withdrawal treated effectively with low dose ativan CIWA  3/23- TRH assumed care.  Subjective: Seen and examined this morning.  His mother is at the bedside.Patient  is alert awake oriented, on room air. Patient denies any anxiety, no tremors.  Has not needed extra Ativan.   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  MRI stable ICH x3, MRI unremarkable.Per critical care medicine.Unrelated to his fall, but likely related to with intoxication and delirium tremens his fall versus seizure probably resulted in atypical traumatic intracranial hemorrhages.  Neurology following.  Continue PT OT. Cont seizure precaution supportive care.  Per neurology continue statin at discharge LDL at 86, recommend to resume anticoagulation 4 weeks after ICH resolves, will need to resume his anticoagulation with neurology.  HbA1c 4.8, LDL 86.  Seizure episode:observed on 3/19-EEG showed epileptogenic potential but no seizure.Neurology on board.  Loaded with Keppra and currently on Keppra 1500 MG BID.S/P LTM EEG-that showed slow surgery background with focal slowing and LPD's in the right temporal region consistent with a focal cerebral lesion with epileptogenic potential. Stat EEG 321 mild diffuse encephalopathy persistent slowing in the right temporal> central region consistent with focal neuronal dysfunction,suspected discharges intermittently/frequently seen in the right temporal region consistent with an epileptiform foci.   Recent massive PE  3/16/recent lower extremity upper extremity DVT/age indeterminate right radial vein and right ulnar vein DVT this admission:2D echo 02/13/21-severely dilatation and hypocontractility of the mid basal RV,consistent with RV strain from PE. Anticoagulation discontinued-IVC filter placed 3/10.Not a candidate for anticoagulation due to #1.  Will need follow-up to resume his anticoagulation in 4 weeks.  Hypercoagulable work-up pending.  Acute respiratory failure with hypoxia and hypercapnia:intubated during SEIZUREairway protection and extubated  3/21.  Alcohol abuse with some signs of withdrawal on 3/21 and was placed on low-dose. CIWA Ativan.He drank 6 beers between his previous discharge and admission 319.Continue vitamins/thiamine.  Right upper lobe nodule:Outpatient follow-up with pulmonary.  Anxiety/depression:Cont home meds BuSpar/trazodone for sleep.  Hypokalemia-repleted.  Hypotension-resolved.  Morbid obesity with patient's Body mass index is 32.1 kg/m: WILL Benefit with PCP follow-up and weight loss.  Constipation - continue MiraLAX  Diet Order            Diet regular Room service appropriate? Yes with Assist; Fluid consistency: Thin  Diet effective now                 DVT prophylaxis: scd-no chemical prophylaxis due to ICH Code Status:   Code Status: Full Code  Family Communication: plan of care discussed with patient at bedside. Discussed with his mother at the bedside.  Status is: Inpatient Remains inpatient appropriate because:Inpatient level of care appropriate due to severity of illness  Dispo: The patient is from: Home              Anticipated d/c is to: TBD-likely home once cleared by neurology, pending PT eval.              Patient currently is not medically stable to d/c.   Difficult to place patient No    Unresulted Labs (From admission, onward)          Start     Ordered   02/19/21 0839  Comprehensive metabolic panel  ONCE - STAT,   STAT       Question:  Specimen collection method  Answer:  Lab=Lab collect   02/19/21 0838   02/19/21 0839  CBC  ONCE - STAT,   STAT       Question:  Specimen collection method  Answer:  Lab=Lab collect   02/19/21 1610   02/18/21 1933  RPR  Once,   R       Question:  Specimen collection method  Answer:  Lab=Lab collect   02/18/21 1933   02/18/21 1932  Protein C activity  (Hypercoagulable Panel, Comprehensive (PNL))  Once,   R       Question:  Specimen collection method  Answer:  Lab=Lab collect   02/18/21 1933   02/18/21 1932  Protein C, total   (Hypercoagulable Panel, Comprehensive (PNL))  Once,   R       Question:  Specimen collection method  Answer:  Lab=Lab collect   02/18/21 1933   02/18/21 1932  Protein S activity  (Hypercoagulable Panel, Comprehensive (PNL))  Once,   R       Question:  Specimen collection method  Answer:  Lab=Lab collect   02/18/21 1933   02/18/21 1932  Protein S, total  (Hypercoagulable Panel, Comprehensive (PNL))  Once,   R       Question:  Specimen collection method  Answer:  Lab=Lab collect   02/18/21 1933   02/18/21 1932  Lupus anticoagulant panel  (Hypercoagulable Panel, Comprehensive (PNL))  Once,   R       Question:  Specimen collection method  Answer:  Lab=Lab collect   02/18/21 1933   02/18/21 1932  Beta-2-glycoprotein i abs, IgG/M/A  (Hypercoagulable Panel, Comprehensive (PNL))  Once,   R       Question:  Specimen collection method  Answer:  Lab=Lab collect   02/18/21 1933   02/18/21 1932  Homocysteine, serum  (Hypercoagulable Panel, Comprehensive (PNL))  Once,   R  Question:  Specimen collection method  Answer:  Lab=Lab collect   02/18/21 1933   02/18/21 1932  Factor 5 leiden  (Hypercoagulable Panel, Comprehensive (PNL))  Once,   R       Question:  Specimen collection method  Answer:  Lab=Lab collect   02/18/21 1933   02/18/21 1932  Prothrombin gene mutation  (Hypercoagulable Panel, Comprehensive (PNL))  Once,   R       Question:  Specimen collection method  Answer:  Lab=Lab collect   02/18/21 1933   02/18/21 1932  Cardiolipin antibodies, IgG, IgM, IgA  (Hypercoagulable Panel, Comprehensive (PNL))  Once,   R       Question:  Specimen collection method  Answer:  Lab=Lab collect   02/18/21 1933   02/18/21 1932  ANA, IFA (with reflex)  Once,   R       Question:  Specimen collection method  Answer:  Lab=Lab collect   02/18/21 1933         Medications reviewed:  Scheduled Meds: . busPIRone  10 mg Oral Daily  . Chlorhexidine Gluconate Cloth  6 each Topical Daily  . docusate sodium  100  mg Oral BID  . folic acid  1 mg Oral Daily  . pantoprazole  40 mg Oral QHS  . polyethylene glycol  17 g Oral Daily  . sodium chloride flush  3 mL Intravenous Once  . thiamine  100 mg Oral Daily   Continuous Infusions: . levETIRAcetam 1,500 mg (02/19/21 0511)    Consultants:see note  Procedures:see note  Antimicrobials: Anti-infectives (From admission, onward)   None     Culture/Microbiology No results found for: SDES, SPECREQUEST, CULT, REPTSTATUS  Other culture-see note  Objective: Vitals: Today's Vitals   02/18/21 1500 02/18/21 1945 02/19/21 0009 02/19/21 0344  BP: 127/79 102/63 99/70 120/80  Pulse: 87 88 75 83  Resp: (!) Temp: 98.7 F (37.1 C) 99.1 F (37.3 C) 98.9 F (37.2 C) 98.6 F (37 C)  TempSrc: Oral Oral Oral Oral  SpO2: 99% 99% 99% 100%  Weight:      PainSc:        Intake/Output Summary (Last 24 hours) at 02/19/2021 0841 Last data filed at 02/19/2021 0052 Gross per 24 hour  Intake 26.5 ml  Output 1050 ml  Net -1023.5 ml   Filed Weights   02/16/21 0400 02/17/21 0400  Weight: 98 kg 98.6 kg   Weight change:   Intake/Output from previous day: 03/22 0701 - 03/23 0700 In: 26.5 [IV Piggyback:26.5] Out: 1050 [Urine:1050] Intake/Output this shift: No intake/output data recorded. Filed Weights   02/16/21 0400 02/17/21 0400  Weight: 98 kg 98.6 kg    Examination: General exam:AAOx3 ,NAD, weak appearing. HEENT:Oral mucosa moist, Ear/Nose WNL grossly,dentition normal. Respiratory system:Bilaterally diminished,no use of accessory muscle, non tender. Cardiovascular system:S1 & S2 +,regular, No JVD. Gastrointestinal system:Abdomen soft, NT,ND, BS+. Nervous System:Alert, awake, moving extremities and grossly nonfocal Extremities: No edema, distal peripheral pulses palpable.  Skin: No rashes,no icterus. MSK: Normal muscle bulk,tone, power  Data Reviewed: I have personally reviewed following labs and imaging studies CBC: Recent Labs  Lab  02/15/21 0143 02/15/21 1509 02/15/21 1510 02/15/21 1638 02/16/21 0138 02/17/21 0903 02/18/21 0105  WBC 8.2  --  10.8*  --  7.6 9.1 6.1  NEUTROABS  --   --  8.1*  --   --   --   --   HGB 15.1   < > 16.3 15.6 13.8 15.2 14.0  HCT 43.9   < > 50.2 46.0 40.1 45.3 42.0  MCV 98.2  --  102.2*  --  98.5 99.8 99.1  PLT 150  --  182  --  148* 150 164   < > = values in this interval not displayed.   Basic Metabolic Panel: Recent Labs  Lab 02/12/21 2200 02/12/21 2230 02/15/21 0143 02/15/21 1509 02/15/21 1510 02/15/21 1638 02/16/21 0138 02/16/21 1713 02/17/21 0548 02/17/21 0903 02/18/21 0105  NA  --    < > 135 137 137 139 137  --   --  134* 136  K  --    < > 3.9 3.8 3.9 4.2 3.7  --   --  3.9 3.4*  CL  --    < > 103 102 102  --  102  --   --  102 105  CO2  --    < > 25  --  25  --  25  --   --  25 24  GLUCOSE  --    < > 93 149* 146*  --  77  --   --  90 87  BUN  --    < > 6 6 6   --  6  --   --  8 9  CREATININE  --    < > 1.15 1.10 1.25*  --  0.98  --   --  1.19 1.15  CALCIUM  --    < > 8.2*  --  8.9  --  8.2*  --   --  8.1* 8.1*  MG 1.5*  --   --   --   --   --  1.5* 1.5* 1.9  --   --   PHOS  --   --   --   --   --   --   --  2.8 3.5  --   --    < > = values in this interval not displayed.   GFR: Estimated Creatinine Clearance: 90 mL/min (by C-G formula based on SCr of 1.15 mg/dL). Liver Function Tests: Recent Labs  Lab 02/12/21 2200 02/13/21 0624 02/14/21 0531 02/15/21 0143 02/15/21 1510  AST 80* 48* 67* 37 40  ALT 25 27 36 32 34  ALKPHOS 26* 35* 33* 39 40  BILITOT 1.1 2.0* 1.2 0.8 0.6  PROT 5.0* 6.2* 5.7* 5.6* 7.2  ALBUMIN 2.2* 2.6* 2.4* 2.3* 3.0*   Recent Labs  Lab 02/12/21 2200  LIPASE 26   Recent Labs  Lab 02/13/21 0008  AMMONIA 40*   Coagulation Profile: Recent Labs  Lab 02/12/21 2358 02/13/21 0624 02/15/21 1510 02/16/21 0138  INR 1.2 1.8* 1.3* 1.1   Cardiac Enzymes: Recent Labs  Lab 02/13/21 0007  CKTOTAL 241   BNP (last 3 results) No  results for input(s): PROBNP in the last 8760 hours. HbA1C: No results for input(s): HGBA1C in the last 72 hours. CBG: Recent Labs  Lab 02/18/21 0751 02/18/21 1617 02/18/21 2024 02/19/21 0016 02/19/21 0416  GLUCAP 80 84 93 82 86   Lipid Profile: No results for input(s): CHOL, HDL, LDLCALC, TRIG, CHOLHDL, LDLDIRECT in the last 72 hours. Thyroid Function Tests: No results for input(s): TSH, T4TOTAL, FREET4, T3FREE, THYROIDAB in the last 72 hours. Anemia Panel: Recent Labs    02/18/21 2026  VITAMINB12 221   Sepsis Labs: Recent Labs  Lab 02/12/21 2223 02/13/21 0002  LATICACIDVEN 5.6* 3.9*    Recent Results (from the past 240 hour(s))  Resp Panel  by RT-PCR (Flu A&B, Covid) Nasopharyngeal Swab     Status: None   Collection Time: 02/12/21 10:00 PM   Specimen: Nasopharyngeal Swab; Nasopharyngeal(NP) swabs in vial transport medium  Result Value Ref Range Status   SARS Coronavirus 2 by RT PCR NEGATIVE NEGATIVE Final    Comment: (NOTE) SARS-CoV-2 target nucleic acids are NOT DETECTED.  The SARS-CoV-2 RNA is generally detectable in upper respiratory specimens during the acute phase of infection. The lowest concentration of SARS-CoV-2 viral copies this assay can detect is 138 copies/mL. A negative result does not preclude SARS-Cov-2 infection and should not be used as the sole basis for treatment or other patient management decisions. A negative result may occur with  improper specimen collection/handling, submission of specimen other than nasopharyngeal swab, presence of viral mutation(s) within the areas targeted by this assay, and inadequate number of viral copies(<138 copies/mL). A negative result must be combined with clinical observations, patient history, and epidemiological information. The expected result is Negative.  Fact Sheet for Patients:  BloggerCourse.com  Fact Sheet for Healthcare Providers:   SeriousBroker.it  This test is no t yet approved or cleared by the [place] FDA and  has been authorized for detection and/or diagnosis of SARS-CoV-2 by FDA under an Emergency Use Authorization (EUA). This EUA will remain  in effect (meaning this test can be used) for the duration of the COVID-19 declaration under Section 564(b)(1) of the Act, 21 U.S.C.section 360bbb-3(b)(1), unless the authorization is terminated  or revoked sooner.       Influenza A by PCR NEGATIVE NEGATIVE Final   Influenza B by PCR NEGATIVE NEGATIVE Final    Comment: (NOTE) The Xpert Xpress SARS-CoV-2/FLU/RSV plus assay is intended as an aid in the diagnosis of influenza from Nasopharyngeal swab specimens and should not be used as a sole basis for treatment. Nasal washings and aspirates are unacceptable for Xpert Xpress SARS-CoV-2/FLU/RSV testing.  Fact Sheet for Patients: BloggerCourse.com  Fact Sheet for Healthcare Providers: SeriousBroker.it  This test is not yet approved or cleared by the [place] FDA and has been authorized for detection and/or diagnosis of SARS-CoV-2 by FDA under an Emergency Use Authorization (EUA). This EUA will remain in effect (meaning this test can be used) for the duration of the COVID-19 declaration under Section 564(b)(1) of the Act, 21 U.S.C. section 360bbb-3(b)(1), unless the authorization is terminated or revoked.  Performed at [facility], [address] [place], [place] [address]   MRSA PCR Screening     Status: None   Collection Time: 02/13/21  4:35 AM   Specimen: Nasal Mucosa; Nasopharyngeal  Result Value Ref Range Status   MRSA by PCR NEGATIVE NEGATIVE Final    Comment:        The GeneXpert MRSA Assay (FDA approved for NASAL specimens only), is one component of a comprehensive MRSA colonization surveillance program. It is not intended to diagnose  MRSA infection nor to guide or monitor treatment for MRSA infections. Performed at [facility], [address] [place], [place] [address]   Resp Panel by RT-PCR (Flu A&B, Covid) Nasopharyngeal Swab     Status: None   Collection Time: 02/15/21  3:36 PM   Specimen: Nasopharyngeal Swab; Nasopharyngeal(NP) swabs in vial transport medium  Result Value Ref Range Status   SARS Coronavirus 2 by RT PCR NEGATIVE NEGATIVE Final    Comment: (NOTE) SARS-CoV-2 target nucleic acids are NOT DETECTED.  The SARS-CoV-2 RNA is generally detectable in upper respiratory specimens during the  acute phase of infection. The lowest concentration of SARS-CoV-2 viral copies this assay can detect is 138 copies/mL. A negative result does not preclude SARS-Cov-2 infection and should not be used as the sole basis for treatment or other patient management decisions. A negative result may occur with  improper specimen collection/handling, submission of specimen other than nasopharyngeal swab, presence of viral mutation(s) within the areas targeted by this assay, and inadequate number of viral copies(<138 copies/mL). A negative result must be combined with clinical observations, patient history, and epidemiological information. The expected result is Negative.  Fact Sheet for Patients:  BloggerCourse.com  Fact Sheet for Healthcare Providers:  SeriousBroker.it  This test is no t yet approved or cleared by the [place] FDA and  has been authorized for detection and/or diagnosis of SARS-CoV-2 by FDA under an Emergency Use Authorization (EUA). This EUA will remain  in effect (meaning this test can be used) for the duration of the COVID-19 declaration under Section 564(b)(1) of the Act, 21 U.S.C.section 360bbb-3(b)(1), unless the authorization is terminated  or revoked sooner.       Influenza A by PCR NEGATIVE NEGATIVE Final   Influenza B by PCR  NEGATIVE NEGATIVE Final    Comment: (NOTE) The Xpert Xpress SARS-CoV-2/FLU/RSV plus assay is intended as an aid in the diagnosis of influenza from Nasopharyngeal swab specimens and should not be used as a sole basis for treatment. Nasal washings and aspirates are unacceptable for Xpert Xpress SARS-CoV-2/FLU/RSV testing.  Fact Sheet for Patients: BloggerCourse.com  Fact Sheet for Healthcare Providers: SeriousBroker.it  This test is not yet approved or cleared by the [place] FDA and has been authorized for detection and/or diagnosis of SARS-CoV-2 by FDA under an Emergency Use Authorization (EUA). This EUA will remain in effect (meaning this test can be used) for the duration of the COVID-19 declaration under Section 564(b)(1) of the Act, 21 U.S.C. section 360bbb-3(b)(1), unless the authorization is terminated or revoked.  Performed at [facility], [address] [place], [place] [address]      Radiology Studies: EEG adult  Result Date: 03/04/2021 Lilian Coma, MD     2021/03/04 12:42 PM EEG Report Indication: possible seizure activity witnessed vs syncope--recent fall/contusion  This study was recorded in the waking/drowsy state.  The duration of the study was 23 minutes.  Electrodes were placed according to the International 10/20 system.  Video was reviewed for clinical correlation as needed.  In the waking state, some degree of background organization is seen with an attenuated but perceptible anterior - posterior voltage and frequency gradient.  In the occipital leads there is a symmetric and reactive posterior dominant rhythm of approximately 8-9 hertz, which is less distinct/defined compared to normal.  Anteriorly, is the expected pattern of faster frequency, lower voltage waveforms though in a mildly reduced amount as compared to normal, and particularly with increased delta slowing with overriding fast  frequencies which is seen throughout. There is a clear and persistent region of diminished fast frequencies in the right temporal>central region.  There are also sharp waves in the right temporal region, with a voltage maximum at lead T8.  These are seen frequently during the study, but always in isolation and without clear clustering.  There is no evident drowsy organization, although there are periods--likely corresponding to drowsiness--in which there is some slight drop out of the faster frequencies, attenuation and of the gradient and disappearance of the posterior dominant rhythm. Hyperventilation: deferred Photic stimulation: unremarkable  Impression:  This is an abnormal waking and drowsy study due to 1) attenuated organization as described above--clinically most consistent with a mild diffuse encephalopathy, 2) persistent slowing in the right temporal>central region, consistent with focal neuronal dysfunction and 3) suspected discharges intermittently/frequently seen in the right temporal region consistent with an epileptiform foci, although without clustering/always in isolation, and without seizure activity at any point.    VAS [place] UPPER EXTREMITY VENOUS DUPLEX  Result Date: 02/19/2021 UPPER VENOUS STUDY  Indications: Pain Comparison Study: no prior Performing Technologist: Blanch Media RVS  Examination Guidelines: A complete evaluation includes B-mode imaging, spectral Doppler, color Doppler, and power Doppler as needed of all accessible portions of each vessel. Bilateral testing is considered an integral part of a complete examination. Limited examinations for reoccurring indications may be performed as noted.  Right Findings: +----------+------------+---------+-----------+----------+-----------------+ RIGHT     CompressiblePhasicitySpontaneousProperties     Summary      +----------+------------+---------+-----------+----------+-----------------+ IJV           Full       Yes       Yes                                 +----------+------------+---------+-----------+----------+-----------------+ Subclavian    Full       Yes       Yes                                +----------+------------+---------+-----------+----------+-----------------+ Axillary      Full       Yes       Yes                                +----------+------------+---------+-----------+----------+-----------------+ Brachial      Full       Yes       Yes                                +----------+------------+---------+-----------+----------+-----------------+ Radial        None                                  Age Indeterminate +----------+------------+---------+-----------+----------+-----------------+ Ulnar         None                                  Age Indeterminate +----------+------------+---------+-----------+----------+-----------------+ Cephalic      Full                                                    +----------+------------+---------+-----------+----------+-----------------+ Basilic       Full                                                    +----------+------------+---------+-----------+----------+-----------------+  Summary:  Right: Findings consistent with age indeterminate deep vein thrombosis involving the right  radial veins and right ulnar veins.  *See table(s) above for measurements and observations.  Diagnosing physician: Waverly Ferrari MD Electronically signed by Waverly Ferrari MD on 02/19/2021 at 5:16:09 AM.    Final      LOS: 4 days   Lanae Boast, MD [facility]  02/19/2021, 8:41 AM

## 2021-02-19 NOTE — TOC CAGE-AID Note (Signed)
Transition of Care [facility]) - CAGE-AID Screening   Patient Details  Name: Danny Lowe MRN: [id number] Date of Birth: Apr 07, 1971  Transition of Care [facility]) CM/SW Contact:    Kermit Balo, RN Phone Number: 02/19/2021, 8:30 AM   Clinical Narrative: Patient offered information on counseling for alcohol use. Pt refused.    CAGE-AID Screening: Substance Abuse Screening unable to be completed due to: : Patient unable to participate  Have You Ever Felt You Ought to Cut Down on Your Drinking or Drug Use?: Yes Have People Annoyed You By Critizing Your Drinking Or Drug Use?: No Have You Felt Bad Or Guilty About Your Drinking Or Drug Use?: No Have You Ever Had a Drink or Used Drugs First Thing In The Morning to Steady Your Nerves or to Get Rid of a Hangover?: No CAGE-AID Score: 1  Substance Abuse Education Offered: Yes (patient refused)

## 2021-02-19 NOTE — Plan of Care (Signed)
  Problem: Education: Goal: Knowledge of disease or condition will improve Outcome: Progressing Goal: Knowledge of secondary prevention will improve Outcome: Progressing Goal: Knowledge of patient specific risk factors addressed and post discharge goals established will improve Outcome: Progressing   Problem: Coping: Goal: Will verbalize positive feelings about self Outcome: Progressing Goal: Will identify appropriate support needs Outcome: Progressing   Problem: Health Behavior/Discharge Planning: Goal: Ability to manage health-related needs will improve Outcome: Progressing   Problem: Self-Care: Goal: Ability to participate in self-care as condition permits will improve Outcome: Progressing Goal: Verbalization of feelings and concerns over difficulty with self-care will improve Outcome: Progressing Goal: Ability to communicate needs accurately will improve Outcome: Progressing   Problem: Nutrition: Goal: Risk of aspiration will decrease Outcome: Progressing Goal: Dietary intake will improve Outcome: Progressing   Problem: Intracerebral Hemorrhage Tissue Perfusion: Goal: Complications of Intracerebral Hemorrhage will be minimized Outcome: Progressing   

## 2021-02-19 NOTE — Progress Notes (Addendum)
STROKE TEAM PROGRESS NOTE    STROKE TEAM PROGRESS NOTE   HPI: [name] was recently admitted on 3/16 following a syncopal event.  CT head at the time showed a small right parieto-occipital scalp contusion w/o underlying fracture.He was found to have unprovoked massive PE with right heart strain and extensive left DVTand received tPA.He was transitioned to Eliquis 3/18 and was hemodynamically stable and tolerated physical therapy without hypoxia or complications and was discharged home. The patient drove to [facility] and had a syncopal episode inside of the store, awoken, exited the store, attempted to go to the incorrect car and then had another syncopal event with sz like activity.  EMS was called and he was taken to the ED.  Per report the patient had seizure like activity en route with worsening mental status.  On arrival, Eliquis was reversed with Kcentra.  Pt developed clinical seizure with unresponsiveness (staring ahead, nonverbal) and rhythmic shaking of his L arm and leg with intermittent spread to R leg. 2mg  IV ativan administered x2 without suppression. Pt desatted and became hypotensive. He was intubated and started on versed gtt for sedation and seizure control. He was loaded with keppra 40mg /kg and continued on 1500mg  bid.   INTERVAL HISTORY UE DVT noted overnight.   Denies complaints or concerns today. Smiling, talkative and visiting with family at bedside.  Neurologically stable.   Vitals:   02/18/21 1500 02/18/21 1945 02/19/21 0009 02/19/21 0344  BP: 127/79 102/63 99/70 120/80  Pulse: 87 88 75 83  Resp: (!) 21 18 19 18   Temp: 98.7 F (37.1 C) 99.1 F (37.3 C) 98.9 F (37.2 C) 98.6 F (37 C)  TempSrc: Oral Oral Oral Oral  SpO2: 99% 99% 99% 100%  Weight:       CBC:  Recent Labs  Lab 02/15/21 1510 02/15/21 1638 02/18/21 0105 02/19/21 0910  WBC 10.8*   < > 6.1 6.6  NEUTROABS 8.1*  --   --   --   HGB 16.3   < > 14.0 14.8  HCT 50.2   < > 42.0 43.2  MCV 102.2*    < > 99.1 96.9  PLT 182   < > 164 208   < > = values in this interval not displayed.   Basic Metabolic Panel:  Recent Labs  Lab 02/16/21 1713 02/17/21 0548 02/17/21 0903 02/18/21 0105 02/19/21 0910  NA  --   --    < > 136 136  K  --   --    < > 3.4* 3.6  CL  --   --    < > 105 104  CO2  --   --    < > 24 27  GLUCOSE  --   --    < > 87 131*  BUN  --   --    < > 9 5*  CREATININE  --   --    < > 1.15 1.25*  CALCIUM  --   --    < > 8.1* 8.6*  MG 1.5* 1.9  --   --   --   PHOS 2.8 3.5  --   --   --    < > = values in this interval not displayed.   Lipid Panel: Pending  HgbA1c: Pending Urine Drug Screen:  Recent Labs  Lab 02/15/21 1719  LABOPIA NONE DETECTED  COCAINSCRNUR NONE DETECTED  LABBENZ NONE DETECTED  AMPHETMU NONE DETECTED  THCU POSITIVE*  LABBARB NONE DETECTED  Alcohol Level  Recent Labs  Lab 02/15/21 1701  ETH <10    IMAGING past 24 hours CT HEAD WO CONTRAST Result Date: 02/15/2021 IMPRESSION: Stable areas of hemorrhage in the right temporal lobe and right cerebellum. No new areas of hemorrhage. Electronically   CT CHEST W CONTRAST Result Date: 02/15/2021 IMPRESSION:  1. Slightly improved but persistently extensive bilateral central pulmonary emboli that extend to all pulmonary lobes. Persistent but improved secondary findings of right heart strain.  2. Interval increase in left lower lobe hypodense consolidation likely representing infection/inflammation.  3. Marked bilateral gynecomastia that appears to be asymmetric, left greater than right. Given asymmetry and patient age/gender, recommend correlation with mammography to exclude an underlying lesion.  4. Indeterminate 4 mm right upper lobe nodule.  5. No acute traumatic injury to the chest, abdomen, or pelvis.  6. No acute fracture or traumatic malalignment of the thoracic or lumbar spine.  7. Lines and tubes in appropriate position.   CT CERVICAL SPINE WO CONTRAST Result Date:  02/15/2021 IMPRESSION: No acute bony abnormality in the cervical spine. Cervical spondylosis. Right pleural effusion and right upper lobe airspace disease partially imaged peer  CT ABDOMEN PELVIS W CONTRAST Result Date: 02/15/2021  IMPRESSION:  1. Slightly improved but persistently extensive bilateral central pulmonary emboli that extend to all pulmonary lobes. Persistent but improved secondary findings of right heart strain.  2. Interval increase in left lower lobe hypodense consolidation likely representing infection/inflammation.  3. Marked bilateral gynecomastia that appears to be asymmetric, left greater than right. Given asymmetry and patient age/gender, recommend correlation with mammography to exclude an underlying lesion.  4. Indeterminate 4 mm right upper lobe nodule.  5. No acute traumatic injury to the chest, abdomen, or pelvis.  6. No acute fracture or traumatic malalignment of the thoracic or lumbar spine.  7. Lines and tubes in appropriate position.   DG Chest Port 1 View Result Date: 02/16/2021 IMPRESSION: Patchy opacity of left lung base, pneumonia is not excluded. Previously noted bilateral upper lobe airspace opacities have resolved, minimal residual atelectasis is identified in the right upper lobe. Electronically Signed   By: Sherian Rein M.D.   On: 02/16/2021 09:23   DG Chest Portable 1 View  Result Date: 02/15/2021 IMPRESSION: ET tube approximately 4.4 cm above the carina. Cardiomegaly, vascular congestion. New bilateral upper lobe airspace opacities.   Overnight EEG with video  Result Date: 02/16/2021 Impression: This was an abnormal continuous video EEG due to a slow, sedated background with focal slowing and LPDs in the right temporal region, 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 No seizures were seen.   CT HEAD CODE STROKE WO CONTRAST Result Date: 02/15/2021  IMPRESSION: Right lateral cerebellar intraparenchymal  hemorrhage measuring 4 cc volume with mild surrounding edema. Right lateral temporal intraparenchymal hematoma measuring 10 cc volume with mild surrounding edema. 6 mm right temporal intraparenchymal hematoma adjacent to the temporal tip of the lateral ventricle. Mild mass effect with 1-2 cm of right-to-left shift. No skull fracture or extra-axial bleed. These abnormalities were not visible in any way on the study 3 days ago.    PHYSICAL EXAM General - Well nourished, well developed, in no apparent distress,  HEENT: [place]/AT, PERRL, no JVD CV: RRR, no mRG PULM:  Clear bilateral breath sounds GI: Soft, non-tender, normoactive  Skin: Grossly intact.   Mental Status -  Opens eyes to voice, Oriented to person, place, and time.   Language including expression, naming, repetition, comprehension  were intact. Good attention span and concentration.  Cranial Nerves II - XII - II - Visual fields full III, IV, VI - Extraocular movements intact. V - Facial sensation intact bilaterally. VII - Facial movement symmetric. VIII - Hearing & vestibular intact.  Able to follow verbal commands XII - Tongue protrusion intact.  Motor Strength - The patient is able to move all 4 extremities.  LUE 4/5, LLE 4/5, RUE 4-/4, RLE 4/5.  Able to follow commands with all 4s (thumbs up, 2 fingers and wiggles toes).  Bulk was normal.    Sensory - intact Coordination - FNF ataxia RUE Gait and Station - deferred.    ASSESSMENT/PLAN Mr. Robin SearingJermaine Colwell is a 50 y.o. male with history of PE/DVT on Eliquis, ETOH, PTSD, presenting following a syncopal event.  CT head revealed R temporal region (10cc) and R cerebellum (4cc).      ICH - R lateral cerebellar and temporal lobe ICH in the setting of Eliquis use, reversed by Kcentra  CT head: right lateral cerebellar IPH, right lateral temporal IPH x 2  Repeat CT head: Stable ICH   MRI stable ICH x 3  MRA unremarkable  2D Echo (02/13/21) 60-65%, severe dilatation and  hypocontractility of the mid-basal RV, consistent with RV strain from PE  LDL 86  HgbA1c 4.8  Hypercoag labs shows Antithrombin III wnl otherwise are pending  VTE prophylaxis - SCDx Diet: NPO  Eliquis (apixaban) daily prior to admission, now on No antithrombotic due to ICH.  Therapy recommendations:  Outpatient PT (neuro clinic), outpatient OT   Disposition:  Pending  Stroke team will sign off and plan to follow hypercoag labs  Follow up in stroke clinic 4 weeks, appt requested   Seizures  Clinical seizures (3/19): staring ahead, nonverbal and rhythmic shaking of his L arm and leg   Loaded with Keppra then Keppra 1500mg  bid  LTM EEG - slow, sedated background with focal slowing and LPDs in the right temporal region, 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 No seizures were seen.  Stat EEG 3/21 mild diffuse encephalopathy, 2) persistent slowing in the right temporal>central region, consistent with focal neuronal dysfunction and 3) suspected discharges intermittently/frequently seen in the right temporal region consistent with an epileptiform foci, although without clustering/always in isolation, and without seizure activity at any point.   Pulmonary Embolism  CT chest (3/19): Slightly improved but persistently extensive bilateral central pulmonary emboli that extend to all pulmonary lobes.   Eliquis prior to admission  Not a candidate for full anticoagulation at this time due to intracerebral hemorrhages  Hypercoagulable work up pending  Recommend to resume anticoagulation in 3-4 weeks after ICH resolves  DVTs  Eliquis prior to admission  IVC Filter placement (3/20)  Not a candidate for full anticoagulation at this time due to intracerebral hemorrhages  Right Upper Extremity arm pain/frozen shoulder -> RUE Doppler -> age indeterminate deep vein thrombosis involving the right radial veins and right ulnar veins.    Recommend to resume anticoagulation in 3-4 weeks after ICH resolves  Acute Respiratory failure  Intubated in the ED after multiple seizure episodes  Extubated 3/21, stable on [place]  Hypotension  Improving now off of sedation  MAP goal >65, SBP goal <160  Consider fluid bolus if BP drops  Hyperlipidemia  Home meds:  none  LDL 86, goal < 70  Consider starting statin  at discharge   Heavy alcholic  Heavy drinker  Discharged home 3/19 and drank  6 beers at home before re-admission the same day  evidence of withdrawal on 3/21 improved with low dose ativan  CIWA protocol  Alcohol withdraw precaution  B1/FA/MVI  Anxiety/Depression  Resume home buspar 10mg  daily  Resume home trazodone 100mg  for sleep  Hypokalemia  Potassium level 3.9->3.4   Supplement potassium chloride x1  Other Stroke Risk Factors  Substance abuse - UDS:  THC POSITIVE. Patient will be advised to stop using due to stroke risk.  Obesity, Body mass index is 32.1 kg/m., BMI >/= 30 associated with increased stroke risk, recommend weight loss, diet and exercise as appropriate   Coronary artery disease  Congestive heart failure  Other Active Problems  Incidental finding of a 40mm right upper lobe lung nodule on CT chest (02/15/21): follow-up with PCP  Hospital day # 4  This plan of care was directed by Dr. 11m.  02/17/21, NP-C   ATTENDING NOTE: I reviewed above note and agree with the assessment and plan. Pt was seen and examined.   Patient doing well, no acute event overnight, no complaints.  Girlfriend at bedside.  Patient neurologically intact at this time.  However, right upper extremity venous Doppler showed age indeterminate DVT involving right radial and right ulnar veins.  Given his clot burden, had sent out hypercoagulable panel, result pending.  Recommend resume anticoagulation once ICH resolves, likely in 3 to 4 weeks.  Recommend close follow-up with neurology  clinic in 3 to 4 weeks for seizure repeat and resumption of anticoagulation.  Neurology will sign off. Please call with questions. Pt will follow up with stroke clinic Dr. Roda Shutters at [facility] in about 3-4 weeks. Thanks for the consult.   Pearlean Brownie, MD PhD Stroke Neurology 02/19/2021 7:39 PM       To contact Stroke Continuity provider, please refer to Marvel Plan. After hours, contact General Neurology

## 2021-02-19 NOTE — Progress Notes (Signed)
Physical Therapy Treatment Patient Details Name: Danny Lowe MRN: [id number] DOB: 1971-06-25 Today's Date: 02/19/2021    History of Present Illness 50 yo admitted 3/16 with syncope at home hitting face without fx and additional 2 syncopal events with EMS. Pt with massive bil PE and LLE DVT s/p tPA and heparin. Pt readmitted 3/19 after found down in [facility] parking lot just after d/c.  Pt with R frontal/occipital hematoma and R lateral cerebellar and temporal IPH with shift.  On 3/22, R UE DVT found. Pt with szs on arrival. PMH: PTSD, depression ETOH abuse.    PT Comments    Patient progressing towards physical therapy goals. Patient required min guard initially with ambulation, progressing to supervision. DGI performed with score of 19/24, indicative of increased risk of falls. Educated patient on fall risk and requesting help while in the hospital and when returning home initially. Continue to recommend OPPT following discharge.    Follow Up Recommendations  Outpatient PT (neuro clinic)     Equipment Recommendations  None recommended by PT    Recommendations for Other Services       Precautions / Restrictions Precautions Precautions: Fall Precaution Comments: seizure Restrictions Weight Bearing Restrictions: No    Mobility  Bed Mobility Overal bed mobility: Modified Independent                  Transfers Overall transfer level: Needs assistance Equipment used: None Transfers: Sit to/from Stand Sit to Stand: Supervision         General transfer comment: supervision for safety  Ambulation/Gait Ambulation/Gait assistance: Min guard;Supervision Gait Distance (Feet): 300 Feet Assistive device: None Gait Pattern/deviations: Step-through pattern;Wide base of support     General Gait Details: Performed DGI during ambulation, see below.   Stairs             Wheelchair Mobility    Modified Rankin (Stroke Patients Only) Modified Rankin (Stroke  Patients Only) Pre-Morbid Rankin Score: No symptoms Modified Rankin: Moderately severe disability     Balance Overall balance assessment: Mild deficits observed, not formally tested                               Standardized Balance Assessment Standardized Balance Assessment : Dynamic Gait Index   Dynamic Gait Index Level Surface: Normal Change in Gait Speed: Mild Impairment Gait with Horizontal Head Turns: Mild Impairment Gait with Vertical Head Turns: Mild Impairment Gait and Pivot Turn: Mild Impairment Step Over Obstacle: Mild Impairment Step Around Obstacles: Normal Steps: Normal Total Score: 19      Cognition Arousal/Alertness: Awake/alert Behavior During Therapy: WFL for tasks assessed/performed Overall Cognitive Status: Within Functional Limits for tasks assessed                                 General Comments: delayed processing at times when given tasks during ambulation      Exercises      General Comments        Pertinent Vitals/Pain Pain Assessment: Faces Faces Pain Scale: No hurt Pain Intervention(s): Monitored during session    Home Living                      Prior Function            PT Goals (current goals can now be found in the care plan section) Acute Rehab PT  Goals Patient Stated Goal: to get out of here PT Goal Formulation: With patient/family Time For Goal Achievement: 03/04/21 Potential to Achieve Goals: Good Progress towards PT goals: Progressing toward goals    Frequency    Min 4X/week      PT Plan Current plan remains appropriate    Co-evaluation              AM-PAC PT "6 Clicks" Mobility   Outcome Measure  Help needed turning from your back to your side while in a flat bed without using bedrails?: None Help needed moving from lying on your back to sitting on the side of a flat bed without using bedrails?: None Help needed moving to and from a bed to a chair (including a  wheelchair)?: A Little Help needed standing up from a chair using your arms (e.g., wheelchair or bedside chair)?: A Little Help needed to walk in hospital room?: A Little Help needed climbing 3-5 steps with a railing? : A Little 6 Click Score: 20    End of Session Equipment Utilized During Treatment: Gait belt Activity Tolerance: Patient tolerated treatment well Patient left: in bed;with call bell/phone within reach Nurse Communication: Mobility status PT Visit Diagnosis: Unsteadiness on feet (R26.81);Other abnormalities of gait and mobility (R26.89);Difficulty in walking, not elsewhere classified (R26.2)     Time: 1500-1520 PT Time Calculation (min) (ACUTE ONLY): 20 min  Charges:  $Therapeutic Activity: 8-22 mins                     [name] PT, DPT Acute Rehabilitation Services Pager [phone] Office [phone]    [name] 02/19/2021, 5:21 PM

## 2021-02-19 NOTE — TOC Initial Note (Signed)
Transition of Care [facility]) - Initial/Assessment Note    Patient Details  Name: Danny Lowe MRN: [id number] Date of Birth: [birth year]  [facility] CM/SW Contact:    Kermit Balo, RN Phone Number: 02/19/2021, 1:14 PM  Clinical Narrative:                 Patient states he lives with his 50 yo son. Pt denies issues with home medications or transportation. He has a brother and sister that will assist as needed.  Recommendations are for outpatient therapy. Pt interested in attending [facility]. Orders in Epic and information on the AVS. [facility]: [facility].  TOC following.  Expected Discharge Plan: OP Rehab Barriers to Discharge: Continued Medical Work up   Patient Goals and CMS Choice     Choice offered to / list presented to : Patient  Expected Discharge Plan and Services Expected Discharge Plan: OP Rehab   Discharge Planning Services: CM Consult                                          Prior Living Arrangements/Services   Lives with:: Adult Children Patient language and need for interpreter reviewed:: Yes Do you feel safe going back to the place where you live?: Yes      Need for Family Participation in Patient Care: Yes (Comment) Care giver support system in place?: Yes (comment)   Criminal Activity/Legal Involvement Pertinent to Current Situation/Hospitalization: No - Comment as needed  Activities of Daily Living Home Assistive Devices/Equipment: None ADL Screening (condition at time of admission) Patient's cognitive ability adequate to safely complete daily activities?: Yes Is the patient deaf or have difficulty hearing?: No Does the patient have difficulty seeing, even when wearing glasses/contacts?: No Does the patient have difficulty concentrating, remembering, or making decisions?: No Patient able to express need for assistance with ADLs?: Yes Does the patient have difficulty dressing or bathing?:  No Independently performs ADLs?: Yes (appropriate for developmental age) Does the patient have difficulty walking or climbing stairs?: No Weakness of Legs: None Weakness of Arms/Hands: None  Permission Sought/Granted                  Emotional Assessment Appearance:: Appears stated age Attitude/Demeanor/Rapport: Engaged Affect (typically observed): Accepting Orientation: : Oriented to Self,Oriented to Place,Oriented to  Time,Oriented to Situation Alcohol / Substance Use: Alcohol Use Psych Involvement: No (comment)  Admission diagnosis:  Respiratory failure (HCC) [J96.90] Cerebral hemorrhage (HCC) [I61.9] ICH (intracerebral hemorrhage) (HCC) [I61.9] Patient Active Problem List   Diagnosis Date Noted  . Acute respiratory failure with hypoxia and hypercapnia (HCC)   . ICH (intracerebral hemorrhage) (HCC) 02/15/2021  . Pulmonary embolism (HCC) 02/13/2021   PCP:  Patient, No Pcp Per Pharmacy:   [facility] - [place], [place] - [address] [place] [address] Phone: [phone] Fax: [phone]  [id number] [place], [place] - [address] AT [facility] [address] [place] [address] Phone: [phone] Fax: [phone]     Social Determinants of Health (SDOH) Interventions    Readmission Risk Interventions No flowsheet data found.

## 2021-02-20 ENCOUNTER — Other Ambulatory Visit: Payer: Self-pay | Admitting: Internal Medicine

## 2021-02-20 ENCOUNTER — Inpatient Hospital Stay: Payer: Self-pay | Admitting: Pulmonary Disease

## 2021-02-20 DIAGNOSIS — J9690 Respiratory failure, unspecified, unspecified whether with hypoxia or hypercapnia: Secondary | ICD-10-CM | POA: Diagnosis not present

## 2021-02-20 LAB — LUPUS ANTICOAGULANT PANEL
DRVVT: 27.1 s (ref 0.0–47.0)
PTT Lupus Anticoagulant: 31.6 s (ref 0.0–51.9)

## 2021-02-20 LAB — COMPREHENSIVE METABOLIC PANEL
ALT: 21 U/L (ref 0–44)
AST: 25 U/L (ref 15–41)
Albumin: 2.5 g/dL — ABNORMAL LOW (ref 3.5–5.0)
Alkaline Phosphatase: 36 U/L — ABNORMAL LOW (ref 38–126)
Anion gap: 6 (ref 5–15)
BUN: 5 mg/dL — ABNORMAL LOW (ref 6–20)
CO2: 28 mmol/L (ref 22–32)
Calcium: 8.5 mg/dL — ABNORMAL LOW (ref 8.9–10.3)
Chloride: 103 mmol/L (ref 98–111)
Creatinine, Ser: 1.15 mg/dL (ref 0.61–1.24)
GFR, Estimated: >60 mL/min (ref >60)
Glucose, Bld: 99 mg/dL (ref 70–99)
Potassium: 3.5 mmol/L (ref 3.5–5.1)
Sodium: 137 mmol/L (ref 135–145)
Total Bilirubin: 0.7 mg/dL (ref 0.3–1.2)
Total Protein: 6.5 g/dL (ref 6.5–8.1)

## 2021-02-20 LAB — CBC
HCT: 44.3 % (ref 39.0–52.0)
Hemoglobin: 14.7 g/dL (ref 13.0–17.0)
MCH: 32.7 pg (ref 26.0–34.0)
MCHC: 33.2 g/dL (ref 30.0–36.0)
MCV: 98.4 fL (ref 80.0–100.0)
Platelets: 232 10*3/uL (ref 150–400)
RBC: 4.5 MIL/uL (ref 4.22–5.81)
RDW: 13.7 % (ref 11.5–15.5)
WBC: 7.6 10*3/uL (ref 4.0–10.5)
nRBC: 0 % (ref 0.0–0.2)

## 2021-02-20 LAB — PROTEIN C, TOTAL: Protein C, Total: 91 % (ref 60–150)

## 2021-02-20 LAB — GLUCOSE, CAPILLARY
Glucose-Capillary: 101 mg/dL — ABNORMAL HIGH (ref 70–99)
Glucose-Capillary: 96 mg/dL (ref 70–99)
Glucose-Capillary: 93 mg/dL (ref 70–99)

## 2021-02-20 LAB — PROTEIN C ACTIVITY: Protein C Activity: 99 % (ref 73–180)

## 2021-02-20 LAB — HOMOCYSTEINE: Homocysteine: 18.2 umol/L — ABNORMAL HIGH (ref 0.0–14.5)

## 2021-02-20 LAB — PROTEIN S, TOTAL: Protein S Ag, Total: 83 % (ref 60–150)

## 2021-02-20 LAB — PROTEIN S ACTIVITY: Protein S Activity: 100 % (ref 63–140)

## 2021-02-20 LAB — ANTINUCLEAR ANTIBODIES, IFA: ANA Ab, IFA: NEGATIVE

## 2021-02-20 MED ORDER — THIAMINE HCL 100 MG PO TABS: 100.0000 mg | ORAL_TABLET | Freq: Every day | ORAL | 0 refills | Status: AC

## 2021-02-20 MED ORDER — LEVETIRACETAM 750 MG PO TABS: 1500.0000 mg | ORAL_TABLET | Freq: Two times a day (BID) | ORAL | 1 refills | Status: DC

## 2021-02-20 MED ORDER — FOLIC ACID 1 MG PO TABS: 1.0000 mg | ORAL_TABLET | Freq: Every day | ORAL | 0 refills | Status: AC

## 2021-02-20 MED ORDER — DOCUSATE SODIUM 100 MG PO CAPS: 100.0000 mg | ORAL_CAPSULE | Freq: Two times a day (BID) | ORAL | 0 refills | Status: DC

## 2021-02-20 MED FILL — FOLIC ACID 1 MG TABS: 1 | 30 days supply | Qty: 30 | Fill #0

## 2021-02-20 MED FILL — DOCUSATE SODIUM 100 MG CAPS: 100 | 5 days supply | Qty: 10 | Fill #0

## 2021-02-20 MED FILL — levETIRAcetam 750 MG TABS: 750 | 30 days supply | Qty: 120 | Fill #0

## 2021-02-20 MED FILL — VITAMIN B-1 100 MG TABS: 100 | 30 days supply | Qty: 30 | Fill #0

## 2021-02-20 NOTE — Discharge Summary (Signed)
Physician Discharge Summary  Danny Lowe ZOX:[id number] DOB: Oct 27, 1971 DOA: 02/15/2021  PCP: Patient, No Pcp Per  Admit date: 02/15/2021 Discharge date: 02/20/2021  Admitted From: home Disposition:  hom  Recommendations for Outpatient Follow-up:  1. Follow up with PCP in 1-2 weeks 2. Please obtain BMP/CBC in one week 3. Follow-up with neurology in 3 weeks with Dr. Pearlean Brownie 4. Please follow up on the following pending results: Hypercoagulable work-up  Home Health: Home Equipment/Devices: None  Discharge Condition: Stable Code Status:   Code Status: Full Code Diet recommendation:  Diet Order            Diet - low sodium heart healthy           Diet regular Room service appropriate? Yes with Assist; Fluid consistency: Thin  Diet effective now                  Brief/Interim Summary: 50 year old male with history of daily alcohol abuse, PTSD, depression recent admission for PE/DVT discharged on Eliquis who apparently went to [facility] to pick up his Eliquis prescription and was found down in [place] parking lot. Initially was admitted to ICU on 3/16 after he presented after syncopal event with initial negative CTH but noted to have right frontal/ occipital hematoma. He was found to have unprovoked massive PE with right heart strain and extensive left DVT s/p half dose tPA. Presented after syncopal event with initial negative CTH but noted to have right frontal/ occipital hematoma. He was transitioned to Eliquis 3/18 and was hemodynamically stable and tolerated physical therapy without hypoxia or complications discharged home Patient was admitted for the stroke with reported seizure activity and worsening mental status, developed tonic-clonic seizure in the CT scan CT head showed right lateral cerebellar and temporal IPH with right to left shunt 1 to 2 mm, was intubated for airway protection given Ativan Keppra, Kcentra, and was also hypotensive that improved with bolus. Patient  is admitted to ICU. Significant events  3/19 discharged for PE/ DVT found down 2hrs later in parking lot; code stroke->found with ICH, witnessed seizure, and intubated   3/19 CT head shows right frontal parietal circumscribed hematoma in the right cerebellar circumscribed hematoma. Minimal to no adjacent edema.  3/20 EEG monitoring showed right temporal focus of irritability but no seizures. IVC filter placed.   3/21extubated. Some signs of ETOH withdrawal treated effectively with low dose ativan CIWA  3/23- TRH assumed care Patient is stabilized.At this time he is alert and oriented x3.  Parents at the bedside. He has been ambulating well seen by PT/OT.Is medically stable to discharge home.   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  MRI stable ICH x3, MRI unremarkable.Per critical care medicine.Unrelated to his fall, but likely related to with intoxication and delirium tremens his fall versus seizure probably resulted in atypical traumatic intracranial hemorrhages.  Neurology following.  Continue PT OT. Cont seizure precaution supportive care.  Per neurology continue statin at discharge LDL at 86, recommend to resume anticoagulation 3-4 weeks after ICH resolves, will need to resume his anticoagulation with neurology.  HbA1c 4.8, LDL 86.  Patient is going to follow-up with neurology to discuss about anticoagulation resumption in 3 weeks.  Patient and patient's parents at the bedside aware about recommendation and follow-up instruction  Seizure episode:observed on 3/19-EEG showed epileptogenic potential but no seizure.Neurology on board.  Loaded with Keppra and  currently on Keppra 1500 MG BID.S/P LTM EEG-that showed slow surgery background with focal slowing and LPD's in the right temporal region consistent with a focal cerebral lesion with epileptogenic potential. Stat EEG 321  mild diffuse encephalopathy persistent slowing in the right temporal> central region consistent with focal neuronal dysfunction,suspected discharges intermittently/frequently seen in the right temporal region consistent with an epileptiform foci.   Neurology has seen and signed.  Recent massive PE  3/16/recent lower extremity upper extremity DVT/age indeterminate right radial vein and right ulnar vein DVT this admission:2D echo 02/13/21-severely dilatation and hypocontractility of the mid basal RV,consistent with RV strain from PE. Anticoagulation discontinued-IVC filter placed 3/10.Not a candidate for anticoagulation due to #1.  Will need follow-up to resume his anticoagulation in 4 weeks.  Hypercoagulable work-up pending.  I had discussed Dr. Chestine Spore from vascular surgery about [name] in detail, upper extremity DVTs are age-indeterminate, he already got IVC filter placed and no further recommendation at this time.  Patient has no upper extremity pain or redness.  He is has been ambulating well on room air with no chest pain and is hemodynamically stable.  Acute respiratory failure with hypoxia and hypercapnia:intubated during SEIZUREairway protection and extubated 3/21.  Alcohol abuse with some signs of withdrawal on 3/21 and was placed on low-dose. CIWA Ativan.He drank 6 beers between his previous discharge and admission 319.Continue vitamins/thiamine.  He has not needed any Ativan for last 2 days.  Right upper lobe nodule:Outpatient follow-up with pulmonary.  Anxiety/depression:Cont home meds BuSpar/trazodone for sleep.  Hypokalemia-repleted.  Hypotension-resolved.  Morbid obesity with patient's Body mass index is 32.1 kg/m: WILL Benefit with PCP follow-up and weight loss.  Discharge instruction has been clearly discussed with patient and patient's parents at the bedside.  He is unfortunately not able to anticoagulate at this time - which patient's and family understand and is  going to follow-up with neurology clinic in 3 weeks to discuss about resumption of anticoagulation.  They are aware about his upper and lower extremities DVT and PE.   They understand the difficult position that he is in currently no further inpatient hospitalization needed/no further intervention indicated.  I discussed with vascular surgery Dr Chestine Spore. Neuro has signed off.    Consults:  Neurology  pccm  Subjective: Alert awake oriented x3.  On room air.  Denies any symptoms.  He feels ready for home today. Discharge Exam: Vitals:   02/20/21 0338 02/20/21 0805  BP: 110/76 123/85  Pulse: 72 86  Resp: 20 18  Temp: 98.9 F (37.2 C) 98.1 F (36.7 C)  SpO2: 99% 99%   General: Pt is alert, awake, not in acute distress Cardiovascular: RRR, S1/S2 +, no rubs, no gallops Respiratory: CTA bilaterally, no wheezing, no rhonchi Abdominal: Soft, NT, ND, bowel sounds + Extremities: no edema, no cyanosis  Discharge Instructions  Discharge Instructions    Ambulatory referral to Neurology   Complete by: As directed    Follow up with Dr. [facility]lean [facility]ie at [facility] in 3 weeks. Pt needs to be seen within 3-4 weeks to repeat CT head and decide on the timing to restart strong blood thinner. Too complicated for NP to follow. Thanks for the help.   Ambulatory referral to Occupational Therapy   Complete by: As directed    Ambulatory referral to Physical Therapy   Complete by: As directed    Diet - low sodium heart healthy   Complete by: As directed    Discharge instructions   Complete by: As directed  You will need to follow-up with neurology in 3 weeks for follow-up on your intracranial hemorrhage and discussion resuming your blood thinner for her DVT PE.  Per [facility] statutes, patients with seizures are not allowed to drive until they have been seizure-free for six months. Use caution when using heavy equipment or power tools. Avoid working on ladders or at heights. Take showers instead of baths.  Ensure the water temperature is not too high on the home water heater. Do not go swimming alone. When caring for infants or small children, sit down when holding, feeding, or changing them to minimize risk of injury to the child in the event you have a seizure. Also, Maintain good sleep hygiene. Avoid alcohol.  Please call call MD or return to ER for similar or worsening recurring problem that brought you to hospital or if any fever,nausea/vomiting,abdominal pain, uncontrolled pain, chest pain,  shortness of breath or any other alarming symptoms.  Please follow-up your doctor as instructed in a week time and call the office for appointment.  Please avoid alcohol, smoking, or any other illicit substance and maintain healthy habits including taking your regular medications as prescribed.  You were cared for by a hospitalist during your hospital stay. If you have any questions about your discharge medications or the care you received while you were in the hospital after you are discharged, you can call the unit and ask to speak with the hospitalist on call if the hospitalist that took care of you is not available.  Once you are discharged, your primary care physician will handle any further medical issues. Please note that NO REFILLS for any discharge medications will be authorized once you are discharged, as it is imperative that you return to your primary care physician (or establish a relationship with a primary care physician if you do not have one) for your aftercare needs so that they can reassess your need for medications and monitor your lab values     Allergies as of 02/20/2021   No Known Allergies     Medication List    STOP taking these medications   apixaban 5 MG Tabs tablet Commonly known as: ELIQUIS   sertraline 100 MG tablet Commonly known as: ZOLOFT     TAKE these medications   busPIRone 10 MG tablet Commonly known as: BUSPAR Take 10 mg by mouth daily. OK to use lower dose  (1/2 tablet by mouth 3 times daily) if anxiety is not so bad.   docusate sodium 100 MG capsule Commonly known as: COLACE Take 1 capsule (100 mg total) by mouth 2 (two) times daily.   folic acid 1 MG tablet Commonly known as: FOLVITE Take 1 tablet (1 mg total) by mouth daily.   hydrOXYzine 25 MG capsule Commonly known as: VISTARIL Take 25-50 mg by mouth See admin instructions. Take 1 capsule (25mg ) by mouth twice daily as needed for anxiety and take 1-2 capsules (25-50mg ) by mouth at bedtime as needed for sleep.   levETIRAcetam 750 MG tablet Commonly known as: Keppra Take 2 tablets (1,500 mg total) by mouth 2 (two) times daily.   thiamine 100 MG tablet Take 1 tablet (100 mg total) by mouth daily.   traZODone 100 MG tablet Commonly known as: DESYREL Take 100 mg by mouth at bedtime as needed for sleep.       Follow-up Information    [facility] Follow [facility].   Specialty: Rehabilitation Why: The outpatient therapy will contact you for  the first appointment Contact information: [address] [id number] [place] [address] [phone]       Micki Riley, MD. Schedule an appointment as soon as possible for a visit in 3 week(s).   Specialties: Neurology, Radiology Contact information: [address] [place] [address] [phone]              No Known Allergies  The results of significant diagnostics from this hospitalization (including imaging, microbiology, ancillary and laboratory) are listed below for reference.    Microbiology: Recent Results (from the past 240 hour(s))  Resp Panel by RT-PCR (Flu A&B, Covid) Nasopharyngeal Swab     Status: None   Collection Time: 02/12/21 10:00 PM   Specimen: Nasopharyngeal Swab; Nasopharyngeal(NP) swabs in vial transport medium  Result Value Ref Range Status   SARS Coronavirus 2 by RT PCR NEGATIVE NEGATIVE Final    Comment:  (NOTE) SARS-CoV-2 target nucleic acids are NOT DETECTED.  The SARS-CoV-2 RNA is generally detectable in upper respiratory specimens during the acute phase of infection. The lowest concentration of SARS-CoV-2 viral copies this assay can detect is 138 copies/mL. A negative result does not preclude SARS-Cov-2 infection and should not be used as the sole basis for treatment or other patient management decisions. A negative result may occur with  improper specimen collection/handling, submission of specimen other than nasopharyngeal swab, presence of viral mutation(s) within the areas targeted by this assay, and inadequate number of viral copies(<138 copies/mL). A negative result must be combined with clinical observations, patient history, and epidemiological information. The expected result is Negative.  Fact Sheet for Patients:  BloggerCourse.com  Fact Sheet for Healthcare Providers:  SeriousBroker.it  This test is no t yet approved or cleared by the [place] FDA and  has been authorized for detection and/or diagnosis of SARS-CoV-2 by FDA under an Emergency Use Authorization (EUA). This EUA will remain  in effect (meaning this test can be used) for the duration of the COVID-19 declaration under Section 564(b)(1) of the Act, 21 U.S.C.section 360bbb-3(b)(1), unless the authorization is terminated  or revoked sooner.       Influenza A by PCR NEGATIVE NEGATIVE Final   Influenza B by PCR NEGATIVE NEGATIVE Final    Comment: (NOTE) The Xpert Xpress SARS-CoV-2/FLU/RSV plus assay is intended as an aid in the diagnosis of influenza from Nasopharyngeal swab specimens and should not be used as a sole basis for treatment. Nasal washings and aspirates are unacceptable for Xpert Xpress SARS-CoV-2/FLU/RSV testing.  Fact Sheet for Patients: BloggerCourse.com  Fact Sheet for Healthcare  Providers: SeriousBroker.it  This test is not yet approved or cleared by the [place] FDA and has been authorized for detection and/or diagnosis of SARS-CoV-2 by FDA under an Emergency Use Authorization (EUA). This EUA will remain in effect (meaning this test can be used) for the duration of the COVID-19 declaration under Section 564(b)(1) of the Act, 21 U.S.C. section 360bbb-3(b)(1), unless the authorization is terminated or revoked.  Performed at [facility], [address], [place], [place] [address]   MRSA PCR Screening     Status: None   Collection Time: 02/13/21  4:35 AM   Specimen: Nasal Mucosa; Nasopharyngeal  Result Value Ref Range Status   MRSA by PCR NEGATIVE NEGATIVE Final    Comment:        The GeneXpert MRSA Assay (FDA approved for NASAL specimens only), is one component of a comprehensive MRSA colonization surveillance program. It is not  intended to diagnose MRSA infection nor to guide or monitor treatment for MRSA infections. Performed at [facility], [address] [place], [place] [address]   Resp Panel by RT-PCR (Flu A&B, Covid) Nasopharyngeal Swab     Status: None   Collection Time: 02/15/21  3:36 PM   Specimen: Nasopharyngeal Swab; Nasopharyngeal(NP) swabs in vial transport medium  Result Value Ref Range Status   SARS Coronavirus 2 by RT PCR NEGATIVE NEGATIVE Final    Comment: (NOTE) SARS-CoV-2 target nucleic acids are NOT DETECTED.  The SARS-CoV-2 RNA is generally detectable in upper respiratory specimens during the acute phase of infection. The lowest concentration of SARS-CoV-2 viral copies this assay can detect is 138 copies/mL. A negative result does not preclude SARS-Cov-2 infection and should not be used as the sole basis for treatment or other patient management decisions. A negative result may occur with  improper specimen collection/handling, submission of specimen other than  nasopharyngeal swab, presence of viral mutation(s) within the areas targeted by this assay, and inadequate number of viral copies(<138 copies/mL). A negative result must be combined with clinical observations, patient history, and epidemiological information. The expected result is Negative.  Fact Sheet for Patients:  BloggerCourse.com  Fact Sheet for Healthcare Providers:  SeriousBroker.it  This test is no t yet approved or cleared by the [place] FDA and  has been authorized for detection and/or diagnosis of SARS-CoV-2 by FDA under an Emergency Use Authorization (EUA). This EUA will remain  in effect (meaning this test can be used) for the duration of the COVID-19 declaration under Section 564(b)(1) of the Act, 21 U.S.C.section 360bbb-3(b)(1), unless the authorization is terminated  or revoked sooner.       Influenza A by PCR NEGATIVE NEGATIVE Final   Influenza B by PCR NEGATIVE NEGATIVE Final    Comment: (NOTE) The Xpert Xpress SARS-CoV-2/FLU/RSV plus assay is intended as an aid in the diagnosis of influenza from Nasopharyngeal swab specimens and should not be used as a sole basis for treatment. Nasal washings and aspirates are unacceptable for Xpert Xpress SARS-CoV-2/FLU/RSV testing.  Fact Sheet for Patients: BloggerCourse.com  Fact Sheet for Healthcare Providers: SeriousBroker.it  This test is not yet approved or cleared by the [place] FDA and has been authorized for detection and/or diagnosis of SARS-CoV-2 by FDA under an Emergency Use Authorization (EUA). This EUA will remain in effect (meaning this test can be used) for the duration of the COVID-19 declaration under Section 564(b)(1) of the Act, 21 U.S.C. section 360bbb-3(b)(1), unless the authorization is terminated or revoked.  Performed at [facility], [address], [place], [place] [address]      Procedures/Studies: CT HEAD WO CONTRAST  Result Date: 02/15/2021 CLINICAL DATA:  Parenchymal hemorrhage, follow-up EXAM: CT HEAD WITHOUT CONTRAST TECHNIQUE: Contiguous axial images were obtained from the base of the skull through the vertex without intravenous contrast. COMPARISON:  Earlier today FINDINGS: Brain: Right lateral cerebellar hemorrhage measures 2.4 x 1.8 cm. Right temporal lobe hemorrhage measures 3.3 x 2.7 cm. 6 mm area of hemorrhage in the right temporal lobe adjacent to the temporal horn of the lateral ventricle. These are unchanged since prior study. No significant right to left midline shift. No hydrocephalus. No new areas of hemorrhage. Vascular: No hyperdense vessel or unexpected calcification. Skull: No acute calvarial abnormality. Sinuses/Orbits: No acute findings Other: None IMPRESSION: Stable areas of hemorrhage in the right temporal lobe and right cerebellum. No new areas of hemorrhage. Electronically Signed   By:  Charlett Nose M.D.   On: 02/15/2021 21:33   CT Head Wo Contrast  Result Date: 02/13/2021 CLINICAL DATA:  Initial evaluation for acute trauma, fall. EXAM: CT HEAD WITHOUT CONTRAST TECHNIQUE: Contiguous axial images were obtained from the base of the skull through the vertex without intravenous contrast. COMPARISON:  None. FINDINGS: Brain: Cerebral volume within normal limits. No acute intracranial hemorrhage. No acute large vessel territory infarct. No mass lesion, midline shift or mass effect. No hydrocephalus or extra-axial fluid collection. Vascular: No hyperdense vessel. Skull: Small soft tissue contusion present at the right parieto-occipital scalp. Calvarium intact. Sinuses/Orbits: Globes and orbital soft tissues within normal limits. Visualized paranasal sinuses are clear. No mastoid effusion. Other: None. IMPRESSION: 1. No acute intracranial abnormality. 2. Small right parieto-occipital scalp contusion. No calvarial fracture. Electronically Signed   By: Rise Mu M.D.   On: 02/13/2021 00:26   CT CHEST W CONTRAST  Result Date: 02/15/2021 CLINICAL DATA:  Fall 3 days ago. EXAM: CT CHEST, ABDOMEN, AND PELVIS WITH CONTRAST TECHNIQUE: Multidetector CT imaging of the chest, abdomen and pelvis was performed following the standard protocol during bolus administration of intravenous contrast. CONTRAST:  OMNIPAQUE IOHEXOL 300 MG/ML  SOLN COMPARISON:  CT angio chest 02/12/2021 FINDINGS: CHEST: Ports and Devices: The endotracheal tube terminates 4 cm above the carina. The enteric tube courses below the hemidiaphragm with tip and side port within the gastric lumen. Lungs/airways: Interval increase in left lower lobe hypodense consolidation. Linear atelectasis and subsegmental atelectasis of the right upper lobe. There is a 4 mm nodule within the right upper lobe. No pulmonary mass. No pulmonary contusion or laceration. No pneumatocele formation. The central airways are patent. Pleura: No pleural effusion. No pneumothorax. No hemothorax. Lymph Nodes: No mediastinal, hilar, or axillary lymphadenopathy. Mediastinum: No pneumomediastinum. No aortic injury or mediastinal hematoma. The thoracic aorta is normal in caliber. Improved but persistent increased right to left ventricular ratio. The heart is otherwise normal in size. No longer paradoxical bowing of the interventricular septum however still noted to be straightening. No significant pericardial effusion. Slightly improved but persistently extensive bilateral central pulmonary emboli that extend to all pulmonary lobes. The esophagus is unremarkable. The thyroid is unremarkable. Chest Wall / Breasts: Marked bilateral gynecomastia that appears to be asymmetric, left greater than right. Musculoskeletal: No acute displaced costochondral junction fracture. No acute displaced rib or sternal fracture. No spinal fracture. ABDOMEN / PELVIS: Liver: Not enlarged. No focal lesion. No laceration or subcapsular hematoma. Biliary  System: The gallbladder is otherwise unremarkable with no radio-opaque gallstones. No biliary ductal dilatation. Pancreas: Normal pancreatic contour. No main pancreatic duct dilatation. Spleen: Not enlarged. No focal lesion. No laceration, subcapsular hematoma, or vascular injury. Adrenal Glands: No nodularity bilaterally. Kidneys: Bilateral kidneys enhance symmetrically. No hydronephrosis. No contusion, laceration, or subcapsular hematoma. No injury to the vascular structures or collecting systems. No hydroureter. The urinary bladder is decompressed with a urinary Foley catheter terminating within the urinary bladder lumen. Bowel: No small or large bowel wall thickening or dilatation. Mesentery, Omentum, and Peritoneum: No simple free fluid ascites. No pneumoperitoneum. No hemoperitoneum. No mesenteric hematoma identified. No organized fluid collection. Pelvic Organs: Normal. Lymph Nodes: No abdominal, pelvic, inguinal lymphadenopathy. Vasculature: No abdominal aorta or iliac aneurysm. No active contrast extravasation or pseudoaneurysm. Musculoskeletal: Soft tissue edema of bilateral/flanks with small hematoma formation along the right flank at the level of the gluteal soft tissues (3:104). No acute pelvic fracture. No spinal fracture. IMPRESSION: 1. Slightly improved but persistently extensive bilateral  central pulmonary emboli that extend to all pulmonary lobes. Persistent but improved secondary findings of right heart strain. 2. Interval increase in left lower lobe hypodense consolidation likely representing infection/inflammation. 3. Marked bilateral gynecomastia that appears to be asymmetric, left greater than right. Given asymmetry and patient age/gender, recommend correlation with mammography to exclude an underlying lesion. 4. Indeterminate 4 mm right upper lobe nodule. 5. No acute traumatic injury to the chest, abdomen, or pelvis. 6. No acute fracture or traumatic malalignment of the thoracic or lumbar  spine. 7. Lines and tubes in appropriate position. Electronically Signed   By: Tish Frederickson M.D.   On: 02/15/2021 21:40   CT Angio Chest PE W and/or Wo Contrast  Result Date: 02/13/2021 CLINICAL DATA:  Initial evaluation for acute syncope, concern for PE. EXAM: CT ANGIOGRAPHY CHEST WITH CONTRAST TECHNIQUE: Multidetector CT imaging of the chest was performed using the standard protocol during bolus administration of intravenous contrast. Multiplanar CT image reconstructions and MIPs were obtained to evaluate the vascular anatomy. CONTRAST:  75mL OMNIPAQUE IOHEXOL 350 MG/ML SOLN COMPARISON:  None. FINDINGS: Cardiovascular: Intrathoracic aorta normal in caliber without acute abnormality. Visualized great vessels within normal limits. Heart size mildly enlarged.  No pericardial effusion. Pulmonary arterial tree adequately opacified for evaluation. Extensive acute pulmonary emboli seen within the bilateral mainstem and proximal lobar pulmonary arteries, with involvement of all lobes. No frank saddle pulmonary embolus. Main pulmonary artery mildly dilated up to 3.1 cm. Elevated RV to LV ratio of approximately 2.0 with mild right-to-left bowing of the intraventricular septum, consistent with right heart strain. Reflux of contrast material into the IVC and hepatic veins also consistent with right heart strain. Probable filling defect within the visualized IVC, concerning for thrombus (series 5, image 123). Inferiorly, the partially visualized IVC is enlarged/dilated with associated stranding, also suspicious for acute thrombosis (series 5, image 154). Mediastinum/Nodes: Thyroid within normal limits. No pathologically enlarged mediastinal, hilar, or axillary lymph nodes. Esophagus within normal limits. Lungs/Pleura: Tracheobronchial tree intact and patent. Small layering left pleural effusion. Associated left basilar consolidative opacity could reflect atelectasis or possibly pulmonary infarct. Mild atelectatic  changes noted along the right minor fissure and lingula as well. Lungs are otherwise clear. No pulmonary edema. No pneumothorax. No worrisome pulmonary nodule or mass. Upper Abdomen: Visualized upper abdomen demonstrates no other acute finding. Musculoskeletal: Probable 1.4 cm sebaceous cyst noted within the subcutaneous fat of the lower mid back (series 5, image 145). Visualized external soft tissues demonstrate no other acute finding. No acute osseous finding. No discrete or worrisome osseous lesions. Review of the MIP images confirms the above findings. IMPRESSION: 1. Extensive acute pulmonary emboli involving the bilateral mainstem and proximal lobar pulmonary arteries. Evidence of right heart strain (RV/LV Ratio 2.0) consistent with at least submassive (intermediate risk) PE. The presence of right heart strain has been associated with an increased risk of morbidity and mortality. 2. Probable filling defect within the visualized IVC, concerning for thrombus. 3. Small layering left pleural effusion with associated left basilar consolidative opacity, which could reflect atelectasis and/or pulmonary infarct. Critical Value/emergent results were discussed by telephone at the time of interpretation on 02/12/2021 at 11:42 pm to provider [facility] , who verbally acknowledged these results. Electronically Signed   By: Rise Mu M.D.   On: 02/13/2021 00:18   CT CERVICAL SPINE WO CONTRAST  Result Date: 02/15/2021 CLINICAL DATA:  Neck trauma EXAM: CT CERVICAL SPINE WITHOUT CONTRAST TECHNIQUE: Multidetector CT imaging of the cervical spine was performed without intravenous  contrast. Multiplanar CT image reconstructions were also generated. COMPARISON:  None. FINDINGS: Alignment: Normal Skull base and vertebrae: No acute fracture. No primary bone lesion or focal pathologic process. Soft tissues and spinal canal: No prevertebral fluid or swelling. No visible canal hematoma. Disc levels: Degenerative  disc disease from C5-6 through C6-7. Mild bilateral degenerative facet disease. Upper chest: Airspace disease in the right upper lobe with right pleural effusion partially imaged. Other: Endotracheal tube and NG tube are noted in place. IMPRESSION: No acute bony abnormality in the cervical spine. Cervical spondylosis. Right pleural effusion and right upper lobe airspace disease partially imaged peer Electronically Signed   By: Charlett Nose M.D.   On: 02/15/2021 21:28   MR ANGIO HEAD WO CONTRAST  Result Date: 02/16/2021 CLINICAL DATA:  Ventilator supported patient. Follow-up intracranial hemorrhage. EXAM: MRI HEAD WITHOUT AND WITH CONTRAST MRA HEAD WITHOUT CONTRAST TECHNIQUE: Multiplanar, multiecho pulse sequences of the brain and surrounding structures were obtained without and with intravenous contrast. Angiographic images of the head were obtained using MRA technique without contrast. CONTRAST:  9.43mL GADAVIST GADOBUTROL 1 MMOL/ML IV SOLN COMPARISON:  Head CT yesterday FINDINGS: MRI HEAD FINDINGS Brain: Intraparenchymal hematoma of the lateral right cerebellum measuring 2 cm in diameter with mild surrounding edema. Intraparenchymal hematoma in the right lateral temporal lobe measuring 3 cm in diameter with surrounding edema. Intraparenchymal hematoma in the more anterior right temporal lobe just lateral to the temporal horn of the lateral ventricle measuring 6-7 mm in diameter with minimal edema. No new intracranial abnormality. No subdural hematoma. No sign of subarachnoid blood. Mild edema the right parieto-occipital scalp is further evidence of head trauma as the etiology. No mass effect or midline shift. No hydrocephalus. No abnormal contrast enhancement associated with the regions of hemorrhage. Vascular: Major vessels at the base of the brain show flow. Skull and upper cervical spine: Otherwise negative Sinuses/Orbits: Clear/normal Other: No fluid in the middle ears or mastoid air cells. MRA HEAD  FINDINGS Both internal carotid arteries are widely patent into the brain. No siphon stenosis. The anterior and middle cerebral vessels are patent without proximal stenosis, aneurysm or vascular malformation. Fetal origin of both posterior cerebral arteries. Both vertebral arteries are widely patent to the basilar. No basilar stenosis. Posterior circulation branch vessels appear normal. The posterior circulation is diminutive because of the fetal origin of both posterior cerebral arteries. IMPRESSION: 1. Three hematomas, 1 in the lateral right cerebellum and 2 in the right lateral temporal lobe with surrounding edema. Mild swelling but no mass effect or midline shift. No evidence of enlargement. No new intracranial hemorrhage. No abnormal contrast enhancement. 2. Mild edema in the right parieto-occipital scalp consistent with head trauma. 3. Negative intracranial MR angiography of the large and medium size vessels. No aneurysm or vascular malformation. Fetal origin of both posterior cerebral arteries with diminutive posterior circulation due to that. Electronically Signed   By: Paulina Fusi M.D.   On: 02/16/2021 14:08   MR BRAIN W WO CONTRAST  Result Date: 02/16/2021 CLINICAL DATA:  Ventilator supported patient. Follow-up intracranial hemorrhage. EXAM: MRI HEAD WITHOUT AND WITH CONTRAST MRA HEAD WITHOUT CONTRAST TECHNIQUE: Multiplanar, multiecho pulse sequences of the brain and surrounding structures were obtained without and with intravenous contrast. Angiographic images of the head were obtained using MRA technique without contrast. CONTRAST:  9.35mL GADAVIST GADOBUTROL 1 MMOL/ML IV SOLN COMPARISON:  Head CT yesterday FINDINGS: MRI HEAD FINDINGS Brain: Intraparenchymal hematoma of the lateral right cerebellum measuring 2 cm in diameter with mild  surrounding edema. Intraparenchymal hematoma in the right lateral temporal lobe measuring 3 cm in diameter with surrounding edema. Intraparenchymal hematoma in the more  anterior right temporal lobe just lateral to the temporal horn of the lateral ventricle measuring 6-7 mm in diameter with minimal edema. No new intracranial abnormality. No subdural hematoma. No sign of subarachnoid blood. Mild edema the right parieto-occipital scalp is further evidence of head trauma as the etiology. No mass effect or midline shift. No hydrocephalus. No abnormal contrast enhancement associated with the regions of hemorrhage. Vascular: Major vessels at the base of the brain show flow. Skull and upper cervical spine: Otherwise negative Sinuses/Orbits: Clear/normal Other: No fluid in the middle ears or mastoid air cells. MRA HEAD FINDINGS Both internal carotid arteries are widely patent into the brain. No siphon stenosis. The anterior and middle cerebral vessels are patent without proximal stenosis, aneurysm or vascular malformation. Fetal origin of both posterior cerebral arteries. Both vertebral arteries are widely patent to the basilar. No basilar stenosis. Posterior circulation branch vessels appear normal. The posterior circulation is diminutive because of the fetal origin of both posterior cerebral arteries. IMPRESSION: 1. Three hematomas, 1 in the lateral right cerebellum and 2 in the right lateral temporal lobe with surrounding edema. Mild swelling but no mass effect or midline shift. No evidence of enlargement. No new intracranial hemorrhage. No abnormal contrast enhancement. 2. Mild edema in the right parieto-occipital scalp consistent with head trauma. 3. Negative intracranial MR angiography of the large and medium size vessels. No aneurysm or vascular malformation. Fetal origin of both posterior cerebral arteries with diminutive posterior circulation due to that. Electronically Signed   By: Paulina Fusi M.D.   On: 02/16/2021 14:08   CT ABDOMEN PELVIS W CONTRAST  Result Date: 02/15/2021 CLINICAL DATA:  Fall 3 days ago. EXAM: CT CHEST, ABDOMEN, AND PELVIS WITH CONTRAST TECHNIQUE:  Multidetector CT imaging of the chest, abdomen and pelvis was performed following the standard protocol during bolus administration of intravenous contrast. CONTRAST:  OMNIPAQUE IOHEXOL 300 MG/ML  SOLN COMPARISON:  CT angio chest 02/12/2021 FINDINGS: CHEST: Ports and Devices: The endotracheal tube terminates 4 cm above the carina. The enteric tube courses below the hemidiaphragm with tip and side port within the gastric lumen. Lungs/airways: Interval increase in left lower lobe hypodense consolidation. Linear atelectasis and subsegmental atelectasis of the right upper lobe. There is a 4 mm nodule within the right upper lobe. No pulmonary mass. No pulmonary contusion or laceration. No pneumatocele formation. The central airways are patent. Pleura: No pleural effusion. No pneumothorax. No hemothorax. Lymph Nodes: No mediastinal, hilar, or axillary lymphadenopathy. Mediastinum: No pneumomediastinum. No aortic injury or mediastinal hematoma. The thoracic aorta is normal in caliber. Improved but persistent increased right to left ventricular ratio. The heart is otherwise normal in size. No longer paradoxical bowing of the interventricular septum however still noted to be straightening. No significant pericardial effusion. Slightly improved but persistently extensive bilateral central pulmonary emboli that extend to all pulmonary lobes. The esophagus is unremarkable. The thyroid is unremarkable. Chest Wall / Breasts: Marked bilateral gynecomastia that appears to be asymmetric, left greater than right. Musculoskeletal: No acute displaced costochondral junction fracture. No acute displaced rib or sternal fracture. No spinal fracture. ABDOMEN / PELVIS: Liver: Not enlarged. No focal lesion. No laceration or subcapsular hematoma. Biliary System: The gallbladder is otherwise unremarkable with no radio-opaque gallstones. No biliary ductal dilatation. Pancreas: Normal pancreatic contour. No main pancreatic duct dilatation.  Spleen: Not enlarged. No focal lesion. No  laceration, subcapsular hematoma, or vascular injury. Adrenal Glands: No nodularity bilaterally. Kidneys: Bilateral kidneys enhance symmetrically. No hydronephrosis. No contusion, laceration, or subcapsular hematoma. No injury to the vascular structures or collecting systems. No hydroureter. The urinary bladder is decompressed with a urinary Foley catheter terminating within the urinary bladder lumen. Bowel: No small or large bowel wall thickening or dilatation. Mesentery, Omentum, and Peritoneum: No simple free fluid ascites. No pneumoperitoneum. No hemoperitoneum. No mesenteric hematoma identified. No organized fluid collection. Pelvic Organs: Normal. Lymph Nodes: No abdominal, pelvic, inguinal lymphadenopathy. Vasculature: No abdominal aorta or iliac aneurysm. No active contrast extravasation or pseudoaneurysm. Musculoskeletal: Soft tissue edema of bilateral/flanks with small hematoma formation along the right flank at the level of the gluteal soft tissues (3:104). No acute pelvic fracture. No spinal fracture. IMPRESSION: 1. Slightly improved but persistently extensive bilateral central pulmonary emboli that extend to all pulmonary lobes. Persistent but improved secondary findings of right heart strain. 2. Interval increase in left lower lobe hypodense consolidation likely representing infection/inflammation. 3. Marked bilateral gynecomastia that appears to be asymmetric, left greater than right. Given asymmetry and patient age/gender, recommend correlation with mammography to exclude an underlying lesion. 4. Indeterminate 4 mm right upper lobe nodule. 5. No acute traumatic injury to the chest, abdomen, or pelvis. 6. No acute fracture or traumatic malalignment of the thoracic or lumbar spine. 7. Lines and tubes in appropriate position. Electronically Signed   By: Tish Frederickson M.D.   On: 02/15/2021 21:40   IR IVC FILTER PLMT / S&I Lenise Arena GUID/MOD SED  Result Date:  02/16/2021 CLINICAL DATA:  History of pulmonary embolism and left lower extremity DVT, new intracranial hemorrhage. Caval filtration requested. Cervical collar. EXAM: INFERIOR VENACAVOGRAM IVC FILTER PLACEMENT UNDER FLUOROSCOPY FLUOROSCOPY TIME:  5 minutes 6 seconds; 52 mGy TECHNIQUE: A right femoral approach was selected. Patency of the right common femoral vein was confirmed with ultrasound with image documentation. An appropriate skin site was determined. Skin site was marked, prepped with chlorhexidine, and draped using maximum barrier technique. The region was infiltrated locally with 1% lidocaine. Under real-time ultrasound guidance, the right common femoral vein was accessed with a 21 gauge micropuncture needle; the needle tip within the vein was confirmed with ultrasound image documentation. The needle was exchanged over a 018 guidewire for a transitional dilator, which allow advancement of the [facility] wire into the IVC. A long 6 French vascular sheath was placed for inferior venacavography. This demonstrated no caval thrombus. 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 Accordingly, Patency of the right IJ vein was confirmed with ultrasound with image documentation. An appropriate skin site was determined. Skin site was marked, prepped with chlorhexidine, and draped using maximum barrier technique. The region was infiltrated locally with 1% lidocaine. Under real-time ultrasound guidance, the right IJ vein was accessed with a 21 gauge micropuncture needle; the needle tip within the vein was confirmed with ultrasound image documentation. The needle was exchanged over a 018 guidewire for a transitional dilator, which allow advancement of the [facility] wire into the IVC. A long 6 French vascular sheath was placed to allow deployment of the Celect filter at the L2-3 level. Due to significant filter tilt with  tip apposition to the lateral wall of the IVC, the delivery sheath was exchanged for a filter retrieval sheath in the snare device was used to recapture the filter and subsequently to redeployed slightly more inferiorly in  the IVC at the level of the L3-4 interspace with improved angulation. Followup cavagram demonstrates stable filter position and no evident complication. The sheath was removed and hemostasis achieved at the site. No immediate complication. IMPRESSION: 1. Mega cava.  No thrombus. 2. Technically successful infrarenal IVC filter placement. This is a retrievable model. PLAN: This IVC filter is potentially retrievable. The patient will be assessed for filter retrieval by Interventional Radiology in approximately 8-12 weeks. Further recommendations regarding filter retrieval, continued surveillance or declaration of device permanence, will be made at that time. Electronically Signed   By: Corlis Leak M.D.   On: 02/16/2021 13:56   DG Chest Port 1 View  Result Date: 02/16/2021 CLINICAL DATA:  Stroke. EXAM: PORTABLE CHEST 1 VIEW COMPARISON:  February 15, 2021 FINDINGS: Endotracheal tube and nasogastric tube are unchanged. Endotracheal tube is identified distal tip 6.6 cm from carina. The mediastinal contour and cardiac silhouette are stable. Patchy opacity of the left lung base is noted. There is probably a small left pleural effusion. There is no pulmonary edema. Mild atelectasis of the right upper lobe is noted. Osseous structures are stable. IMPRESSION: Patchy opacity of left lung base, pneumonia is not excluded. Previously noted bilateral upper lobe airspace opacities have resolved, minimal residual atelectasis is identified in the right upper lobe. Electronically Signed   By: Sherian Rein M.D.   On: 02/16/2021 09:23   DG Chest Portable 1 View  Result Date: 02/15/2021 CLINICAL DATA:  ET tube placement EXAM: PORTABLE CHEST 1 VIEW COMPARISON:  None. FINDINGS: Endotracheal tube is 4 cm above the  carina. NG tube enters the stomach. Cardiomegaly, vascular congestion. Bilateral upper lobe airspace opacities, new since prior CT. No visible effusions or pneumothorax. No acute bony abnormality. IMPRESSION: ET tube approximately 4.4 cm above the carina. Cardiomegaly, vascular congestion. New bilateral upper lobe airspace opacities. Electronically Signed   By: Charlett Nose M.D.   On: 02/15/2021 16:53   DG Shoulder Right Port  Result Date: 02/13/2021 CLINICAL DATA:  Syncope, fall, right shoulder pain EXAM: PORTABLE RIGHT SHOULDER COMPARISON:  None. FINDINGS: No glenohumeral dislocation. No evidence of acromioclavicular separation. Mild acromioclavicular joint osteoarthritis. No significant glenohumeral joint arthropathy. No fracture. No focal osseous lesions. No radiopaque foreign bodies or pathologic soft tissue calcifications. IMPRESSION: No right shoulder fracture or malalignment. Mild right AC joint osteoarthritis. Electronically Signed   By: Delbert Phenix M.D.   On: 02/13/2021 14:30   EEG adult  Result Date: 02/17/2021 Lilian Coma, MD     02/17/2021 12:42 PM EEG Report Indication: possible seizure activity witnessed vs syncope--recent fall/contusion  This study was recorded in the waking/drowsy state.  The duration of the study was 23 minutes.  Electrodes were placed according to the International 10/20 system.  Video was reviewed for clinical correlation as needed.  In the waking state, some degree of background organization is seen with an attenuated but perceptible anterior - posterior voltage and frequency gradient.  In the occipital leads there is a symmetric and reactive posterior dominant rhythm of approximately 8-9 hertz, which is less distinct/defined compared to normal.  Anteriorly, is the expected pattern of faster frequency, lower voltage waveforms though in a mildly reduced amount as compared to normal, and particularly with increased delta slowing with overriding fast frequencies  which is seen throughout. There is a clear and persistent region of diminished fast frequencies in the right temporal>central region.  There are also sharp waves in the right temporal region, with a voltage maximum at lead  T8.  These are seen frequently during the study, but always in isolation and without clear clustering.  There is no evident drowsy organization, although there are periods--likely corresponding to drowsiness--in which there is some slight drop out of the faster frequencies, attenuation and of the gradient and disappearance of the posterior dominant rhythm. Hyperventilation: deferred Photic stimulation: unremarkable  Impression:  This is an abnormal waking and drowsy study due to 1) attenuated organization as described above--clinically most consistent with a mild diffuse encephalopathy, 2) persistent slowing in the right temporal>central region, consistent with focal neuronal dysfunction and 3) suspected discharges intermittently/frequently seen in the right temporal region consistent with an epileptiform foci, although without clustering/always in isolation, and without seizure activity at any point.    Overnight EEG with video  Result Date: 02/16/2021 Lind Guest, MD     02/16/2021  8:20 AM EEG Procedure CPT/Type of Study: 95720; 24hr EEG with video Referring Provider: Gaynell Face Primary Neurological Diagnosis: ICH History: This is a 50 yr old patient, undergoing an EEG to evaluate for ICH, possible seizure. Clinical State: obtunded Technical Description: The EEG was performed using standard setting per the guidelines of American Clinical Neurophysiology Society (ACNS). A minimum of 21 electrodes were placed on scalp according to the International 10-20 or/and 10-10 Systems. Supplemental electrodes were placed as needed. Single EKG electrode was also used to detect cardiac arrhythmia. Patient's behavior was continuously recorded on video simultaneously with EEG. A minimum of 16 channels  were used for data display. Each epoch of study was reviewed manually daily and as needed using standard referential and bipolar montages. Computerized quantitative EEG analysis (such as compressed spectral array analysis, trending, automated spike & seizure detection) were used as indicated. Day 1: from 1937 02/15/21 to 0740 02/16/21 EEG Description: Overall Amplitude:Normal Predominant Frequency: The background activity showed theta-delta activity without a posterior dominant rhythm, primarily a sedated background. Superimposed Frequencies: sparse beta activity bilaterally The background was asymmetric with increased focal slowing and loss of detail over the right temporal region. Background Abnormalities: Focal slowing; right temporal focal slowing and loss of detail Rhythmic or periodic pattern: Yes; lateralized periodic discharges: right temporal 1hz  sharp LPDs in runs but without ictal spread, occurring occasionally Epileptiform activity: Yes; sharp morphology to right temporal LPDs Electrographic seizures: no Events: no Breach rhythm: no Reactivity: Present Stimulation procedures: Hyperventilation: not done Photic stimulation: not done Sleep Background: Stage II EKG:no significant arrhythmia Impression: This was an abnormal continuous video EEG due to a slow, sedated background with focal slowing and LPDs in the right temporal region, 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 No seizures were seen.   ECHOCARDIOGRAM COMPLETE  Result Date: 02/13/2021    ECHOCARDIOGRAM REPORT   Patient Name:   Danny Lowe Date of Exam: 02/13/2021 Medical Rec #:  [id number]         Height:       69.0 in Accession #:    [id number]        Weight:       205.0 lb Date of Birth:  [birth year]        BSA:          2.088 m Patient Age:    49 years          BP:           111/82 mmHg Patient Gender: M                 HR:  82 bpm. Exam Location:  Inpatient Procedure: 2D Echo, 3D  Echo, Cardiac Doppler, Color Doppler and Strain Analysis Indications:    Pulmonary Embolus  History:        Patient has no prior history of Echocardiogram examinations.  Sonographer:    Neomia Dear RDCS Referring Phys: [id number] Ivor Costa MEIER IMPRESSIONS  1. Left ventricular ejection fraction, by estimation, is 60 to 65%. The left ventricle has normal function. The left ventricle has no regional wall motion abnormalities. Left ventricular diastolic parameters are indeterminate. There is the interventricular septum is flattened in diastole ('D' shaped left ventricle), consistent with right ventricular volume overload.  2. The RV apex is not seen very well but in some views, the apex can be seen to contract fairly well with severe dilatation and hypocontractility of the mid-basal RV ( McConnells sign). This is consistent with RV strain from pulmonary embolus. . . Right  ventricular systolic function is moderately reduced. The right ventricular size is moderately enlarged. There is mildly elevated pulmonary artery systolic pressure. The estimated right ventricular systolic pressure is 38.9 mmHg.  3. Right atrial size was mildly dilated.  4. The mitral valve is normal in structure. Trivial mitral valve regurgitation.  5. The aortic valve is normal in structure. Aortic valve regurgitation is not visualized. No aortic stenosis is present. FINDINGS  Left Ventricle: Left ventricular ejection fraction, by estimation, is 60 to 65%. The left ventricle has normal function. The left ventricle has no regional wall motion abnormalities. The left ventricular internal cavity size was normal in size. There is  no left ventricular hypertrophy. The interventricular septum is flattened in diastole ('D' shaped left ventricle), consistent with right ventricular volume overload. Left ventricular diastolic parameters are indeterminate. Right Ventricle: The RV apex is not seen very well but in some views, the apex can be seen to contract  fairly well with severe dilatation and hypocontractility of the mid-basal RV ( McConnells sign). This is consistent with RV strain from pulmonary embolus. The right ventricular size is moderately enlarged. Right vetricular wall thickness was not well visualized. Right ventricular systolic function is moderately reduced. There is mildly elevated pulmonary artery systolic pressure. The tricuspid regurgitant velocity is 2.78 m/s, and with an assumed right atrial pressure of 8 mmHg, the estimated right ventricular systolic pressure is 38.9 mmHg. Left Atrium: Left atrial size was normal in size. Right Atrium: Right atrial size was mildly dilated. Pericardium: There is no evidence of pericardial effusion. Mitral Valve: The mitral valve is normal in structure. Trivial mitral valve regurgitation. MV peak gradient, 2.4 mmHg. The mean mitral valve gradient is 1.0 mmHg. Tricuspid Valve: The tricuspid valve is grossly normal. Tricuspid valve regurgitation is trivial. Aortic Valve: The aortic valve is normal in structure. Aortic valve regurgitation is not visualized. No aortic stenosis is present. Aortic valve mean gradient measures 3.0 mmHg. Aortic valve peak gradient measures 4.8 mmHg. Aortic valve area, by VTI measures 3.24 cm. Pulmonic Valve: The pulmonic valve was normal in structure. Pulmonic valve regurgitation is not visualized. Aorta: The aortic root and ascending aorta are structurally normal, with no evidence of dilitation. IAS/Shunts: The atrial septum is grossly normal.  LEFT VENTRICLE PLAX 2D LVIDd:         3.70 cm      Diastology LVIDs:         2.60 cm      LV e' medial:    5.11 cm/s LV PW:         1.40 cm  LV E/e' medial:  11.6 LV IVS:        2.00 cm      LV e' lateral:   5.33 cm/s LVOT diam:     2.40 cm      LV E/e' lateral: 11.1 LV SV:         54 LV SV Index:   26 LVOT Area:     4.52 cm  LV Volumes (MOD) LV vol d, MOD A2C: 84.0 ml LV vol d, MOD A4C: 112.0 ml LV vol s, MOD A2C: 34.6 ml LV vol s, MOD A4C:  36.2 ml LV SV MOD A2C:     49.4 ml LV SV MOD A4C:     112.0 ml LV SV MOD BP:      62.8 ml RIGHT VENTRICLE RV S prime:     10.90 cm/s  PULMONARY VEINS TAPSE (M-mode): 1.2 cm      A Reversal Duration: 79.00 msec                             A Reversal Velocity: 20.40 cm/s                             Diastolic Velocity:  33.70 cm/s                             S/D Velocity:        1.30                             Systolic Velocity:   43.40 cm/s LEFT ATRIUM             Index       RIGHT ATRIUM           Index LA diam:        2.40 cm 1.15 cm/m  RA Area:     20.60 cm LA Vol (A2C):   20.5 ml 9.82 ml/m  RA Volume:   65.90 ml  31.56 ml/m LA Vol (A4C):   41.1 ml 19.68 ml/m LA Biplane Vol: 30.7 ml 14.70 ml/m  AORTIC VALVE                   PULMONIC VALVE AV Area (Vmax):    3.62 cm    PV Vmax:       0.65 m/s AV Area (Vmean):   3.58 cm    PV Peak grad:  1.7 mmHg AV Area (VTI):     3.24 cm AV Vmax:           110.00 cm/s AV Vmean:          77.700 cm/s AV VTI:            0.166 m AV Peak Grad:      4.8 mmHg AV Mean Grad:      3.0 mmHg LVOT Vmax:         87.90 cm/s LVOT Vmean:        61.500 cm/s LVOT VTI:          0.119 m LVOT/AV VTI ratio: 0.72  AORTA Ao Root diam: 3.30 cm Ao Asc diam:  2.90 cm MITRAL VALVE               TRICUSPID VALVE MV Area (PHT): 4.57 cm  TR Peak grad:   30.9 mmHg MV Area VTI:   3.22 cm    TR Vmax:        278.00 cm/s MV Peak grad:  2.4 mmHg MV Mean grad:  1.0 mmHg    SHUNTS MV Vmax:       0.77 m/s    Systemic VTI:  0.12 m MV Vmean:      48.3 cm/s   Systemic Diam: 2.40 cm MV Decel Time: 166 msec MV E velocity: 59.10 cm/s MV A velocity: 69.80 cm/s MV E/A ratio:  0.85 Kristeen Miss MD Electronically signed by Kristeen Miss MD Signature Date/Time: 02/13/2021/3:21:59 PM    Final    CT HEAD CODE STROKE WO CONTRAST  Result Date: 02/15/2021 CLINICAL DATA:  Code stroke. Acute presentation with left-sided weakness. By previous history, there was a fall with head trauma 3 days ago. EXAM: CT HEAD WITHOUT  CONTRAST TECHNIQUE: Contiguous axial images were obtained from the base of the skull through the vertex without intravenous contrast. COMPARISON:  02/12/2021 FINDINGS: Brain: Right lateral cerebellar intraparenchymal hemorrhage measuring 2.8 x 1.8 x 1.5 cm (volume = 4 cm^3). Mild surrounding edema. No other posterior fossa abnormality. 6 mm intraparenchymal hemorrhage adjacent to the lateral margin of the right temporal tip. 3.2 x 2.8 x 2.1 cm (volume = 9.9 cm^3) hematoma in the right temporal lobe just above the temporal bone with surrounding edema. Minor mass effect. Right-to-left shift of 1 or 2 mm. No sign of acute ischemic infarction. No mass, hydrocephalus or extra-axial hematoma. Vascular: No visible vascular abnormality. Skull: Normal Sinuses/Orbits: Clear/normal Other: None IMPRESSION: Right lateral cerebellar intraparenchymal hemorrhage measuring 4 cc volume with mild surrounding edema. Right lateral temporal intraparenchymal hematoma measuring 10 cc volume with mild surrounding edema. 6 mm right temporal intraparenchymal hematoma adjacent to the temporal tip of the lateral ventricle. Mild mass effect with 1-2 cm of right-to-left shift. No skull fracture or extra-axial bleed. These abnormalities were not visible in any way on the study 3 days ago. These results were communicated to Dr. Selina Cooley At 3:27 pmon 3/19/2022by text page via the [facility] messaging system. Electronically Signed   By: Paulina Fusi M.D.   On: 02/15/2021 15:28   VAS [place] LOWER EXTREMITY VENOUS (DVT)  Result Date: 02/13/2021  Lower Venous DVT Study Indications: SOB, and Massive pulmonary embolism, syncope. Thrombus noted in IVC by CT scan.  Risk Factors: ETOH abuse. Comparison Study: No prior study Performing Technologist: Sherren Kerns RVS  Examination Guidelines: A complete evaluation includes B-mode imaging, spectral Doppler, color Doppler, and power Doppler as needed of all accessible portions of each vessel. Bilateral testing is  considered an integral part of a complete examination. Limited examinations for reoccurring indications may be performed as noted. The reflux portion of the exam is performed with the patient in reverse Trendelenburg.  +---------+---------------+---------+-----------+----------+--------------+ RIGHT    CompressibilityPhasicitySpontaneityPropertiesThrombus Aging +---------+---------------+---------+-----------+----------+--------------+ CFV      Full           Yes      Yes                  sluggish flow  +---------+---------------+---------+-----------+----------+--------------+ SFJ      Full                                                        +---------+---------------+---------+-----------+----------+--------------+  FV Prox  Full           Yes      Yes                  sluggish flow  +---------+---------------+---------+-----------+----------+--------------+ FV Mid   Full                                                        +---------+---------------+---------+-----------+----------+--------------+ FV DistalFull                                                        +---------+---------------+---------+-----------+----------+--------------+ PFV      Full                                                        +---------+---------------+---------+-----------+----------+--------------+ POP      Full           No       No                   sluggish flow  +---------+---------------+---------+-----------+----------+--------------+ PTV      Full                                                        +---------+---------------+---------+-----------+----------+--------------+ PERO     Full                                                        +---------+---------------+---------+-----------+----------+--------------+   +---------+---------------+---------+-----------+----------+--------------+ LEFT      CompressibilityPhasicitySpontaneityPropertiesThrombus Aging +---------+---------------+---------+-----------+----------+--------------+ CFV      Full           Yes      Yes                                 +---------+---------------+---------+-----------+----------+--------------+ SFJ      Full                                                        +---------+---------------+---------+-----------+----------+--------------+ FV Prox  Partial        No       No                   Acute          +---------+---------------+---------+-----------+----------+--------------+ FV Mid   Partial        No       No  Acute          +---------+---------------+---------+-----------+----------+--------------+ FV DistalPartial        No       No                   Acute          +---------+---------------+---------+-----------+----------+--------------+ PFV      Full                                                        +---------+---------------+---------+-----------+----------+--------------+ POP      Partial        No       No                   Acute          +---------+---------------+---------+-----------+----------+--------------+ PTV      None                                                        +---------+---------------+---------+-----------+----------+--------------+ PERO     None                                                        +---------+---------------+---------+-----------+----------+--------------+     Summary: RIGHT: - There is no evidence of deep vein thrombosis in the lower extremity.  LEFT: - Findings consistent with acute deep vein thrombosis involving the left femoral vein, left popliteal vein, left posterior tibial veins, and left peroneal veins.  *See table(s) above for measurements and observations. Electronically signed by Lemar LivingsBrandon Cain MD on 02/13/2021 at 6:01:09 PM.    Final    VAS US UPPER EXTREMITY VENOUS  DUPLEX  Result Date: 02/19/2021 UPPER VENOUS STUDY  Indications: Pain Comparison Study: no prior Performing Technologist: Blanch MediaMegan Riddle RVS  Examination Guidelines: A complete evaluation includes B-mode imaging, spectral Doppler, color Doppler, and power Doppler as needed of all accessible portions of each vessel. Bilateral testing is considered an integral part of a complete examination. Limited examinations for reoccurring indications may be performed as noted.  Right Findings: +----------+------------+---------+-----------+----------+-----------------+ RIGHT     CompressiblePhasicitySpontaneousProperties     Summary      +----------+------------+---------+-----------+----------+-----------------+ IJV           Full       Yes       Yes                                +----------+------------+---------+-----------+----------+-----------------+ Subclavian    Full       Yes       Yes                                +----------+------------+---------+-----------+----------+-----------------+ Axillary      Full       Yes       Yes                                +----------+------------+---------+-----------+----------+-----------------+  Brachial      Full       Yes       Yes                                +----------+------------+---------+-----------+----------+-----------------+ Radial        None                                  Age Indeterminate +----------+------------+---------+-----------+----------+-----------------+ Ulnar         None                                  Age Indeterminate +----------+------------+---------+-----------+----------+-----------------+ Cephalic      Full                                                    +----------+------------+---------+-----------+----------+-----------------+ Basilic       Full                                                    +----------+------------+---------+-----------+----------+-----------------+   Summary:  Right: Findings consistent with age indeterminate deep vein thrombosis involving the right radial veins and right ulnar veins.  *See table(s) above for measurements and observations.  Diagnosing physician: Waverly Ferrari MD Electronically signed by Waverly Ferrari MD on 02/19/2021 at 5:16:09 AM.    Final    CT Maxillofacial Wo Contrast  Result Date: 02/13/2021 CLINICAL DATA:  Initial evaluation for acute trauma, fall. EXAM: CT MAXILLOFACIAL WITHOUT CONTRAST TECHNIQUE: Multidetector CT imaging of the maxillofacial structures was performed. Multiplanar CT image reconstructions were also generated. COMPARISON:  None. FINDINGS: Osseous: Zygomatic arches intact. No acute maxillary fracture. Pterygoid plates intact. Nasal bones intact. Nasal septum midline and intact. Mandible intact. Individual condyles normally situated. No acute abnormality about the dentition. Orbits: Globes normal soft tissues within normal limits. Bony orbits intact. Sinuses: Paranasal sinuses are clear. Soft tissues: No appreciable soft tissue injury about the face. Few scattered subtle foci of soft tissue emphysema favored to lie within the venous system, likely related IV access. Limited intracranial: Unremarkable. IMPRESSION: No acute maxillofacial injury identified. No fracture. Electronically Signed   By: Rise Mu M.D.   On: 02/13/2021 00:31    Labs: BNP (last 3 results) Recent Labs    02/12/21 2203  BNP 213.4*   Basic Metabolic Panel: Recent Labs  Lab 02/16/21 0138 02/16/21 1713 02/17/21 0548 02/17/21 0903 02/18/21 0105 02/19/21 0910 02/20/21 0351  NA 137  --   --  134* 136 136 137  K 3.7  --   --  3.9 3.4* 3.6 3.5  CL 102  --   --  102 105 104 103  CO2 25  --   --  25 24 27 28   GLUCOSE 77  --   --  90 87 131* 99  BUN 6  --   --  8 9 5* 5*  CREATININE 0.98  --   --  1.19 1.15 1.25* 1.15  CALCIUM 8.2*  --   --  8.1* 8.1* 8.6* 8.5*  MG 1.5* 1.5* 1.9  --   --   --   --   PHOS  --   2.8 3.5  --   --   --   --    Liver Function Tests: Recent Labs  Lab 02/14/21 0531 02/15/21 0143 02/15/21 1510 02/19/21 0910 02/20/21 0351  AST 67* 37 40 27 25  ALT 36 32 34 23 21  ALKPHOS 33* 39 40 32* 36*  BILITOT 1.2 0.8 0.6 1.0 0.7  PROT 5.7* 5.6* 7.2 6.4* 6.5  ALBUMIN 2.4* 2.3* 3.0* 2.6* 2.5*   No results for input(s): LIPASE, AMYLASE in the last 168 hours. No results for input(s): AMMONIA in the last 168 hours. CBC: Recent Labs  Lab 02/15/21 1510 02/15/21 1638 02/16/21 0138 02/17/21 0903 02/18/21 0105 02/19/21 0910 02/20/21 0351  WBC 10.8*  --  7.6 9.1 6.1 6.6 7.6  NEUTROABS 8.1*  --   --   --   --   --   --   HGB 16.3   < > 13.8 15.2 14.0 14.8 14.7  HCT 50.2   < > 40.1 45.3 42.0 43.2 44.3  MCV 102.2*  --  98.5 99.8 99.1 96.9 98.4  PLT 182  --  148* 150 164 208 232   < > = values in this interval not displayed.   Cardiac Enzymes: No results for input(s): CKTOTAL, CKMB, CKMBINDEX, TROPONINI in the last 168 hours. BNP: Invalid input(s): POCBNP CBG: Recent Labs  Lab 02/19/21 1608 02/19/21 2021 02/19/21 2336 02/20/21 0342 02/20/21 0809  GLUCAP 128* 127* 98 101* 96   D-Dimer No results for input(s): DDIMER in the last 72 hours. Hgb A1c No results for input(s): HGBA1C in the last 72 hours. Lipid Profile No results for input(s): CHOL, HDL, LDLCALC, TRIG, CHOLHDL, LDLDIRECT in the last 72 hours. Thyroid function studies No results for input(s): TSH, T4TOTAL, T3FREE, THYROIDAB in the last 72 hours.  Invalid input(s): FREET3 Anemia work up Recent Labs    02/18/21 2026  VITAMINB12 221   Urinalysis    Component Value Date/Time   COLORURINE YELLOW 02/15/2021 1719   APPEARANCEUR HAZY (A) 02/15/2021 1719   LABSPEC 1.019 02/15/2021 1719   PHURINE 5.0 02/15/2021 1719   GLUCOSEU NEGATIVE 02/15/2021 1719   HGBUR NEGATIVE 02/15/2021 1719   BILIRUBINUR NEGATIVE 02/15/2021 1719   KETONESUR NEGATIVE 02/15/2021 1719   PROTEINUR NEGATIVE 02/15/2021 1719    NITRITE NEGATIVE 02/15/2021 1719   LEUKOCYTESUR TRACE (A) 02/15/2021 1719   Sepsis Labs Invalid input(s): PROCALCITONIN,  WBC,  LACTICIDVEN Microbiology Recent Results (from the past 240 hour(s))  Resp Panel by RT-PCR (Flu A&B, Covid) Nasopharyngeal Swab     Status: None   Collection Time: 02/12/21 10:00 PM   Specimen: Nasopharyngeal Swab; Nasopharyngeal(NP) swabs in vial transport medium  Result Value Ref Range Status   SARS Coronavirus 2 by RT PCR NEGATIVE NEGATIVE Final    Comment: (NOTE) SARS-CoV-2 target nucleic acids are NOT DETECTED.  The SARS-CoV-2 RNA is generally detectable in upper respiratory specimens during the acute phase of infection. The lowest concentration of SARS-CoV-2 viral copies this assay can detect is 138 copies/mL. A negative result does not preclude SARS-Cov-2 infection and should not be used as the sole basis for treatment or other patient management decisions. A negative result may occur with  improper specimen collection/handling, submission of specimen other than nasopharyngeal swab, presence of viral mutation(s) within the areas targeted by this assay, and inadequate number of viral copies(<138  copies/mL). A negative result must be combined with clinical observations, patient history, and epidemiological information. The expected result is Negative.  Fact Sheet for Patients:  BloggerCourse.com  Fact Sheet for Healthcare Providers:  SeriousBroker.it  This test is no t yet approved or cleared by the [place] FDA and  has been authorized for detection and/or diagnosis of SARS-CoV-2 by FDA under an Emergency Use Authorization (EUA). This EUA will remain  in effect (meaning this test can be used) for the duration of the COVID-19 declaration under Section 564(b)(1) of the Act, 21 U.S.C.section 360bbb-3(b)(1), unless the authorization is terminated  or revoked sooner.       Influenza A by PCR  NEGATIVE NEGATIVE Final   Influenza B by PCR NEGATIVE NEGATIVE Final    Comment: (NOTE) The Xpert Xpress SARS-CoV-2/FLU/RSV plus assay is intended as an aid in the diagnosis of influenza from Nasopharyngeal swab specimens and should not be used as a sole basis for treatment. Nasal washings and aspirates are unacceptable for Xpert Xpress SARS-CoV-2/FLU/RSV testing.  Fact Sheet for Patients: BloggerCourse.com  Fact Sheet for Healthcare Providers: SeriousBroker.it  This test is not yet approved or cleared by the [place] FDA and has been authorized for detection and/or diagnosis of SARS-CoV-2 by FDA under an Emergency Use Authorization (EUA). This EUA will remain in effect (meaning this test can be used) for the duration of the COVID-19 declaration under Section 564(b)(1) of the Act, 21 U.S.C. section 360bbb-3(b)(1), unless the authorization is terminated or revoked.  Performed at [facility], [address] [place], [place] [address]   MRSA PCR Screening     Status: None   Collection Time: 02/13/21  4:35 AM   Specimen: Nasal Mucosa; Nasopharyngeal  Result Value Ref Range Status   MRSA by PCR NEGATIVE NEGATIVE Final    Comment:        The GeneXpert MRSA Assay (FDA approved for NASAL specimens only), is one component of a comprehensive MRSA colonization surveillance program. It is not intended to diagnose MRSA infection nor to guide or monitor treatment for MRSA infections. Performed at [facility], [address], [place], [place] [address]   Resp Panel by RT-PCR (Flu A&B, Covid) Nasopharyngeal Swab     Status: None   Collection Time: 02/15/21  3:36 PM   Specimen: Nasopharyngeal Swab; Nasopharyngeal(NP) swabs in vial transport medium  Result Value Ref Range Status   SARS Coronavirus 2 by RT PCR NEGATIVE NEGATIVE Final    Comment: (NOTE) SARS-CoV-2 target nucleic acids are NOT  DETECTED.  The SARS-CoV-2 RNA is generally detectable in upper respiratory specimens during the acute phase of infection. The lowest concentration of SARS-CoV-2 viral copies this assay can detect is 138 copies/mL. A negative result does not preclude SARS-Cov-2 infection and should not be used as the sole basis for treatment or other patient management decisions. A negative result may occur with  improper specimen collection/handling, submission of specimen other than nasopharyngeal swab, presence of viral mutation(s) within the areas targeted by this assay, and inadequate number of viral copies(<138 copies/mL). A negative result must be combined with clinical observations, patient history, and epidemiological information. The expected result is Negative.  Fact Sheet for Patients:  BloggerCourse.com  Fact Sheet for Healthcare Providers:  SeriousBroker.it  This test is no t yet approved or cleared by the [place] FDA and  has been authorized for detection and/or diagnosis of SARS-CoV-2 by FDA under an Emergency Use Authorization (EUA). This EUA will remain  in effect (meaning this test can be used) for the duration of the COVID-19 declaration under Section 564(b)(1) of the Act, 21 U.S.C.section 360bbb-3(b)(1), unless the authorization is terminated  or revoked sooner.       Influenza A by PCR NEGATIVE NEGATIVE Final   Influenza B by PCR NEGATIVE NEGATIVE Final    Comment: (NOTE) The Xpert Xpress SARS-CoV-2/FLU/RSV plus assay is intended as an aid in the diagnosis of influenza from Nasopharyngeal swab specimens and should not be used as a sole basis for treatment. Nasal washings and aspirates are unacceptable for Xpert Xpress SARS-CoV-2/FLU/RSV testing.  Fact Sheet for Patients: BloggerCourse.com  Fact Sheet for Healthcare Providers: SeriousBroker.it  This test is not yet  approved or cleared by the [place] FDA and has been authorized for detection and/or diagnosis of SARS-CoV-2 by FDA under an Emergency Use Authorization (EUA). This EUA will remain in effect (meaning this test can be used) for the duration of the COVID-19 declaration under Section 564(b)(1) of the Act, 21 U.S.C. section 360bbb-3(b)(1), unless the authorization is terminated or revoked.  Performed at [facility], [address] [place], [place] [address]      Time coordinating discharge: 35 minutes  SIGNED: Lanae Boast, MD  [facility] 02/20/2021, 11:20 AM  If 7PM-7AM, please contact night-coverage [email or web address]

## 2021-02-20 NOTE — Progress Notes (Signed)
Patient's IV and telemetry removed, no belongings at bedside. RN reviewed discharge papers with patient and confirmed understanding. Patient is waiting on medications from [facility] pharmacy and a spiritual care consult before discharge. Will be transported home by family member.

## 2021-02-20 NOTE — TOC Transition Note (Signed)
Transition of Care [facility]) - CM/SW Discharge Note   Patient Details  Name: Danny Lowe MRN: [id number] Date of Birth: 13-May-1971  Transition of [facility]) CM/SW Contact:  Kermit Balo, RN Phone Number: 02/20/2021, 10:10 AM   Clinical Narrative:    Patient is discharging home today with outpatient therapy through [facility] Information on the AVS. Medications for home to be delivered to patients room from [facility] pharmacy.  Pt has support at home and transportation to home.   Final next level of care: OP Rehab Barriers to Discharge: No Barriers Identified   Patient Goals and CMS Choice     Choice offered to / list presented to : Parent  Discharge Placement                       Discharge Plan and Services   Discharge Planning Services: CM Consult                                 Social Determinants of Health (SDOH) Interventions     Readmission Risk Interventions No flowsheet data found.

## 2021-02-20 NOTE — Progress Notes (Signed)
OT Cancellation Note  Patient Details Name: Danny Lowe MRN: [id number] DOB: July 04, 1971   Cancelled Treatment:    Reason Eval/Treat Not Completed: Patient declined, no reason specified- pt declined OT today, plans on discharging shortly.  Good family support, still recommend OP at this time based on OT evaluation from 3/22.   Barry Brunner, OT Acute Rehabilitation Services Pager [phone] Office [phone]   Chancy Milroy 02/20/2021, 11:22 AM

## 2021-02-20 NOTE — Progress Notes (Signed)
   02/20/21 1120  Clinical Encounter Type  Visited With Patient and family together  Visit Type Follow-up  Referral From Nurse  Consult/Referral To Chaplain  Chaplain responded to page. The patient was sitting on the side of the bed. His brother, Arihant Pennings and mother, Mrs Schank. The patient stated the Advance Directive is filled out and ready to be notarized. I contacted volunteer services 2x's and was told no one available to witness. I apologized for inconvenience. The patient stated he is being discharged today and will have notarized outside of hospital.

## 2021-02-20 NOTE — Progress Notes (Signed)
   02/20/21 0924  Clinical Encounter Type  Visited With Patient  Visit Type Initial  Referral From Nurse  Consult/Referral To Chaplain   Chaplain responded to page. Pt stated that his brother wanted to talk with a chaplain but wasn't here at the moment. Pt said he would send his brother to the chaplain's office. Chaplain will check back later if they don't hear from the brother.    This note was prepared by Chaplain Resident, Tacy Learn, MDiv. Chaplain remains available as needed through the on-call pager: [phone].

## 2021-02-21 LAB — CARDIOLIPIN ANTIBODIES, IGG, IGM, IGA
Anticardiolipin IgA: <9 APL U/mL (ref 0–11)
Anticardiolipin IgG: <9 GPL U/mL (ref 0–14)
Anticardiolipin IgM: <9 MPL U/mL (ref 0–12)

## 2021-02-21 LAB — BETA-2-GLYCOPROTEIN I ABS, IGG/M/A
Beta-2 Glyco I IgG: 54 GPI IgG units — ABNORMAL HIGH (ref 0–20)
Beta-2-Glycoprotein I IgA: 10 GPI IgA units (ref 0–25)
Beta-2-Glycoprotein I IgM: <9 GPI IgM units (ref 0–32)

## 2021-02-24 LAB — FACTOR 5 LEIDEN

## 2021-02-25 ENCOUNTER — Ambulatory Visit: Payer: 59 | Admitting: Occupational Therapy

## 2021-02-25 ENCOUNTER — Encounter: Payer: Self-pay | Admitting: Occupational Therapy

## 2021-02-25 ENCOUNTER — Ambulatory Visit: Payer: 59 | Attending: Internal Medicine

## 2021-02-25 ENCOUNTER — Other Ambulatory Visit: Payer: Self-pay

## 2021-02-25 VITALS — BP 112/72 | HR 78

## 2021-02-25 DIAGNOSIS — R2689 Other abnormalities of gait and mobility: Secondary | ICD-10-CM | POA: Diagnosis present

## 2021-02-25 DIAGNOSIS — R41844 Frontal lobe and executive function deficit: Secondary | ICD-10-CM

## 2021-02-25 DIAGNOSIS — M6281 Muscle weakness (generalized): Secondary | ICD-10-CM

## 2021-02-25 DIAGNOSIS — R2681 Unsteadiness on feet: Secondary | ICD-10-CM | POA: Diagnosis present

## 2021-02-25 DIAGNOSIS — R41840 Attention and concentration deficit: Secondary | ICD-10-CM

## 2021-02-25 NOTE — Therapy (Signed)
[facility] [address] [place], [place], [address] Phone: [phone]   Fax:  [phone]  Occupational Therapy Evaluation  Patient Details  Name: Danny Lowe MRN: [id number] Date of Birth: [birth year] Referring Provider (OT): Dr. Vinnie Level. Katrinka Blazing (referral), PCP is Horton Marshall   Encounter Date: 02/25/2021   OT End of Session - 02/25/21 1515    Visit Number 1    Number of Visits 10    Date for OT Re-Evaluation 04/03/21    Authorization Type UHC:  60 visit limit combined, hard max, no auth req.    OT Start Time 1020    OT Stop Time 1100    OT Time Calculation (min) 40 min    Activity Tolerance Patient tolerated treatment well    Behavior During Therapy WFL for tasks assessed/performed           Past Medical History:  Diagnosis Date  . Depression     Past Surgical History:  Procedure Laterality Date  . IR IVC FILTER PLMT / S&I /IMG GUID/MOD SED  02/16/2021    There were no vitals filed for this visit.   Subjective Assessment - 02/25/21 1023    Subjective  Pt reports that he is doing all typical activities except driving, working, cooking, and working out    Pertinent History hospitalized 02/12/21 after syncopal event and hitting his face. He was found to have a right frontal/occipital hematoma.   He was also found to have unprovoked massive PE with right heart strain and extensive left DVT. He was treated with half dose tPA and heparin drip.   He was transitioned to Eliquis 3/18 and was discharged home 3/19 discharged for PE/ DVT found down 2hrs later in parking lot; code stroke-> found with ICH, witnessed seizure, and intubated. IVC filter was placed.   PMH:  history of daily alcohol abuse, PTSD, depression    Limitations hx of seizures    Patient Stated Goals concerns with return to heavier demands at work--has began FMLA paperwork (some concerns prior to hospitalization), cognitive changes/attention     Currently in Pain? No/denies             [facility] OT Assessment - 02/25/21 1026      Assessment   Medical Diagnosis ICH    Referring Provider (OT) Dr. Vinnie Level. Katrinka Blazing (referral), PCP is Horton Marshall    Onset Date/Surgical Date 02/12/21    Hand Dominance Left    Prior Therapy acute care      Precautions   Precautions --   no driving, no drinking, s/p seizure     Balance Screen   Has the patient fallen in the past 6 months --   see PT eval     Home  Environment   Family/patient expects to be discharged to: Private residence    Lives With Son   50 y.o. son, mother also currently staying with him     Prior Function   Level of Independence Independent    Vocation Full time employment    Vocation Requirements [place] [employer] [place]   dig ditches to get to pipes   Leisure worked out multiple times a week  lifting weights, enjoys cooking, cleaning, reading      ADL   ADL comments Pt reports performing BADLs mod I.  Has cut his hair himself.      IADL   Shopping Assistance for transportation    [employer] --   pt has been  performing cleaning tasks (moped)   [employer]al [employer] --   Has not performed cooking tasks since he has been home.   Community Mobility Relies on family or friends for transportation   currently unable to drive (s/p seizure)   Medication Management Is responsible for taking medication in correct dosages at correct time    Financial Management --   has not attempted since returned home     Mobility   Mobility Status Independent   see PT eval for details     Vision Assessment   Ocular Range of Motion Within Functional Limits    Tracking/Visual Pursuits Able to track stimulus in all quads without difficulty    Convergence Within functional limits    Visual Fields No apparent deficits    Comment Pt report that vision has returned to baseline      Cognition   Overall Cognitive Status Impaired/Different from baseline   Pt reports that he feels that  he feels focus in not as good and still has periods of time that he can't remember from hospitalization   Area of Impairment Attention;Memory;Problem solving    Current Attention Level Alternating   decr alternating attention with significant difficulty with trail making, serial 7's (mild difficulty with spelling "world" backwards.   Memory Decreased short-term memory   0/5 delayed recall   Problem Solving Difficulty sequencing    Cognition Comments Pt with difficulty with executive functioning as sseen with significant difficulty with trailmaking (short version), copying cube, and difficulty with hands/numbers for clock drawing.  (Naming, abstraction, sustained/selective attention, abstraction, orientation appear WFL, but pt did demo difficulty with lauange fluency).      Sensation   Light Touch Appears Intact   pt denies deficits     Coordination   9 Hole Peg Test Right;Left    Right 9 Hole Peg Test 28.32    Left 9 Hole Peg Test 25.21      ROM / Strength   AROM / PROM / Strength AROM;Strength      AROM   Overall AROM  Within functional limits for tasks performed   BUEs     Strength   Overall Strength Deficits    Overall Strength Comments --   BUE strength grossly WNL execpt R shoulder abduction 4/5 (pt reports he fell on R side).  Pt denies pain     Hand Function   Right Hand Grip (lbs) 77.3    Left Hand Grip (lbs) 83.3                           OT Education - 02/25/21 1457    Education Details OT eval results and POC.  Recommended pt only cook with mother supervising.    Person(s) Educated Patient    Methods Explanation    Comprehension Verbalized understanding               OT Long Term Goals - 02/25/21 1529      OT LONG TERM GOAL #1   Title Pt will verbalize understanding of memory compensation strategies.--check goals 04/03/21    Time 5    Period Weeks    Status New      OT LONG TERM GOAL #2   Title Pt will divide attention between at least 1  physical and 1 cognitive task with at least 90% accuracy.    Time 5    Period Weeks    Status New  OT LONG TERM GOAL #3   Title Pt will perform mod complex cooking task mod I.    Time 5    Period Weeks    Status New      OT LONG TERM GOAL #4   Title Pt will perform complex functional problem solving, organizing, and sequencing tasks with at least 90% accuracy    Time 5    Period Weeks    Status New                 Plan - 02/25/21 1516    Clinical Impression Statement Pt is a 50 y.o. male who was hospitalized 02/12/21 after syncopal event and hitting his face. He was found to have a right frontal/occipital hematoma.   He was also found to have unprovoked massive PE with right heart strain and extensive left DVT. He was treated with half dose tPA and heparin drip.   He was transitioned to Eliquis 3/18 and was discharged home 3/19 discharged for PE/ DVT found down 2hrs later in parking lot; code stroke-> found with ICH, witnessed seizure, and intubated. IVC filter was placed.  Pt was independent and working full time prior to hospitalization.  Pt currently not working, driving, lifting weights, or cooking.  Pt with PMH that also includes:  history of daily alcohol abuse, PTSD, depression.  Pt presents today with cognitive deficits, R shoulder weakness.  Pt would benefit from occupational therapy to address these deficits for incr safety and independence with IADLs.    OT Occupational Profile and History Detailed Assessment- Review of Records and additional review of physical, cognitive, psychosocial history related to current functional performance    Occupational performance deficits (Please refer to evaluation for details): ADL's;IADL's;Leisure;Work    Games developer / Function / Physical Skills IADL;Strength    Cognitive Skills Attention;Memory;Sequencing;Problem Solve    Rehab Potential Good    Clinical Decision Making Several treatment options, min-mod task modification  necessary    Comorbidities Affecting Occupational Performance: May have comorbidities impacting occupational performance    Modification or Assistance to Complete Evaluation  Min-Moderate modification of tasks or assist with assess necessary to complete eval    OT Frequency 2x / week    OT Duration --   for 5 weeks +eval (or 11 visits total)   OT Treatment/Interventions Self-care/ADL training;Therapeutic activities;Ultrasound;Therapeutic exercise;Cognitive remediation/compensation;Neuromuscular education;Cryotherapy;Manual Therapy;Patient/family education    Plan alternating attention, memory compensation strategies, R shoulder strengthening    Consulted and Agree with Plan of Care Patient           Patient will benefit from skilled therapeutic intervention in order to improve the following deficits and impairments:   Body Structure / Function / Physical Skills: IADL,Strength Cognitive Skills: Attention,Memory,Sequencing,Problem Solve     Visit Diagnosis: Attention and concentration deficit  Muscle weakness (generalized)  Frontal lobe and executive function deficit    Problem List Patient Active Problem List   Diagnosis Date Noted  . Acute respiratory failure with hypoxia and hypercapnia (HCC)   . ICH (intracerebral hemorrhage) (HCC) 02/15/2021  . Pulmonary embolism (HCC) 02/13/2021    [facility] 02/25/2021, 3:44 PM  [facility] [address] [place], [place], [address] Phone: [phone]   Fax:  [phone]  Name: Danny Lowe MRN: [id number] Date of Birth: 05-Nov-1971   Willa Frater, OTR/L [facility] [address] [place], [place]  [address] [phone] phone [phone] 02/25/21 3:44 PM

## 2021-02-25 NOTE — Therapy (Signed)
[facility] [address] [place], [place], [address] Phone: [phone]   Fax:  [phone]  Physical Therapy Evaluation  Patient Details  Name: Danny Lowe MRN: [id number] Date of Birth: 28-Mar-1971 Referring Provider (PT): referred by Levon Hedger  (hospitalist) and PCP is Horton Marshall   Encounter Date: 02/25/2021   PT End of Session - 02/25/21 0913    Visit Number 1    Number of Visits 3    Authorization Type UHC    PT Start Time 0907    PT Stop Time 1005    PT Time Calculation (min) 58 min    Activity Tolerance Patient tolerated treatment well    Behavior During Therapy [facility] for tasks assessed/performed           Past Medical History:  Diagnosis Date  . Depression     Past Surgical History:  Procedure Laterality Date  . IR IVC FILTER PLMT / S&I /IMG GUID/MOD SED  02/16/2021    Vitals:   02/25/21 0927  BP: 112/72  Pulse: 78  SpO2: 98%      Subjective Assessment - 02/25/21 0913    Subjective Pt was hospitalized 3/16 after syncopal event and hitting his face. He was found to have a right frontal/ occipital hematoma.   He was also found to have unprovoked massive PE with right heart strain and extensive left DVT. He was treated with half dose tPA and heparin drip.   He was transitioned to Eliquis 3/18 and was discharged home.     3/19 discharged for PE/ DVT found down 2hrs later in parking lot; code stroke-> found with ICH, witnessed seizure, and intubated. IVC filter was placed.  Pt reports that he is doing much better than he was. 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 Feels he may be a little more layed back and seems to drift off a bit but feels he is more reflecting on his situation. Still has some pockets of time that he can't account for. He finds that his balance may be slightly off from what it was. Denies any drinking since returning home.    Pertinent History PMH: 50 year old  male with history of daily alcohol abuse, PTSD (was in [employer]), depression    Patient Stated Goals Feels he is progressing well but wants to get any help he can.    Currently in Pain? Yes    Pain Location Leg   in left hamstring   Pain Orientation Left    Pain Descriptors / Indicators Cramping    Pain Type Acute pain    Pain Onset 1 to 4 weeks ago    Pain Frequency Intermittent    Aggravating Factors  history of DVT left leg with some swelling              [facility] PT Assessment - 02/25/21 0921      Assessment   Medical Diagnosis ICH    Referring Provider (PT) referred by Levon Hedger  (hospitalist) and PCP is Horton Marshall    Onset Date/Surgical Date 02/12/21    Hand Dominance Left    Prior Therapy acute care      Precautions   Precautions None      Balance Screen   Has the patient fallen in the past 6 months Yes    How many times? 3    Has the patient had a decrease in activity level because of a fear of falling?  No  Is the patient reluctant to leave their home because of a fear of falling?  No      Home Environment   Living Environment Private residence    Living Arrangements Parent    Available Help at Discharge Family    Type of Home House    Home Access Stairs to enter    Entrance Stairs-Number of Steps 3    Entrance Stairs-Rails None    Home Layout One level    Home Equipment None    Additional Comments Currently staying at his mom's so he is not alone. His apartment with no stairs to enter. His son lives there too but works during the day.      Prior Function   Level of Independence Independent    Vocation Full time employment    Vocation Requirements city of [employer]    Leisure worked out multiple times per week weight lifting      Cognition   Overall Cognitive Status Within Functional Limits for tasks assessed      Observation/Other Assessments-Edema    Edema --   pitting edema LLE     Sensation   Light Touch Appears Intact      ROM /  Strength   AROM / PROM / Strength Strength      Strength   Overall Strength Comments Strength WNL throughout UE and BLE      Transfers   Transfers Sit to Stand;Stand to Sit    Sit to Stand 7: Independent    Stand to Sit 7: Independent      Ambulation/Gait   Ambulation/Gait Yes    Ambulation/Gait Assistance 7: Independent    Ambulation Distance (Feet) 200 Feet    Assistive device None    Gait Pattern Step-through pattern;Decreased arm swing - right    Ambulation Surface Level;Indoor    Gait velocity 9.39 sec=1.59m/s    Stairs Yes    Stairs Assistance 7: Independent    Stair Management Technique Alternating pattern;No rails    Number of Stairs 4    Gait Comments Pt ambulated outside >850' 4 minutes on paved and grassy surfaces with no LOB. HR=70, O2 sat=100, BP=120/80 after gait.      High Level Balance   High Level Balance Comments SLS >20 sec each leg.      Functional Gait  Assessment   Gait assessed  Yes    Gait Level Surface Walks 20 ft in less than 5.5 sec, no assistive devices, good speed, no evidence for imbalance, normal gait pattern, deviates no more than 6 in outside of the 12 in walkway width.    Change in Gait Speed Able to smoothly change walking speed without loss of balance or gait deviation. Deviate no more than 6 in outside of the 12 in walkway width.    Gait with Horizontal Head Turns Performs head turns smoothly with slight change in gait velocity (eg, minor disruption to smooth gait path), deviates 6-10 in outside 12 in walkway width, or uses an assistive device.    Gait with Vertical Head Turns Performs head turns with no change in gait. Deviates no more than 6 in outside 12 in walkway width.    Gait and Pivot Turn Pivot turns safely within 3 sec and stops quickly with no loss of balance.    Step Over Obstacle Is able to step over 2 stacked shoe boxes taped together (9 in total height) without changing gait speed. No evidence of imbalance.    Gait  with Narrow  Base of Support Is able to ambulate for 10 steps heel to toe with no staggering.    Gait with Eyes Closed Walks 20 ft, uses assistive device, slower speed, mild gait deviations, deviates 6-10 in outside 12 in walkway width. Ambulates 20 ft in less than 9 sec but greater than 7 sec.    Ambulating Backwards Walks 20 ft, no assistive devices, good speed, no evidence for imbalance, normal gait    Steps Alternating feet, no rail.    Total Score 28                      Objective measurements completed on examination: See above findings.               PT Education - 02/25/21 1205    Education Details PT plan of care. Discussed avoiding any heavy weight lifting at this time until cleared by neurologist. Advised to start walking daily.    Person(s) Educated Patient    Methods Explanation    Comprehension Verbalized understanding            PT Short Term Goals - 02/25/21 1217      PT SHORT TERM GOAL #1   Title STGs=LTGs             PT Long Term Goals - 02/25/21 1217      PT LONG TERM GOAL #1   Title Pt will be independent with high leval balance and aerobic training HEP to transition to gym.    Time 3    Period Weeks    Status New    Target Date 03/18/21      PT LONG TERM GOAL #2   Title Pt will be able to ambulate with horizontal head turns scanning environment on varied surfaces independently to transition to busy community spaces.    Time 3    Period Weeks    Status New    Target Date 03/18/21                  Plan - 02/25/21 1210    Clinical Impression Statement Pt is 50 y/o male with recent hospitalization for PE, DVT left leg then ICH. IVC filter was placed. Pt has progressed well. Vitals WNL at visit today with activities. Gait speed safe for community ambulator at 1.431m/s but slightly decreased compared to norms. PT also noted decreased arm swing on right. Pt low fall risk with FGA score of 28/30. Most challenged with gait with horizontal  head turns and eyes closed. SLS >20 sec bilateral. PT will pick up short term to establish safe HEP for pt to transition back to gym.    Personal Factors and Comorbidities Comorbidity 3+    Comorbidities PMH: 50 year old male with history of daily alcohol abuse, PTSD, depression    Examination-Activity Limitations Locomotion Level    Examination-Participation Restrictions Community Activity;Occupation    Stability/Clinical Decision Making Stable/Uncomplicated    Clinical Decision Making Low    Rehab Potential Good    PT Frequency 1x / week    PT Duration 3 weeks    PT Treatment/Interventions ADLs/Self Care Home Management;Neuromuscular re-education;Balance training;Gait training;Stair training;Functional mobility training;Therapeutic activities;Therapeutic exercise    PT Next Visit Plan Establish high level balance HEP including SLS activities, gait with horizontal head turns. Work on aerobic activity with gait on treadmill while monitoring vitals.    Consulted and Agree with Plan of Care Patient  Patient will benefit from skilled therapeutic intervention in order to improve the following deficits and impairments:  Abnormal gait,Decreased balance  Visit Diagnosis: Other abnormalities of gait and mobility  Unsteadiness on feet     Problem List Patient Active Problem List   Diagnosis Date Noted  . Acute respiratory failure with hypoxia and hypercapnia (HCC)   . ICH (intracerebral hemorrhage) (HCC) 02/15/2021  . Pulmonary embolism (HCC) 02/13/2021    Ronn Melena, PT, DPT, NCS 02/25/2021, 12:20 PM  [facility] [address] [place], [place], [address] Phone: [phone]   Fax:  [phone]  Name: Danny Lowe MRN: [id number] Date of Birth: 07-Sep-1971

## 2021-02-27 LAB — PROTHROMBIN GENE MUTATION

## 2021-03-04 ENCOUNTER — Other Ambulatory Visit: Payer: Self-pay

## 2021-03-04 ENCOUNTER — Ambulatory Visit: Payer: 59 | Admitting: Occupational Therapy

## 2021-03-04 ENCOUNTER — Encounter: Payer: Self-pay | Admitting: Neurology

## 2021-03-04 ENCOUNTER — Ambulatory Visit: Payer: 59 | Admitting: Neurology

## 2021-03-04 ENCOUNTER — Ambulatory Visit: Payer: 59 | Attending: Internal Medicine

## 2021-03-04 VITALS — BP 119/73 | HR 73 | Ht 66.0 in | Wt 197.0 lb

## 2021-03-04 VITALS — BP 114/72 | HR 68

## 2021-03-04 DIAGNOSIS — R2681 Unsteadiness on feet: Secondary | ICD-10-CM

## 2021-03-04 DIAGNOSIS — R41844 Frontal lobe and executive function deficit: Secondary | ICD-10-CM | POA: Insufficient documentation

## 2021-03-04 DIAGNOSIS — I611 Nontraumatic intracerebral hemorrhage in hemisphere, cortical: Secondary | ICD-10-CM | POA: Diagnosis not present

## 2021-03-04 DIAGNOSIS — R41840 Attention and concentration deficit: Secondary | ICD-10-CM

## 2021-03-04 DIAGNOSIS — G40009 Localization-related (focal) (partial) idiopathic epilepsy and epileptic syndromes with seizures of localized onset, not intractable, without status epilepticus: Secondary | ICD-10-CM

## 2021-03-04 DIAGNOSIS — M6281 Muscle weakness (generalized): Secondary | ICD-10-CM | POA: Diagnosis present

## 2021-03-04 DIAGNOSIS — R2689 Other abnormalities of gait and mobility: Secondary | ICD-10-CM

## 2021-03-04 DIAGNOSIS — R42 Dizziness and giddiness: Secondary | ICD-10-CM | POA: Insufficient documentation

## 2021-03-04 MED ORDER — LEVETIRACETAM 750 MG PO TABS
1500.0000 mg | ORAL_TABLET | Freq: Two times a day (BID) | ORAL | 1 refills | Status: DC
Start: 2021-03-04 — End: 2021-07-29

## 2021-03-04 NOTE — Therapy (Signed)
[facility] [address] [place], [place], [address] Phone: [phone]   Fax:  [phone]  Occupational Therapy Treatment  Patient Details  Name: Danny Lowe MRN: [id number] Date of Birth: 17-Feb-1971 Referring Provider (OT): Dr. Vinnie Level. Katrinka Blazing (referral), PCP is Horton Marshall   Encounter Date: 03/04/2021   OT End of Session - 03/04/21 1106    Visit Number 2    Number of Visits 10    Date for OT Re-Evaluation 04/03/21    Authorization Type U[employer]:  60 visit limit combined, hard max, no auth req.    OT Start Time 1105    OT Stop Time 1150    OT Time Calculation (min) 45 min    Activity Tolerance Patient tolerated treatment well    Behavior During Therapy WFL for tasks assessed/performed           Past Medical History:  Diagnosis Date  . Depression     Past Surgical History:  Procedure Laterality Date  . IR IVC FILTER PLMT / S&I /IMG GUID/MOD SED  02/16/2021    There were no vitals filed for this visit.   Subjective Assessment - 03/04/21 1104    Subjective  baked chicken and corn since last visit.  Pt reports feeling a little pressure/squeeze in head on R side. (recommended pt discuss with neurologist)  Cautious of getting up and turning slowly.    Patient is accompanied by: Family member   mother   Pertinent History hospitalized 02/12/21 after syncopal event and hitting his face. He was found to have a right frontal/occipital hematoma.   He was also found to have unprovoked massive PE with right heart strain and extensive left DVT. He was treated with half dose tPA and heparin drip.   He was transitioned to Eliquis 3/18 and was discharged home 3/19 discharged for PE/ DVT found down 2hrs later in parking lot; code stroke-> found with ICH, witnessed seizure, and intubated. IVC filter was placed.   PMH:  history of daily alcohol abuse, PTSD, depression    Limitations hx of seizures    Patient Stated Goals concerns  with return to heavier demands at work--has began FMLA paperwork (some concerns prior to hospitalization), cognitive changes/attention    Currently in Pain? No/denies            Constant Therapy:  Symbol Matching level 8 with 93% accuracy.   Alternating symbol Matching, level 8 with 95% accuracy and 95.25sec average response time in busy environment, min cueing.  Cued pt try to search for things in organized pattern, double check himself.  Copying PVC pipe design with min difficulty initially with decr attention to detail/problem solving.  Recommended pt sort pieces first (with cueing for problem solving how to determine correct piece).  Once pt sorted pieces, pt copied design without difficulty.  Pt reports head hurts at end of session likely due to incr effort/fatigue for cognitive tasks.  Discussed/recommended pt try challenging activities, but rest if getting a headache/foverwhelmed.   Discussed that pt appears to demo some difficulty with attention to detail, some organization/problem-solving, and alternating/divided attention and how this relates to higher level functional tasks (in prep for cooking, work, driving).  Recommended pt try to read, do word search/crossword while watching tv/radio and try to track commercials/songs.  Recommended pt cook with someone at home initially and avoid distractions when cooking (tv, phone).  Pt/mother verbalized understanding.           OT Long Term  Goals - 02/25/21 1529      OT LONG TERM GOAL #1   Title Pt will verbalize understanding of memory compensation strategies.--check goals 04/03/21    Time 5    Period Weeks    Status New      OT LONG TERM GOAL #2   Title Pt will divide attention between at least 1 physical and 1 cognitive task with at least 90% accuracy.    Time 5    Period Weeks    Status New      OT LONG TERM GOAL #3   Title Pt will perform mod complex cooking task mod I.    Time 5    Period Weeks    Status New      OT  LONG TERM GOAL #4   Title Pt will perform complex functional problem solving, organizing, and sequencing tasks with at least 90% accuracy    Time 5    Period Weeks    Status New                 Plan - 03/04/21 1106    Clinical Impression Statement Pt demo some difficulty with alternating attention and problem solving/organization.  Pt reports continued brain fogginess.  Pt has cooked simple meal (baked chicken and corn) but mostly microwaving meals prepared by mother.  Recommended he cook with supervision initially.    OT Occupational Profile and History Detailed Assessment- Review of Records and additional review of physical, cognitive, psychosocial history related to current functional performance    Occupational performance deficits (Please refer to evaluation for details): ADL's;IADL's;Leisure;Work    Games developer / Function / Physical Skills IADL;Strength    Cognitive Skills Attention;Memory;Sequencing;Problem Solve    Rehab Potential Good    Clinical Decision Making Several treatment options, min-mod task modification necessary    Comorbidities Affecting Occupational Performance: May have comorbidities impacting occupational performance    Modification or Assistance to Complete Evaluation  Min-Moderate modification of tasks or assist with assess necessary to complete eval    OT Frequency 2x / week    OT Duration --   for 5 weeks +eval (or 11 visits total)   OT Treatment/Interventions Self-care/ADL training;Therapeutic activities;Ultrasound;Therapeutic exercise;Cognitive remediation/compensation;Neuromuscular education;Cryotherapy;Manual Therapy;Patient/family education    Plan "Organizing your day," alternating/divided attention with cognitive and physical task, memory/cognitive compensation strategies    Consulted and Agree with Plan of Care Patient           Patient will benefit from skilled therapeutic intervention in order to improve the following deficits and  impairments:   Body Structure / Function / Physical Skills: IADL,Strength Cognitive Skills: Attention,Memory,Sequencing,Problem Solve     Visit Diagnosis: Attention and concentration deficit  Muscle weakness (generalized)  Frontal lobe and executive function deficit    Problem List Patient Active Problem List   Diagnosis Date Noted  . Acute respiratory failure with hypoxia and hypercapnia (HCC)   . ICH (intracerebral hemorrhage) (HCC) 02/15/2021  . Pulmonary embolism (HCC) 02/13/2021    [facility] 03/04/2021, 12:23 PM  [facility] [address] [place], [place], [address] Phone: [phone]   Fax:  [phone]  Name: Danny Lowe MRN: [id number] Date of Birth: Nov 09, 1971   Willa Frater, OTR/L [facility] [address] [place], [place]  [address] [phone] phone [phone] 03/04/21 12:23 PM

## 2021-03-04 NOTE — Patient Instructions (Signed)
I had a long discussion with the patient and his sister regarding his recent intracerebral hemorrhage and seizures and discussed risk for recurrence and answered questions.  I recommend he continue Keppra 1500 mg twice daily and the current dose but may consider possible dose reduction in the future if he does well.  Check follow-up CT scan of the head with and without contrast and EEG.  He was encouraged to continue outpatient physical and occupational therapy which is ongoing.  Patient was advised not to return to work till next follow-up visit in 3 months and he left FMLA paperwork for me to be filled.  He was advised not to drive for 6 months as per [facility] and to avoid seizure provoking stimuli like medication noncompliance, stimulants like alcohol, marijuana, substance abuse drugs and extremes of physical exertion and dietary changes.  He will return for follow-up in 3 months.  Seizure, Adult A seizure is a sudden burst of abnormal electrical and chemical activity in the brain. Seizures usually last from 30 seconds to 2 minutes.  What are the causes? Common causes of this condition include:  Fever or infection.  Problems that affect the brain. These may include: ? A brain or head injury. ? Bleeding in the brain. ? A brain tumor.  Low levels of blood sugar or salt.  Kidney problems or liver problems.  Conditions that are passed from parent to child (are inherited).  Problems with a substance, such as: ? Having a reaction to a drug or a medicine. ? Stopping the use of a substance all of a sudden (withdrawal).  A stroke.  Disorders that affect how you develop. Sometimes, the cause may not be known.  What increases the risk?  Having someone in your family who has epilepsy. In this condition, seizures happen again and again over time. They have no clear cause.  Having had a tonic-clonic seizure before. This type of seizure causes you to: ? Tighten the muscles of the whole  body. ? Lose consciousness.  Having had a head injury or strokes before.  Having had a lack of oxygen at birth. What are the signs or symptoms? There are many types of seizures. The symptoms vary depending on the type of seizure you have. Symptoms during a seizure  Shaking that you cannot control (convulsions) with fast, jerky movements of muscles.  Stiffness of the body.  Breathing problems.  Feeling mixed up (confused).  Staring or not responding to sound or touch.  Head nodding.  Eyes that blink, flutter, or move fast.  Drooling, grunting, or making clicking sounds with your mouth  Losing control of when you pee or poop. Symptoms before a seizure  Feeling afraid, nervous, or worried.  Feeling like you may vomit.  Feeling like: ? You are moving when you are not. ? Things around you are moving when they are not.  Feeling like you saw or heard something before (dj vu).  Odd tastes or smells.  Changes in how you see. You may see flashing lights or spots. Symptoms after a seizure  Feeling confused.  Feeling sleepy.  Headache.  Sore muscles. How is this treated? If your seizure stops on its own, you will not need treatment. If your seizure lasts longer than 5 minutes, you will normally need treatment. Treatment may include:  Medicines given through an IV tube.  Avoiding things, such as medicines, that are known to cause your seizures.  Medicines to prevent seizures.  A device to prevent  or control seizures.  Surgery.  A diet low in carbohydrates and high in fat (ketogenic diet). Follow these instructions at home: Medicines  Take over-the-counter and prescription medicines only as told by your doctor.  Avoid foods or drinks that may keep your medicine from working, such as alcohol. Activity  Follow instructions about driving, swimming, or doing things that would be dangerous if you had another seizure. Wait until your doctor says it is safe for  you to do these things.  If you live in the [place] ask your local department of motor vehicles when you can drive.  Get a lot of rest. Teaching others  Teach friends and family what to do when you have a seizure. They should: ? Help you get down to the ground. ? Protect your head and body. ? Loosen any clothing around your neck. ? Turn you on your side. ? Know whether or not you need emergency care. ? Stay with you until you are better.  Also, tell them what not to do if you have a seizure. Tell them: ? They should not hold you down. ? They should not put anything in your mouth.   General instructions  Avoid anything that gives you seizures.  Keep a seizure diary. Write down: ? What you remember about each seizure. ? What you think caused each seizure.  Keep all follow-up visits. Contact a doctor if:  You have another seizure or seizures. Call the doctor each time you have a seizure.  The pattern of your seizures changes.  You keep having seizures with treatment.  You have symptoms of being sick or having an infection.  You are not able to take your medicine. Get help right away if:  You have any of these problems: ? A seizure that lasts longer than 5 minutes. ? Many seizures in a row and you do not feel better between seizures. ? A seizure that makes it harder to breathe. ? A seizure and you can no longer speak or use part of your body.  You do not wake up right after a seizure.  You get hurt during a seizure.  You feel confused or have pain right after a seizure. These symptoms may be an emergency. Get help right away. Call your local emergency services (911 in the [place]  Do not wait to see if the symptoms will go away.  Do not drive yourself to the hospital. Summary  A seizure is a sudden burst of abnormal electrical and chemical activity in the brain. Seizures normally last from 30 seconds to 2 minutes.  Causes of seizures include illness, injury to the  head, low levels of blood sugar or salt, and certain conditions.  Most seizures will stop on their own in less than 5 minutes. Seizures that last longer than 5 minutes are a medical emergency and need treatment right away.  Many medicines are used to treat seizures. Take over-the-counter and prescription medicines only as told by your doctor. This information is not intended to replace advice given to you by your health care provider. Make sure you discuss any questions you have with your health care provider. Document Revised: 05/24/2020 Document Reviewed: 05/24/2020 Elsevier Patient Education  2021 [employer].

## 2021-03-04 NOTE — Patient Instructions (Signed)
Access Code: [email or web address] URL: [email or web address] Date: 03/04/2021 Prepared by: Elmer Bales  Exercises Walking with Head Rotation - 2 x daily - 7 x weekly - 1 sets - 3-4 reps   Also discussed walking 10 min a day to begin.

## 2021-03-04 NOTE — Progress Notes (Signed)
[facility] [address] [place]. [place] [address] [phone]       OFFICE CONSULT NOTE  [name] Date of Birth:  May 11, 1971 Medical Record Number:  [id number]   Referring MD: Marvel Plan  Reason for Referral: Seizures HPI: Danny Lowe is a 50 year old African-American male seen today for initial office consultation visit accompanied by his sister.  History is obtained from them and review of electronic medical records and I personally reviewed available imaging films in PACS. Danny Lowe was admitted on 02/13/2020 to [facility] following a syncopal event during which she fell and hit his shoulder and head on concrete.  On initial admission he was found to have small right parieto-occipital scalp contusion without any underlying fracture and brain hemorrhage on initial CT.  He was found to have unprovoked massive pulmonary embolism with right heart strain and extensive left lower extremity DVT for which he was treated with TPA.  He subsequently discharged on Eliquis on 02/15/2020 when he was stable and able to tolerate physical therapy.  Patient drove to [facility] and while coming out from the had had another syncopal episode inside the store.  He managed to wake up in attempted to go to his car but went to the incorrect car and had another event when he collapsed and had witnessed seizure-like activity.  EMS was called and he was brought to [facility] where he reportedly had another seizure in route.  CT scan showed right temporal and superior cerebellar parenchymal hematoma and Eliquis was reversed using Kcentra.  Patient developed clinical seizure in the hospital with staring ahead unresponsive nonverbal and rhythmic jerking of the left arm and leg with intermittent spread to the right leg.  He was given 2 mg of Ativan without suppression and subsequently he desaturated and became hypotensive requiring intubation and Versed drip for sedation and  seizure control.  He was loaded with Keppra and maintained on 1500 mg twice daily.  Overnight EEG with video on 02/16/2021 showed abnormal continuous slowing in the right temporal region but no seizures.  MRI scan of the brain was obtained which showed stable appearance of the intracerebral hemorrhage with mild cytotoxic edema but no intraventricular extension or midline shift.  MRI of the brain was unremarkable.  2D echo showed severe dilatation hypocontractility in the mid basal right ventricle consistent with right ventricular strain from PE on 02/13/2021.  LDL cholesterol 86 mg percent and hemoglobin A1c was 4.8.  Hypercoagulable labs including lupus anticoagulant, factor V Leiden, prothrombin gene mutation, anticardiolipin antibodies were all negative.  Antithrombin III levels as well as protein C MS total and activity were also normal.  Beta-2 glycoprotein showed isolated elevation of IgG which is of unclear significance.  Patient states is done well since discharge.  Is tolerating Keppra 1500 mg twice daily but it makes him sleepy and tired.  Is not willing to consider a lower dose as he feels it is helped him sleep and does not want to have more seizures.  He does complain of mild subjective feeling of being off balance.  He is just started doing outpatient physical and occupational therapy.  He is living at home with his son.  He also feels he has a little brain fog in the morning and his memory is not as sharp.  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 ROS:   14 system review of systems is positive for  seizures, loss of consciousness, headache, dizziness, imbalance and all other systems negative  PMH:  Past Medical History:  Diagnosis Date  . Depression   . Stroke [facility])     Social History:  Social History   Socioeconomic History  . Marital status: Single    Spouse name: Not on file  . Number of children: Not on file  .  Years of education: Not on file  . Highest education level: Not on file  Occupational History  . Occupation: full time  Tobacco Use  . Smoking status: Never Smoker  . Smokeless tobacco: Current User    Types: Chew  Substance and Sexual Activity  . Alcohol use: Yes    Comment: 12 beers daily  . Drug use: Never  . Sexual activity: Not on file  Other Topics Concern  . Not on file  Social History Narrative   Lives with son   Left Handed   Drinks 6-8 cups caffeine daily   Social Determinants of Health   Financial Resource Strain: Not on file  Food Insecurity: Not on file  Transportation Needs: Not on file  Physical Activity: Not on file  Stress: Not on file  Social Connections: Not on file  Intimate Partner Violence: Not on file    Medications:   Current Outpatient Medications on File Prior to Visit  Medication Sig Dispense Refill  . busPIRone (BUSPAR) 10 MG tablet Take 10 mg by mouth daily. OK to use lower dose (1/2 tablet by mouth 3 times daily) if anxiety is not so bad.    . docusate sodium (COLACE) 100 MG capsule Take 1 capsule (100 mg total) by mouth 2 (two) times daily. 10 capsule 0  . folic acid (FOLVITE) 1 MG tablet Take 1 tablet (1 mg total) by mouth daily. 30 tablet 0  . hydrOXYzine (VISTARIL) 25 MG capsule Take 25-50 mg by mouth See admin instructions. Take 1 capsule (25mg ) by mouth twice daily as needed for anxiety and take 1-2 capsules (25-50mg ) by mouth at bedtime as needed for sleep.    sertraline (ZOLOFT) 25 MG tablet Take 25 mg by mouth daily.    Marland Kitchen thiamine 100 MG tablet Take 1 tablet (100 mg total) by mouth daily. 30 tablet 0  . traZODone (DESYREL) 100 MG tablet Take 100 mg by mouth at bedtime as needed for sleep.    Marland Kitchen docusate sodium (COLACE) 100 MG capsule TAKE 1 CAPSULE (100 MG TOTAL) BY MOUTH TWO TIMES DAILY. 10 capsule 0  . folic acid (FOLVITE) 1 MG tablet TAKE 1 TABLET (1 MG TOTAL) BY MOUTH DAILY. 30 tablet 0  . levETIRAcetam (KEPPRA) 750 MG tablet TAKE  2 TABLETS (1,500 MG TOTAL) BY MOUTH TWO TIMES DAILY. 120 tablet 1  . thiamine 100 MG tablet TAKE 1 TABLET (100 MG TOTAL) BY MOUTH DAILY. 30 tablet 0   No current facility-administered medications on file prior to visit.    Allergies:  No Known Allergies  Physical Exam General: well developed, well nourished middle-aged African-American male, seated, in no evident distress Head: head normocephalic and atraumatic.   Neck: supple with no carotid or supraclavicular bruits Cardiovascular: regular rate and rhythm, no murmurs Musculoskeletal: no deformity Skin:  no rash/petichiae Vascular:  Normal pulses all extremities  Neurologic Exam Mental Status: Awake and fully alert. Oriented to place and time. Recent and remote memory intact. Attention span, concentration and fund of knowledge appropriate. Mood and affect appropriate.  Cranial Nerves: Fundoscopic exam reveals sharp disc margins. Pupils equal,  briskly reactive to light. Extraocular movements full without nystagmus. Visual fields full to confrontation. Hearing intact. Facial sensation intact. Face, tongue, palate moves normally and symmetrically.  Motor: Normal bulk and tone. Normal strength in all tested extremity muscles. Sensory.: intact to touch , pinprick , position and vibratory sensation.  Coordination: Rapid alternating movements normal in all extremities. Finger-to-nose and heel-to-shin performed accurately bilaterally. Gait and Station: Arises from chair without difficulty. Stance is normal. Gait demonstrates normal stride length and balance . Able to heel, toe and tandem walk with  slight difficulty.  Reflexes: 1+ and symmetric. Toes downgoing.   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  Patient is doing  well but has only minor side effects on Keppra     PLAN: I had a long discussion with the patient and his sister regarding his recent intracerebral hemorrhage and seizures and discussed risk for recurrence and answered questions.  I recommend he continue Keppra 1500 mg twice daily and the current dose but may consider possible dose reduction in the future if he does well.  Check follow-up CT scan of the head with and without contrast and EEG.  He was encouraged to continue outpatient physical and occupational therapy which is ongoing.  Patient was advised not to return to work till next follow-up visit in 3 months and he left FMLA paperwork for me to be filled.  He was advised not to drive for 6 months as per [facility] and to avoid seizure provoking stimuli like medication noncompliance, stimulants like alcohol, marijuana, substance abuse drugs and extremes of physical exertion and dietary changes.  He will return for follow-up in 3 months.  Greater than 50% time during this 45-minute consultation visit was spent on counseling and coordination of care about seizures and intracerebral hemorrhage and answering questions. Delia Heady, MD Note: This document was prepared with digital dictation and possible smart phrase technology. Any transcriptional errors that result from this process are unintentional.

## 2021-03-04 NOTE — Therapy (Signed)
[facility] [address] [place], [place], [address] Phone: [phone]   Fax:  [phone]  Physical Therapy Treatment  Patient Details  Name: Danny Lowe MRN: [id number] Date of Birth: 1971/03/06 Referring Provider (PT): referred by Levon Hedger  (hospitalist) and PCP is Horton Marshall   Encounter Date: 03/04/2021   PT End of Session - 03/04/21 1018    Visit Number 2    Number of Visits 3    Authorization Type UHC    PT Start Time 1015    PT Stop Time 1100    PT Time Calculation (min) 45 min    Activity Tolerance Patient tolerated treatment well    Behavior During Therapy [facility] for tasks assessed/performed           Past Medical History:  Diagnosis Date  . Depression   . Stroke [facility])     Past Surgical History:  Procedure Laterality Date  . IR IVC FILTER PLMT / S&I /IMG GUID/MOD SED  02/16/2021    Vitals:   03/04/21 1024  BP: 114/72  Pulse: 68  SpO2: 100%     Subjective Assessment - 03/04/21 1019    Subjective Pt reports that he sees Dr. Pearlean Brownie at 3 today. He reports that he still feels a bit foggy at times. He is having some blurry vision at times and has not seen eye doctor since this occurred. Also reports equilibrium is a little off when first gets up in morning but resides quickly.    Pertinent History PMH: 50 year old male with history of daily alcohol abuse, PTSD (was in [employer]), depression    Patient Stated Goals Feels he is progressing well but wants to get any help he can.    Currently in Pain? No/denies    Pain Onset 1 to 4 weeks ago                             [facility] Adult PT Treatment/Exercise - 03/04/21 1029      Ambulation/Gait   Ambulation/Gait Yes    Ambulation/Gait Assistance 7: Independent    Assistive device None    Gait Pattern Step-through pattern    Gait Comments Pt ambulated on treadmill x 10 min at 2.51mph. HR=102, O2 sat=98% and BP=112/72 after 6 min.       Neuro Re-ed    Neuro Re-ed Details  Dynamic gait activities in hallway: gait with horizontal head turns 40' x 4. Pt reported feeling dizzy with this. Described as feeling of head still moving even though he is not. Like he is on a merry go round. Resided quickly when stopped movement. Gait with vertical head turns 40'x  2 with minimal issues with dizziness here. Marching gait 40' x 2, tandem gait 40' x 2. Standing feet apart with head turns x 10 with pt reporting only slight dizziness compared to when was walking.                  PT Education - 03/04/21 1911    Education Details Gave initial HEP and discussed daily walking starting at 10 min and gradually increasing time.    Person(s) Educated Patient    Methods Explanation;Demonstration;Handout    Comprehension Verbalized understanding;Returned demonstration            PT Short Term Goals - 02/25/21 1217      PT SHORT TERM GOAL #1   Title STGs=LTGs  PT Long Term Goals - 02/25/21 1217      PT LONG TERM GOAL #1   Title Pt will be independent with high leval balance and aerobic training HEP to transition to gym.    Time 3    Period Weeks    Status New    Target Date 03/18/21      PT LONG TERM GOAL #2   Title Pt will be able to ambulate with horizontal head turns scanning environment on varied surfaces independently to transition to busy community spaces.    Time 3    Period Weeks    Status New    Target Date 03/18/21                 Plan - 03/04/21 1913    Clinical Impression Statement Pt had good vital response to gait on treadmill today. Started high level balance activities and pt reporting some dizziness with horizontal head turns with gait. Minimal when performed with static stance.    Personal Factors and Comorbidities Comorbidity 3+    Comorbidities PMH: 50 year old male with history of daily alcohol abuse, PTSD, depression    Examination-Activity Limitations Locomotion Level     Examination-Participation Restrictions Community Activity;Occupation    Stability/Clinical Decision Making Stable/Uncomplicated    Rehab Potential Good    PT Frequency 1x / week    PT Duration 3 weeks    PT Treatment/Interventions ADLs/Self Care Home Management;Neuromuscular re-education;Balance training;Gait training;Stair training;Functional mobility training;Therapeutic activities;Therapeutic exercise    PT Next Visit Plan Further vestibular assessment next visit. If deficits found may need to extend therapy. If not, possible d/c. Add to  high level balance HEP including SLS activities, gait with horizontal head turns. Work on aerobic activity with gait on treadmill while monitoring vitals.    Consulted and Agree with Plan of Care Patient           Patient will benefit from skilled therapeutic intervention in order to improve the following deficits and impairments:  Abnormal gait,Decreased balance  Visit Diagnosis: Other abnormalities of gait and mobility  Unsteadiness on feet     Problem List Patient Active Problem List   Diagnosis Date Noted  . Acute respiratory failure with hypoxia and hypercapnia (HCC)   . ICH (intracerebral hemorrhage) (HCC) 02/15/2021  . Pulmonary embolism (HCC) 02/13/2021    Ronn Melena, PT, DPT, NCS 03/04/2021, 7:17 PM  [facility] [address] [place], [place], [address] Phone: [phone]   Fax:  [phone]  Name: Danny Lowe MRN: [id number] Date of Birth: 1971-10-07

## 2021-03-05 ENCOUNTER — Telehealth: Payer: Self-pay | Admitting: Neurology

## 2021-03-05 DIAGNOSIS — Z0289 Encounter for other administrative examinations: Secondary | ICD-10-CM

## 2021-03-05 NOTE — Telephone Encounter (Signed)
UHC auth: [employer] via uhc website order sent to GI. They will reach out to the patient to schedule.  °

## 2021-03-06 ENCOUNTER — Telehealth: Payer: Self-pay | Admitting: *Deleted

## 2021-03-06 NOTE — Telephone Encounter (Signed)
PT [place] form faxed on 03/06/21

## 2021-03-07 ENCOUNTER — Ambulatory Visit: Payer: 59 | Admitting: Occupational Therapy

## 2021-03-07 ENCOUNTER — Other Ambulatory Visit (HOSPITAL_COMMUNITY): Payer: Self-pay

## 2021-03-07 ENCOUNTER — Other Ambulatory Visit: Payer: Self-pay

## 2021-03-07 DIAGNOSIS — R41840 Attention and concentration deficit: Secondary | ICD-10-CM

## 2021-03-07 DIAGNOSIS — R41844 Frontal lobe and executive function deficit: Secondary | ICD-10-CM

## 2021-03-07 DIAGNOSIS — M6281 Muscle weakness (generalized): Secondary | ICD-10-CM

## 2021-03-07 DIAGNOSIS — R2689 Other abnormalities of gait and mobility: Secondary | ICD-10-CM | POA: Diagnosis not present

## 2021-03-07 NOTE — Therapy (Signed)
[facility] [address] [place], [place], [address] Phone: [phone]   Fax:  [phone]  Occupational Therapy Treatment  Patient Details  Name: Danny Lowe MRN: [id number] Date of Birth: 1971/02/14 Referring Provider (OT): Dr. Vinnie Level. Katrinka Blazing (referral), PCP is Horton Marshall   Encounter Date: 03/07/2021   OT End of Session - 03/07/21 0940    Visit Number 3    Number of Visits 10    Date for OT Re-Evaluation 04/03/21    Authorization Type UHC:  60 visit limit combined, hard max, no auth req.    OT Start Time [phone]    OT Stop Time 1015    OT Time Calculation (min) 38 min    Activity Tolerance Patient tolerated treatment well    Behavior During Therapy WFL for tasks assessed/performed           Past Medical History:  Diagnosis Date  . Depression   . Stroke [facility])     Past Surgical History:  Procedure Laterality Date  . IR IVC FILTER PLMT / S&I /IMG GUID/MOD SED  02/16/2021    There were no vitals filed for this visit.   Subjective Assessment - 03/07/21 0939    Subjective  cramps in his left calf    Pertinent History hospitalized 02/12/21 after syncopal event and hitting his face. He was found to have a right frontal/occipital hematoma.   He was also found to have unprovoked massive PE with right heart strain and extensive left DVT. He was treated with half dose tPA and heparin drip.   He was transitioned to Eliquis 3/18 and was discharged home 3/19 discharged for PE/ DVT found down 2hrs later in parking lot; code stroke-> found with ICH, witnessed seizure, and intubated. IVC filter was placed.   PMH:  history of daily alcohol abuse, PTSD, depression    Patient Stated Goals concerns with return to heavier demands at work--has began FMLA paperwork (some concerns prior to hospitalization), cognitive changes/attention    Currently in Pain? No/denies                  Treatment: Organizing your day  activity number 2:Pt completed with increased time, and only 1 omission, he did not infer that he needed to pick up the film before showing to his friends. Alternating attention task for alternating shapes level 1 on constant therapy, 100% accuracy. Map reading task level 1 on constant therapy for increased attention to detail and sustained attention increased time required, min v.c for enlarging items however pt completed with 100% accuracy, 58.83 secs response time                 OT Long Term Goals - 02/25/21 1529      OT LONG TERM GOAL #1   Title Pt will verbalize understanding of memory compensation strategies.--check goals 04/03/21    Time 5    Period Weeks    Status New      OT LONG TERM GOAL #2   Title Pt will divide attention between at least 1 physical and 1 cognitive task with at least 90% accuracy.    Time 5    Period Weeks    Status New      OT LONG TERM GOAL #3   Title Pt will perform mod complex cooking task mod I.    Time 5    Period Weeks    Status New      OT LONG TERM GOAL #  4   Title Pt will perform complex functional problem solving, organizing, and sequencing tasks with at least 90% accuracy    Time 5    Period Weeks    Status New                 Plan - 03/07/21 0944    Clinical Impression Statement Pt is progressing towards goals with improving attention.    OT Occupational Profile and History Detailed Assessment- Review of Records and additional review of physical, cognitive, psychosocial history related to current functional performance    Occupational performance deficits (Please refer to evaluation for details): ADL's;IADL's;Leisure;Work    Games developer / Function / Physical Skills IADL;Strength    Cognitive Skills Attention;Memory;Sequencing;Problem Solve    Rehab Potential Good    Clinical Decision Making Several treatment options, min-mod task modification necessary    Comorbidities Affecting Occupational Performance: May have  comorbidities impacting occupational performance    Modification or Assistance to Complete Evaluation  Min-Moderate modification of tasks or assist with assess necessary to complete eval    OT Frequency 2x / week    OT Duration --   for 5 weeks +eval (or 11 visits total)   OT Treatment/Interventions Self-care/ADL training;Therapeutic activities;Ultrasound;Therapeutic exercise;Cognitive remediation/compensation;Neuromuscular education;Cryotherapy;Manual Therapy;Patient/family education    Plan alternating/divided attention with cognitive and physical task, memory/cognitive compensation strategies    Consulted and Agree with Plan of Care Patient           Patient will benefit from skilled therapeutic intervention in order to improve the following deficits and impairments:   Body Structure / Function / Physical Skills: IADL,Strength Cognitive Skills: Attention,Memory,Sequencing,Problem Solve     Visit Diagnosis: Attention and concentration deficit  Muscle weakness (generalized)  Frontal lobe and executive function deficit    Problem List Patient Active Problem List   Diagnosis Date Noted  . Acute respiratory failure with hypoxia and hypercapnia (HCC)   . ICH (intracerebral hemorrhage) (HCC) 02/15/2021  . Pulmonary embolism (HCC) 02/13/2021    Tj Kitchings 03/07/2021, 9:45 AM  [facility] [address] [place], [place], [address] Phone: [phone]   Fax:  [phone]  Name: Danny Lowe MRN: [id number] Date of Birth: 02-17-1971

## 2021-03-10 ENCOUNTER — Ambulatory Visit: Payer: 59

## 2021-03-18 ENCOUNTER — Other Ambulatory Visit: Payer: Self-pay

## 2021-03-18 ENCOUNTER — Ambulatory Visit: Payer: 59 | Admitting: Occupational Therapy

## 2021-03-18 DIAGNOSIS — R41844 Frontal lobe and executive function deficit: Secondary | ICD-10-CM

## 2021-03-18 DIAGNOSIS — M6281 Muscle weakness (generalized): Secondary | ICD-10-CM

## 2021-03-18 DIAGNOSIS — R41840 Attention and concentration deficit: Secondary | ICD-10-CM

## 2021-03-18 DIAGNOSIS — R2689 Other abnormalities of gait and mobility: Secondary | ICD-10-CM | POA: Diagnosis not present

## 2021-03-18 NOTE — Therapy (Signed)
[facility] [address] [place], [place], [address] Phone: [phone]   Fax:  [phone]  Occupational Therapy Treatment  Patient Details  Name: Danny Lowe MRN: [id number] Date of Birth: Sep 27, 1971 Referring Provider (OT): Dr. Vinnie Level. Katrinka Blazing (referral), PCP is Horton Marshall   Encounter Date: 03/18/2021   OT End of Session - 03/18/21 0902    Visit Number 4    Number of Visits 10    Date for OT Re-Evaluation 04/03/21    Authorization Type UHC:  60 visit limit combined, hard max, no auth req.    OT Start Time [id number]    OT Stop Time 0925    OT Time Calculation (min) 39 min    Activity Tolerance Patient tolerated treatment well    Behavior During Therapy [facility] for tasks assessed/performed           Past Medical History:  Diagnosis Date  . Depression   . Stroke [facility])     Past Surgical History:  Procedure Laterality Date  . IR IVC FILTER PLMT / S&I /IMG GUID/MOD SED  02/16/2021    There were no vitals filed for this visit.   Subjective Assessment - 03/18/21 0858    Subjective  Pt reports he has been having "brain Freeze " at times    Pertinent History hospitalized 02/12/21 after syncopal event and hitting his face. He was found to have a right frontal/occipital hematoma.   He was also found to have unprovoked massive PE with right heart strain and extensive left DVT. He was treated with half dose tPA and heparin drip.   He was transitioned to Eliquis 3/18 and was discharged home 3/19 discharged for PE/ DVT found down 2hrs later in parking lot; code stroke-> found with ICH, witnessed seizure, and intubated. IVC filter was placed.   PMH:  history of daily alcohol abuse, PTSD, depression    Limitations hx of seizures    Patient Stated Goals concerns with return to heavier demands at work--has began FMLA paperwork (some concerns prior to hospitalization), cognitive changes/attention    Currently in Pain? Yes    Pain  Score 4     Pain Location Head    Pain Descriptors / Indicators Tingling    Pain Type Acute pain    Pain Onset 1 to 4 weeks ago    Pain Frequency Intermittent    Aggravating Factors  thinking activities    Pain Relieving Factors repostioning, rest                   constant therapy: alternating words level 1: 90%, mod v.c initially for first trial,  159.47 avg response time,  Organization activities: Planning for cleaning/ painting apt. Min v.c Meal planning and grocery shopping task for 4 people, pr demonstrates good attention to detail, and estimation of costs, 1 v.c to list menu. Pt reports cooking spaghetti at home with supervision, pt reports difficulties with memory and sequencing. Pt will benefit from practice.             OT Education - 03/18/21 0927    Education Details Memory compensation strategies    Person(s) Educated Patient;Parent(s)    Methods Explanation;Demonstration;Verbal cues;Handout    Comprehension Verbalized understanding;Returned demonstration;Verbal cues required               OT Long Term Goals - 03/18/21 0900      OT LONG TERM GOAL #1   Title Pt will verbalize understanding of memory  compensation strategies.--check goals 04/03/21    Time 5    Period Weeks    Status New      OT LONG TERM GOAL #2   Title Pt will divide attention between at least 1 physical and 1 cognitive task with at least 90% accuracy.    Time 5    Period Weeks    Status New      OT LONG TERM GOAL #3   Title Pt will perform mod complex cooking task mod I.    Time 5    Period Weeks    Status New      OT LONG TERM GOAL #4   Title Pt will perform complex functional problem solving, organizing, and sequencing tasks with at least 90% accuracy    Time 5    Period Weeks    Status New                 Plan - 03/18/21 1540    Clinical Impression Statement Pt is progressing towards goals. Pt verbalized understanding of memory compensation strategies.     OT Occupational Profile and History Detailed Assessment- Review of Records and additional review of physical, cognitive, psychosocial history related to current functional performance    Occupational performance deficits (Please refer to evaluation for details): ADL's;IADL's;Leisure;Work    Games developer / Function / Physical Skills IADL;Strength    Cognitive Skills Attention;Memory;Sequencing;Problem Solve    Rehab Potential Good    Clinical Decision Making Several treatment options, min-mod task modification necessary    Comorbidities Affecting Occupational Performance: May have comorbidities impacting occupational performance    Modification or Assistance to Complete Evaluation  Min-Moderate modification of tasks or assist with assess necessary to complete eval    OT Frequency 2x / week    OT Duration --   for 5 weeks +eval (or 11 visits total)   OT Treatment/Interventions Self-care/ADL training;Therapeutic activities;Ultrasound;Therapeutic exercise;Cognitive remediation/compensation;Neuromuscular education;Cryotherapy;Manual Therapy;Patient/family education    Plan simple cooking task next visit, alternating/divided attention with cognitive and physical task, cognitive compensation strategies    Consulted and Agree with Plan of Care Patient;Family member/caregiver           Patient will benefit from skilled therapeutic intervention in order to improve the following deficits and impairments:   Body Structure / Function / Physical Skills: IADL,Strength Cognitive Skills: Attention,Memory,Sequencing,Problem Solve     Visit Diagnosis: Attention and concentration deficit  Muscle weakness (generalized)  Frontal lobe and executive function deficit    Problem List Patient Active Problem List   Diagnosis Date Noted  . Acute respiratory failure with hypoxia and hypercapnia (HCC)   . ICH (intracerebral hemorrhage) (HCC) 02/15/2021  . Pulmonary embolism (HCC) 02/13/2021     Marybella Ethier 03/18/2021, 9:28 AM Keene Breath, OTR/L [phone] Phone: [phone] 10:29 AM 03/18/21 [facility] [address] [place], [place], [address] Phone: [phone]   Fax:  [phone]  Name: Danny Lowe MRN: [id number] Date of Birth: 25-Jan-1971

## 2021-03-18 NOTE — Patient Instructions (Signed)

## 2021-03-19 ENCOUNTER — Ambulatory Visit
Admission: RE | Admit: 2021-03-19 | Discharge: 2021-03-19 | Disposition: A | Discharge status: Home / Self Care | Payer: 59 | Source: Ambulatory Visit | Attending: Neurology | Admitting: Neurology

## 2021-03-19 DIAGNOSIS — I611 Nontraumatic intracerebral hemorrhage in hemisphere, cortical: Secondary | ICD-10-CM

## 2021-03-19 MED ORDER — IOPAMIDOL (ISOVUE-300) INJECTION 61%: 100.0000 mL | Freq: Once | INTRAVENOUS | Status: DC | PRN

## 2021-03-19 MED ORDER — IOPAMIDOL (ISOVUE-300) INJECTION 61%
75.0000 mL | Freq: Once | INTRAVENOUS | Status: AC | PRN
  Administered 2021-03-19: 75 mL via INTRAVENOUS

## 2021-03-21 ENCOUNTER — Ambulatory Visit: Payer: 59 | Admitting: Occupational Therapy

## 2021-03-21 ENCOUNTER — Other Ambulatory Visit: Payer: Self-pay

## 2021-03-21 DIAGNOSIS — R2689 Other abnormalities of gait and mobility: Secondary | ICD-10-CM | POA: Diagnosis not present

## 2021-03-21 DIAGNOSIS — M6281 Muscle weakness (generalized): Secondary | ICD-10-CM

## 2021-03-21 DIAGNOSIS — R41844 Frontal lobe and executive function deficit: Secondary | ICD-10-CM

## 2021-03-21 DIAGNOSIS — R41840 Attention and concentration deficit: Secondary | ICD-10-CM

## 2021-03-21 NOTE — Therapy (Signed)
[facility] [address] [place], [place], [address] Phone: [phone]   Fax:  [phone]  Occupational Therapy Treatment  Patient Details  Name: Danny Lowe MRN: [id number] Date of Birth: [birth year] Referring Provider (OT): Dr. Vinnie Level. Katrinka Blazing (referral), PCP is Horton Marshall   Encounter Date: 03/21/2021   OT End of Session - 03/21/21 0853    Visit Number 5    Number of Visits 10    Date for OT Re-Evaluation 04/03/21    Authorization Type UHC:  60 visit limit combined, hard max, no auth req.    OT Start Time [phone]    OT Stop Time 0930    OT Time Calculation (min) 40 min    Activity Tolerance Patient tolerated treatment well    Behavior During Therapy WFL for tasks assessed/performed           Past Medical History:  Diagnosis Date  . Depression   . Stroke [facility])     Past Surgical History:  Procedure Laterality Date  . IR IVC FILTER PLMT / S&I /IMG GUID/MOD SED  02/16/2021    There were no vitals filed for this visit.   Subjective Assessment - 03/21/21 0852    Subjective  Pt reports that he had imaging yesterday    Pertinent History hospitalized 02/12/21 after syncopal event and hitting his face. He was found to have a right frontal/occipital hematoma.   He was also found to have unprovoked massive PE with right heart strain and extensive left DVT. He was treated with half dose tPA and heparin drip.   He was transitioned to Eliquis 3/18 and was discharged home 3/19 discharged for PE/ DVT found down 2hrs later in parking lot; code stroke-> found with ICH, witnessed seizure, and intubated. IVC filter was placed.   PMH:  history of daily alcohol abuse, PTSD, depression    Limitations hx of seizures    Patient Stated Goals concerns with return to heavier demands at work--has began FMLA paperwork (some concerns prior to hospitalization), cognitive changes/attention    Currently in Pain? Yes    Pain Score 3      Pain Location Shoulder    Pain Orientation Right    Pain Descriptors / Indicators Aching    Pain Type Acute pain    Pain Onset More than a month ago    Pain Frequency Intermittent    Aggravating Factors  s/p fall    Pain Relieving Factors repostioning                        Treatment: Pt located items and performed simple cooking task in an unfamiliar environment safely modified independently. Pt had to locate the spatula and a plate during the cooking time. Pt turned off the stove and moved the pan to a cool burner without prompting.Therapist recommends that pt gathers all necessary items at home before starting the cooking task. Therapist reinforced memory compensations with patient. Pt reports difficulty recalling whether he has locked the door at home. Pt generated the idea that he will open the blinds near the door after locking the door so that he knows it is locked. Pt to use his watch to be oriented to day and date.  Deductive reasoning puzzles for which vehicle goes with each city, pt completed 100% accurately without v.c. Then deductive reasoning puzzle for race car- increased time required and pt reports it was challenging however he completed accurately without assistance.  OT Long Term Goals - 03/21/21 0909      OT LONG TERM GOAL #1   Title Pt will verbalize understanding of memory compensation strategies.--check goals 04/03/21    Time 5    Period Weeks    Status Achieved      OT LONG TERM GOAL #2   Title Pt will divide attention between at least 1 physical and 1 cognitive task with at least 90% accuracy.    Time 5    Period Weeks    Status On-going      OT LONG TERM GOAL #3   Title Pt will perform mod complex cooking task mod I.    Time 5    Period Weeks    Status On-going   simple completed in clinic  without difficulty, Pt reports making spaghetti at home mod I, pt's son was at home     OT LONG TERM GOAL #4   Title Pt will  perform complex functional problem solving, organizing, and sequencing tasks with at least 90% accuracy    Time 5    Period Weeks    Status On-going                 Plan - 03/21/21 9892    Clinical Impression Statement Pt is progressing towards goals. He demonstrates ability to perform simple cooking task safely in the clinic. He reports cooking spaghetti at home without assistance(Pt's son was at home)    OT Occupational Profile and History Detailed Assessment- Review of Records and additional review of physical, cognitive, psychosocial history related to current functional performance    Occupational performance deficits (Please refer to evaluation for details): ADL's;IADL's;Leisure;Work    Games developer / Function / Physical Skills IADL;Strength    Cognitive Skills Attention;Memory;Sequencing;Problem Solve    Rehab Potential Good    Clinical Decision Making Several treatment options, min-mod task modification necessary    Comorbidities Affecting Occupational Performance: May have comorbidities impacting occupational performance    Modification or Assistance to Complete Evaluation  Min-Moderate modification of tasks or assist with assess necessary to complete eval    OT Frequency 2x / week    OT Duration --   for 5 weeks +eval (or 11 visits total)   OT Treatment/Interventions Self-care/ADL training;Therapeutic activities;Ultrasound;Therapeutic exercise;Cognitive remediation/compensation;Neuromuscular education;Cryotherapy;Manual Therapy;Patient/family education    Plan consider adding more visits as pt has not been seen to frequency, he is only scheduled through next week, coordinate with PT he sees them next visit, alternating attention, complex problem solving and attention    Consulted and Agree with Plan of Care Patient           Patient will benefit from skilled therapeutic intervention in order to improve the following deficits and impairments:   Body Structure / Function  / Physical Skills: IADL,Strength Cognitive Skills: Attention,Memory,Sequencing,Problem Solve     Visit Diagnosis: Attention and concentration deficit  Muscle weakness (generalized)  Frontal lobe and executive function deficit  Other abnormalities of gait and mobility    Problem List Patient Active Problem List   Diagnosis Date Noted  . Acute respiratory failure with hypoxia and hypercapnia (HCC)   . ICH (intracerebral hemorrhage) (HCC) 02/15/2021  . Pulmonary embolism (HCC) 02/13/2021    Danny Lowe 03/21/2021, 9:19 AM  [facility] [address] [place], [place], [address] Phone: [phone]   Fax:  [phone]  Name: Danny Lowe MRN: [id number] Date of Birth: 1971-05-04

## 2021-03-25 ENCOUNTER — Ambulatory Visit: Payer: 59 | Admitting: Occupational Therapy

## 2021-03-26 ENCOUNTER — Ambulatory Visit: Payer: 59

## 2021-03-27 ENCOUNTER — Ambulatory Visit: Payer: 59

## 2021-03-27 ENCOUNTER — Other Ambulatory Visit: Payer: Self-pay

## 2021-03-27 ENCOUNTER — Ambulatory Visit: Payer: 59 | Admitting: Occupational Therapy

## 2021-03-27 ENCOUNTER — Encounter: Payer: Self-pay | Admitting: Occupational Therapy

## 2021-03-27 ENCOUNTER — Telehealth: Payer: Self-pay

## 2021-03-27 DIAGNOSIS — R2689 Other abnormalities of gait and mobility: Secondary | ICD-10-CM

## 2021-03-27 DIAGNOSIS — R42 Dizziness and giddiness: Secondary | ICD-10-CM

## 2021-03-27 DIAGNOSIS — R41840 Attention and concentration deficit: Secondary | ICD-10-CM

## 2021-03-27 DIAGNOSIS — R41844 Frontal lobe and executive function deficit: Secondary | ICD-10-CM

## 2021-03-27 DIAGNOSIS — R2681 Unsteadiness on feet: Secondary | ICD-10-CM

## 2021-03-27 NOTE — Telephone Encounter (Signed)
Dr. Pearlean Brownie, I saw [facility] today for PT. In reassessing him I noted significant changes in his reported dizziness. His Functional Gait Assessment has decreased from 28/30 on eval a month ago to 15/30 today. He was no longer able to tandem walk which I noted he could also do at your last visit. His gait speed is slow and he is very guarded with wider BOS. He told me at his session today that you had advised him to take aspirin at his last visit but I did not see this in your notes. He said he started 325mg  aspirin so I wanted to be sure you wanted him on this. He said he sees you Monday so I wanted to make you aware of these changes as they seem pretty significant since I last saw him 3 weeks ago. I extended him PT and he will do full vestibular assessment next visit. Thanks for your help. Thursday, PT, DPT, NCS

## 2021-03-27 NOTE — Therapy (Signed)
[facility] [address] [place], [place], [address] Phone: [phone]   Fax:  [phone]  Physical Therapy Treatment/Recert  Patient Details  Name: Danny Lowe MRN: [id number] Date of Birth: 1971-03-22 Referring Provider (PT): referred by Ina Homes  (hospitalist) and PCP is Marilynne Drivers   Encounter Date: 03/27/2021   PT End of Session - 03/27/21 1234    Visit Number 3    Number of Visits 15    Date for PT Re-Evaluation 05/26/21    Authorization Type UHC    PT Start Time 3825    PT Stop Time 1234    PT Time Calculation (min) 47 min    Activity Tolerance Patient tolerated treatment well    Behavior During Therapy [facility] for tasks assessed/performed           Past Medical History:  Diagnosis Date  . Depression   . Stroke [facility])     Past Surgical History:  Procedure Laterality Date  . IR IVC FILTER PLMT / S&I /IMG GUID/MOD SED  02/16/2021    There were no vitals filed for this visit.   Subjective Assessment - 03/27/21 1150    Subjective Pt saw Dr. Leonie Man on 03/04/21. He reports that Dr. Leonie Man told him to take an aspirin. He is taking 314m aspirin but is not sure if that is correct dose. MD note does not say anything about it. Pt reports that he is still having dizziness when turning head. He tries to take his time. He tries to avoid bending down and squats more. He has been able to increase his walking pace. Finds that when he turns head one one he tends to veer that way. Still bothered with lights causing headache. He gets up slowly with changing positions. Still having swelling in left leg.    Pertinent History PMH: 50year old male with history of daily alcohol abuse, PTSD (was in [employer], depression    Patient Stated Goals Feels he is progressing well but wants to get any help he can.    Currently in Pain? No/denies    Pain Onset More than a month ago              [facility]  [facility]      Assessment   Medical Diagnosis ICH    Referring Provider (PT) referred by DIna Homes (hospitalist) and PCP is AMarilynne Drivers   Onset Date/Surgical Date 02/12/21      Functional Gait  Assessment   Gait assessed  Yes    Gait Level Surface Walks 20 ft in less than 7 sec but greater than 5.5 sec, uses assistive device, slower speed, mild gait deviations, or deviates 6-10 in outside of the 12 in walkway width.    Change in Gait Speed Able to change speed, demonstrates mild gait deviations, deviates 6-10 in outside of the 12 in walkway width, or no gait deviations, unable to achieve a major change in velocity, or uses a change in velocity, or uses an assistive device.    Gait with Horizontal Head Turns Performs head turns with moderate changes in gait velocity, slows down, deviates 10-15 in outside 12 in walkway width but recovers, can continue to walk.    Gait with Vertical Head Turns Performs task with slight change in gait velocity (eg, minor disruption to smooth gait path), deviates 6 - 10 in outside 12 in walkway width or uses assistive device    Gait and Pivot Turn  Pivot turns safely in greater than 3 sec and stops with no loss of balance, or pivot turns safely within 3 sec and stops with mild imbalance, requires small steps to catch balance.    Step Over Obstacle Is able to step over 2 stacked shoe boxes taped together (9 in total height) without changing gait speed. No evidence of imbalance.    Gait with Narrow Base of Support Ambulates less than 4 steps heel to toe or cannot perform without assistance.    Gait with Eyes Closed Cannot walk 20 ft without assistance, severe gait deviations or imbalance, deviates greater than 15 in outside 12 in walkway width or will not attempt task.    Ambulating Backwards Walks 20 ft, slow speed, abnormal gait pattern, evidence for imbalance, deviates 10-15 in outside 12 in walkway width.    Steps Alternating feet, must use rail.    Total Score 15                Vestibular Assessment - 03/27/21 1158      Oculomotor Exam   Gaze-induced  Absent    Smooth Pursuits Intact    Saccades Hypometric;Overshoots   overshooting with saccades to the left     Oculomotor Exam-Fixation Suppressed    Ocular Alignment WNL    Ocular ROM WNL    Left Head Impulse negative but felt dizzy after    Right Head Impulse negative but felt dizzy after      Visual Acuity   Static line 9    Dynamic line 7   pt reports 5/10 while performing and then 8/10 when stopped     Orthostatics   BP sitting 112/78   left arm   BP standing (after 1 minute) 112/78                    [facility] Adult PT Treatment/Exercise - 03/27/21 1227      Ambulation/Gait   Ambulation/Gait Yes    Ambulation/Gait Assistance 5: Supervision    Ambulation/Gait Assistance Details Pt ambulated outside. Had pt try to work on scanning with looking left/right at times and pt decreased speed significantly and veered to side he was looking at. When turning pt also slowed and had wide BOS throughout with decreased trunk rotation. Pt looking straight ahead with gait.    Ambulation Distance (Feet) 850 Feet    Assistive device None    Gait Pattern Step-through pattern;Wide base of support;Decreased step length - right;Decreased step length - left    Ambulation Surface Level;Unlevel;Indoor;Outdoor;Paved;Grass                  PT Education - 03/27/21 1732    Education Details PT discussed results of testing and recommending extending PT with further vestibular assessment due to change in status. Will also notify Dr. Leonie Man.    Person(s) Educated Patient    Methods Explanation    Comprehension Verbalized understanding            PT Short Term Goals - 02/25/21 1217      PT SHORT TERM GOAL #1   Title STGs=LTGs             PT Long Term Goals - 03/27/21 1734      PT LONG TERM GOAL #1   Title Pt will be independent with high leval balance and aerobic training HEP to  transition to gym.    Baseline PT has just initiated HEP and walking program but change in status with  more dizziness so will continue to add.    Time 3    Period Weeks    Status On-going      PT LONG TERM GOAL #2   Title Pt will be able to ambulate with horizontal head turns scanning environment on varied surfaces independently to transition to busy community spaces.    Baseline 03/27/21 Pt having increased dizziness/unsteadiness with head turns with gait. Supervision today.    Time 3    Period Weeks    Status Not Met              PT Short Term Goals - 03/27/21 1751      PT SHORT TERM GOAL #1   Title Vestibular assessment will be completed.    Time 4    Period Weeks    Status New    Target Date 04/26/21      PT SHORT TERM GOAL #2   Title Pt will report <5/10 dizziness with all activities for improved function.    Baseline 8/10 with turning    Time 4    Period Weeks    Status New    Target Date 04/26/21           PT Long Term Goals - 03/27/21 1734      PT LONG TERM GOAL #1   Title Pt will be independent with high leval balance and aerobic training HEP to transition to gym. (LTGs due 05/26/21)    Baseline PT has just initiated HEP and walking program but change in status with more dizziness so will continue to add.    Time 8    Period Weeks    Status On-going    Target Date 05/26/21      PT LONG TERM GOAL #2   Title Pt will be able to ambulate with horizontal head turns scanning environment on varied surfaces independently to transition to busy community spaces.    Baseline 03/27/21 Pt having increased dizziness/unsteadiness with head turns with gait. Supervision today.    Time 8    Period Weeks    Status On-going      PT LONG TERM GOAL #3   Title Pt will improve FGA from 15 to 28 or more for improved gait safety and balance.    Baseline 03/27/21 15/30    Time 8    Period Weeks    Target Date 05/26/21                Plan - 03/27/21 1737    Clinical  Impression Statement Pt reporting continued dizziness in which he feels unsteady especially with turning head and turns with walking. Checked goals and pt was noted to have decreased gait speed today with difficulty with scanning environment. FGA reassessed and decreased from 28 to 15/30 since evaluation. PT started vestibular assessment with pt reporting dizziness up to 8/10 with quick head turns. No issues with BP noted. He had some impaired saccades and 2 line change on dynamic visual acuity test. PT will recert patient with plan for full vestibular assessment next visit. PT also going to notify Dr. Leonie Man (his neurologist) of changes since eval.    Personal Factors and Comorbidities Comorbidity 3+    Comorbidities PMH: 50 year old male with history of daily alcohol abuse, PTSD, depression    Examination-Activity Limitations Locomotion Level    Examination-Participation Restrictions Community Activity;Occupation    Stability/Clinical Decision Making Stable/Uncomplicated    Rehab Potential Good    PT Frequency 2x / week  followed by 1x/week for 4 weeks.   PT Duration 4 weeks    PT Treatment/Interventions ADLs/Self Care Home Management;Neuromuscular re-education;Balance training;Gait training;Stair training;Functional mobility training;Therapeutic activities;Therapeutic exercise;Canalith Repostioning;Manual techniques;Patient/family education    PT Next Visit Plan Full vestibular assessment next session. I was only able to begin last session. How did visit with Dr. Leonie Man go? I was reaching out to him to let him know of changes in status I noticed today.    Consulted and Agree with Plan of Care Patient           Patient will benefit from skilled therapeutic intervention in order to improve the following deficits and impairments:  Abnormal gait,Decreased balance  Visit Diagnosis: Other abnormalities of gait and mobility  Unsteadiness on feet  Dizziness and giddiness     Problem  List Patient Active Problem List   Diagnosis Date Noted  . Acute respiratory failure with hypoxia and hypercapnia (HCC)   . ICH (intracerebral hemorrhage) ([place]) 02/15/2021  . Pulmonary embolism ([place]) 02/13/2021    Electa Sniff, PT, DPT, NCS 03/27/2021, 5:50 PM  [facility] [address] [place], [place], [address] Phone: [phone]   Fax:  [phone]  Name: Danny Lowe MRN: [id number] Date of Birth: May 05, 1971

## 2021-03-27 NOTE — Therapy (Signed)
[facility] [address] [place], [place], [address] Phone: [phone]   Fax:  [phone]  Occupational Therapy Treatment  Patient Details  Name: Danny Lowe MRN: [id number] Date of Birth: June 15, 1971 Referring Provider (OT): Dr. Vinnie Level. Katrinka Blazing (referral), PCP is Horton Marshall   Encounter Date: 03/27/2021   OT End of Session - 03/27/21 1110    Visit Number 6    Number of Visits 11   (corrected)   Date for OT Re-Evaluation 04/03/21    Authorization Type UHC:  60 visit limit combined, hard max, no auth req.    OT Start Time 1106    OT Stop Time 1145    OT Time Calculation (min) 39 min    Activity Tolerance Patient tolerated treatment well    Behavior During Therapy WFL for tasks assessed/performed           Past Medical History:  Diagnosis Date  . Depression   . Stroke [facility])     Past Surgical History:  Procedure Laterality Date  . IR IVC FILTER PLMT / S&I /IMG GUID/MOD SED  02/16/2021    There were no vitals filed for this visit.   Subjective Assessment - 03/27/21 1106    Subjective  feels like brain freeze throughout the day  (recommended pt discuss with doctor at upcoming appt)--but not painful.  Dizziness is still there and still off balance.  It is progressively getting better.  Pt reports sensitivity to bright lights.    Pertinent History hospitalized 02/12/21 after syncopal event and hitting his face. He was found to have a right frontal/occipital hematoma.   He was also found to have unprovoked massive PE with right heart strain and extensive left DVT. He was treated with half dose tPA and heparin drip.   He was transitioned to Eliquis 3/18 and was discharged home 3/19 discharged for PE/ DVT found down 2hrs later in parking lot; code stroke-> found with ICH, witnessed seizure, and intubated. IVC filter was placed.   PMH:  history of daily alcohol abuse, PTSD, depression    Limitations hx of seizures     Patient Stated Goals concerns with return to heavier demands at work--has began FMLA paperwork (some concerns prior to hospitalization), cognitive changes/attention    Currently in Pain? No/denies    Pain Onset More than a month ago            Discussed progress and continued difficulties.  Pt reports feeling of "brain freeze," light sensitivity which may cause headaches (wearing sunglasses with bright lights), dizziness (particularly when bending down or looking up), drifting with head turns, blurriness with smaller print and mild rash on arm (pt ? If related to anxiety med).--Recommended pt discuss this with MD (appt next week).     Pt reports doing some simple cooking (baked chicken and corn, spaghetti) when son is home, but some difficulty with memory and sequencing and pt does not feel comfortable to make more complex meals.  Discussed plan for pt to try something familiar, but a little more difficult (moderate).  Recommended that pt take time to think through recipe/steps before he begins and get out all needed items before starting cooking.  Recommended that he continues to wait until someone is home prior to cooking.    Pt to choose a meal that he could try that would be a little more difficult.  Pt suggested gumbo.  Pt wrote down all needed ingredients and then step by step directions.  Pt with difficulty (forgot chicken in ingredient list initially, but self corrected), then realized that he should have started broth and cut veggies before making chicken and then realized he forgot steps to cook sausage and shrimp.  During discussion, therapist also discussed that pt forgot [place] dressing and oil in ingredient list.  Pt reports that he feels that this meal is too complex for him right now and "he should've picked a different meal".  Suggested breakfast--pt agrees that this would be easier than gumbo.  Pt to write down ingredients and sequenced steps/directions prior to next session.     Discussed scheduling additional OT visits to frequency (pt currently only scheduled 1 more visit).  Pt agrees, but reports that he forgets things from therapy when he is at home.--Discussed writing down more during session and having a plan to progress at home prior to discharge.  Pt agrees.        OT Long Term Goals - 03/21/21 0909      OT LONG TERM GOAL #1   Title Pt will verbalize understanding of memory compensation strategies.--check goals 04/03/21    Time 5    Period Weeks    Status Achieved      OT LONG TERM GOAL #2   Title Pt will divide attention between at least 1 physical and 1 cognitive task with at least 90% accuracy.    Time 5    Period Weeks    Status On-going      OT LONG TERM GOAL #3   Title Pt will perform mod complex cooking task mod I.    Time 5    Period Weeks    Status On-going   simple completed in clinic  without difficulty, Pt reports making spaghetti at home mod I, pt's son was at home     OT LONG TERM GOAL #4   Title Pt will perform complex functional problem solving, organizing, and sequencing tasks with at least 90% accuracy    Time 5    Period Weeks    Status On-going                 Plan - 03/27/21 1111    Clinical Impression Statement Pt is progressing towards goals, and is able to perform simple cooking tasks, but has not attempted mod complex tasks.  Pt continues to demo difficulty with memory and sequencing.    OT Occupational Profile and History Detailed Assessment- Review of Records and additional review of physical, cognitive, psychosocial history related to current functional performance    Occupational performance deficits (Please refer to evaluation for details): ADL's;IADL's;Leisure;Work    Games developer / Function / Physical Skills IADL;Strength    Cognitive Skills Attention;Memory;Sequencing;Problem Solve    Rehab Potential Good    Clinical Decision Making Several treatment options, min-mod task modification necessary     Comorbidities Affecting Occupational Performance: May have comorbidities impacting occupational performance    Modification or Assistance to Complete Evaluation  Min-Moderate modification of tasks or assist with assess necessary to complete eval    OT Frequency 2x / week    OT Duration --   for 5 weeks +eval (or 11 visits total)   OT Treatment/Interventions Self-care/ADL training;Therapeutic activities;Ultrasound;Therapeutic exercise;Cognitive remediation/compensation;Neuromuscular education;Cryotherapy;Manual Therapy;Patient/family education    Plan pt to write down ingredients and steps to sequence making a typical breakfast for him (grits, pancakes, melon, etc) and bring to next session--discuss and Write down compensation strategies/recommendation for cooking  and give homework;  continue with divided  attention, complex problem solving/sequencing and attention to detail    Consulted and Agree with Plan of Care Patient           Patient will benefit from skilled therapeutic intervention in order to improve the following deficits and impairments:   Body Structure / Function / Physical Skills: IADL,Strength Cognitive Skills: Attention,Memory,Sequencing,Problem Solve     Visit Diagnosis: Attention and concentration deficit  Frontal lobe and executive function deficit    Problem List Patient Active Problem List   Diagnosis Date Noted  . Acute respiratory failure with hypoxia and hypercapnia (HCC)   . ICH (intracerebral hemorrhage) (HCC) 02/15/2021  . Pulmonary embolism (HCC) 02/13/2021    [facility] 03/27/2021, 12:19 PM  [facility] [address] [place], [place], [address] Phone: [phone]   Fax:  [phone]  Name: Danny Lowe MRN: [id number] Date of Birth: 03/02/1971   Willa Frater, OTR/L [facility] [address] [place], [place]  [address] [phone]  phone [phone] 03/27/21 12:19 PM

## 2021-03-28 ENCOUNTER — Ambulatory Visit: Payer: 59 | Admitting: Occupational Therapy

## 2021-03-28 DIAGNOSIS — R2689 Other abnormalities of gait and mobility: Secondary | ICD-10-CM | POA: Diagnosis not present

## 2021-03-28 DIAGNOSIS — R41844 Frontal lobe and executive function deficit: Secondary | ICD-10-CM

## 2021-03-28 DIAGNOSIS — M6281 Muscle weakness (generalized): Secondary | ICD-10-CM

## 2021-03-28 DIAGNOSIS — R41840 Attention and concentration deficit: Secondary | ICD-10-CM

## 2021-03-28 NOTE — Patient Instructions (Signed)
Cook breakfast from your written directions when someone is home this weekend.  Discuss your headaches with Dr. Pearlean Brownie when you see him Monday.  Throw away your old schedule. We added OT visits to updated schedule.

## 2021-03-28 NOTE — Therapy (Signed)
[facility] [address] [place], [place], [address] Phone: [phone]   Fax:  [phone]  Occupational Therapy Treatment  Patient Details  Name: Danny Lowe MRN: [id number] Date of Birth: [birth year] Referring Provider (OT): Dr. Vinnie Level. Katrinka Blazing (referral), PCP is Horton Marshall   Encounter Date: 03/28/2021   OT End of Session - 03/28/21 0853    Visit Number 7    Number of Visits 11   (corrected)   Date for OT Re-Evaluation 04/03/21    Authorization Type UHC:  60 visit limit combined, hard max, no auth req.    OT Start Time 0848    OT Stop Time 0930    OT Time Calculation (min) 42 min    Activity Tolerance Patient tolerated treatment well    Behavior During Therapy WFL for tasks assessed/performed           Past Medical History:  Diagnosis Date  . Depression   . Stroke [facility])     Past Surgical History:  Procedure Laterality Date  . IR IVC FILTER PLMT / S&I /IMG GUID/MOD SED  02/16/2021    There were no vitals filed for this visit.   Subjective Assessment - 03/28/21 0850    Subjective  Pt reports headache today    Pertinent History hospitalized 02/12/21 after syncopal event and hitting his face. He was found to have a right frontal/occipital hematoma.   He was also found to have unprovoked massive PE with right heart strain and extensive left DVT. He was treated with half dose tPA and heparin drip.   He was transitioned to Eliquis 3/18 and was discharged home 3/19 discharged for PE/ DVT found down 2hrs later in parking lot; code stroke-> found with ICH, witnessed seizure, and intubated. IVC filter was placed.   PMH:  history of daily alcohol abuse, PTSD, depression    Limitations hx of seizures    Patient Stated Goals concerns with return to heavier demands at work--has began FMLA paperwork (some concerns prior to hospitalization), cognitive changes/attention    Currently in Pain? Yes    Pain Score 6      Pain Location Head    Pain Descriptors / Indicators Aching;Headache    Pain Type Acute pain    Pain Onset More than a month ago    Pain Frequency Intermittent    Aggravating Factors  bight lights    Pain Relieving Factors unknown                                OT Education - 03/28/21 1228    Education Details Reveiwed patient home work to prepare a moderately complex meal for someone. Pt had written out the steps on his I-pad,  thoroughly overall however pt planned to make too many item to make. Pt was able to pair back his list with min cues. Pt was instructed to try to cook breakfast from his list this weekend with a family member present. Pt reports that the glare fro bight lights bohters his eyes, pt trialed several options but he prefers his sunglasses. Alteranting attention task constant therapy 96% accuracy, 59.23 secs response time.    Person(s) Educated Patient    Methods Explanation;Demonstration;Verbal cues;Handout    Comprehension Verbalized understanding;Returned demonstration;Verbal cues required               OT Long Term Goals - 03/21/21 9417  OT LONG TERM GOAL #1   Title Pt will verbalize understanding of memory compensation strategies.--check goals 04/03/21    Time 5    Period Weeks    Status Achieved      OT LONG TERM GOAL #2   Title Pt will divide attention between at least 1 physical and 1 cognitive task with at least 90% accuracy.    Time 5    Period Weeks    Status On-going      OT LONG TERM GOAL #3   Title Pt will perform mod complex cooking task mod I.    Time 5    Period Weeks    Status On-going   simple completed in clinic  without difficulty, Pt reports making spaghetti at home mod I, pt's son was at home     OT LONG TERM GOAL #4   Title Pt will perform complex functional problem solving, organizing, and sequencing tasks with at least 90% accuracy    Time 5    Period Weeks    Status On-going                  Plan - 03/28/21 9937    Clinical Impression Statement Pt is progressing towards goals. Pt continues to demo difficulty with memory and sequencing.    OT Occupational Profile and History Detailed Assessment- Review of Records and additional review of physical, cognitive, psychosocial history related to current functional performance    Occupational performance deficits (Please refer to evaluation for details): ADL's;IADL's;Leisure;Work    Games developer / Function / Physical Skills IADL;Strength    Cognitive Skills Attention;Memory;Sequencing;Problem Solve    Rehab Potential Good    Clinical Decision Making Several treatment options, min-mod task modification necessary    Comorbidities Affecting Occupational Performance: May have comorbidities impacting occupational performance    Modification or Assistance to Complete Evaluation  Min-Moderate modification of tasks or assist with assess necessary to complete eval    OT Frequency 2x / week    OT Duration --   for 5 weeks +eval (or 11 visits total)   OT Treatment/Interventions Self-care/ADL training;Therapeutic activities;Ultrasound;Therapeutic exercise;Cognitive remediation/compensation;Neuromuscular education;Cryotherapy;Manual Therapy;Patient/family education    Plan discuss and Write down compensation strategies/recommendation for cooking, check to see if pt cooked a meal,  continue with divided attention, complex problem solving/sequencing and attention to detail    Consulted and Agree with Plan of Care Patient           Patient will benefit from skilled therapeutic intervention in order to improve the following deficits and impairments:   Body Structure / Function / Physical Skills: IADL,Strength Cognitive Skills: Attention,Memory,Sequencing,Problem Solve     Visit Diagnosis: Attention and concentration deficit  Frontal lobe and executive function deficit  Muscle weakness (generalized)    Problem List Patient Active Problem  List   Diagnosis Date Noted  . Acute respiratory failure with hypoxia and hypercapnia (HCC)   . ICH (intracerebral hemorrhage) (HCC) 02/15/2021  . Pulmonary embolism (HCC) 02/13/2021    Brizeyda Holtmeyer 03/28/2021, 1:45 PM Keene Breath, OTR/L [phone] Phone: [phone] 1:45 PM 03/28/21  [facility] [address] [place], [place], [address] Phone: [phone]   Fax:  [phone]  Name: Mccormick Macon MRN: [id number] Date of Birth: 1971-02-17

## 2021-03-31 ENCOUNTER — Ambulatory Visit (INDEPENDENT_AMBULATORY_CARE_PROVIDER_SITE_OTHER): Payer: 59 | Admitting: Neurology

## 2021-03-31 ENCOUNTER — Other Ambulatory Visit: Payer: Self-pay

## 2021-03-31 ENCOUNTER — Other Ambulatory Visit (HOSPITAL_COMMUNITY): Payer: Self-pay

## 2021-03-31 DIAGNOSIS — G40009 Localization-related (focal) (partial) idiopathic epilepsy and epileptic syndromes with seizures of localized onset, not intractable, without status epilepticus: Secondary | ICD-10-CM

## 2021-04-02 ENCOUNTER — Ambulatory Visit: Payer: 59 | Attending: Internal Medicine

## 2021-04-02 ENCOUNTER — Ambulatory Visit: Payer: 59 | Admitting: Occupational Therapy

## 2021-04-02 ENCOUNTER — Other Ambulatory Visit: Payer: Self-pay

## 2021-04-02 DIAGNOSIS — R41840 Attention and concentration deficit: Secondary | ICD-10-CM | POA: Insufficient documentation

## 2021-04-02 DIAGNOSIS — R2681 Unsteadiness on feet: Secondary | ICD-10-CM | POA: Insufficient documentation

## 2021-04-02 DIAGNOSIS — R41844 Frontal lobe and executive function deficit: Secondary | ICD-10-CM | POA: Diagnosis present

## 2021-04-02 DIAGNOSIS — M6281 Muscle weakness (generalized): Secondary | ICD-10-CM

## 2021-04-02 DIAGNOSIS — R42 Dizziness and giddiness: Secondary | ICD-10-CM | POA: Diagnosis present

## 2021-04-02 DIAGNOSIS — R2689 Other abnormalities of gait and mobility: Secondary | ICD-10-CM | POA: Diagnosis present

## 2021-04-02 NOTE — Therapy (Signed)
[facility] [address] [place], [place], [address] Phone: [phone]   Fax:  [phone]  Occupational Therapy Treatment  Patient Details  Name: Danny Lowe MRN: [id number] Date of Birth: 1971-08-27 Referring Provider (OT): Dr. Vinnie Level. Katrinka Blazing (referral), PCP is Horton Marshall   Encounter Date: 04/02/2021   OT End of Session - 04/02/21 0939    Visit Number 8    Number of Visits 11    Date for OT Re-Evaluation 04/03/21    Authorization Type UHC:  60 visit limit combined, hard max, no auth req.    OT Start Time [phone]    OT Stop Time 1015    OT Time Calculation (min) 42 min           Past Medical History:  Diagnosis Date  . Depression   . Stroke [facility])     Past Surgical History:  Procedure Laterality Date  . IR IVC FILTER PLMT / S&I /IMG GUID/MOD SED  02/16/2021    There were no vitals filed for this visit.   Subjective Assessment - 04/02/21 0936    Subjective  Pt reports headache today    Pertinent History hospitalized 02/12/21 after syncopal event and hitting his face. He was found to have a right frontal/occipital hematoma.   He was also found to have unprovoked massive PE with right heart strain and extensive left DVT. He was treated with half dose tPA and heparin drip.   He was transitioned to Eliquis 3/18 and was discharged home 3/19 discharged for PE/ DVT found down 2hrs later in parking lot; code stroke-> found with ICH, witnessed seizure, and intubated. IVC filter was placed.   PMH:  history of daily alcohol abuse, PTSD, depression    Limitations hx of seizures    Patient Stated Goals concerns with return to heavier demands at work--has began FMLA paperwork (some concerns prior to hospitalization), cognitive changes/attention    Currently in Pain? Yes    Pain Score 5     Pain Location Head    Pain Orientation Right    Pain Descriptors / Indicators Headache    Pain Type Acute pain    Pain Onset More  than a month ago    Pain Frequency Intermittent    Aggravating Factors  bright lights and PT treatment    Pain Relieving Factors unknown                   Treatment: Discussed pt's cooking activity that he performed for home work. Pt reports organizing  the task and completing without difficulty. Discussed cognitive compensation strategies with patient, he helped to generate them with v.c and he wrote down on his I-pad.  Pt remains dizzy following PT session.  ConstanT therapy: Map reading for attention to detail : 80%, level 3, 89.12 response time.  Alternating words: 88%, 61.73 secs, avg time for increased alternating attention.                   OT Long Term Goals - 03/21/21 0909      OT LONG TERM GOAL #1   Title Pt will verbalize understanding of memory compensation strategies.--check goals 04/03/21    Time 5    Period Weeks    Status Achieved      OT LONG TERM GOAL #2   Title Pt will divide attention between at least 1 physical and 1 cognitive task with at least 90% accuracy.    Time 5  Period Weeks    Status On-going      OT LONG TERM GOAL #3   Title Pt will perform mod complex cooking task mod I.    Time 5    Period Weeks    Status On-going   simple completed in clinic  without difficulty, Pt reports making spaghetti at home mod I, pt's son was at home     OT LONG TERM GOAL #4   Title Pt will perform complex functional problem solving, organizing, and sequencing tasks with at least 90% accuracy    Time 5    Period Weeks    Status On-going                 Plan - 04/02/21 0945    Clinical Impression Statement Pt is progressing towards goals. Pt reports cooking bacon in the oven and eggs on the stove top at home this weekend without issues.    OT Occupational Profile and History Detailed Assessment- Review of Records and additional review of physical, cognitive, psychosocial history related to current functional performance     Occupational performance deficits (Please refer to evaluation for details): ADL's;IADL's;Leisure;Work    Games developer / Function / Physical Skills IADL;Strength    Cognitive Skills Attention;Memory;Sequencing;Problem Solve    Rehab Potential Good    Clinical Decision Making Several treatment options, min-mod task modification necessary    Comorbidities Affecting Occupational Performance: May have comorbidities impacting occupational performance    Modification or Assistance to Complete Evaluation  Min-Moderate modification of tasks or assist with assess necessary to complete eval    OT Frequency 2x / week    OT Duration --   for 5 weeks +eval (or 11 visits total)   OT Treatment/Interventions Self-care/ADL training;Therapeutic activities;Ultrasound;Therapeutic exercise;Cognitive remediation/compensation;Neuromuscular education;Cryotherapy;Manual Therapy;Patient/family education    Plan continue with divided attention, complex problem solving/sequencing and attention to detail    Consulted and Agree with Plan of Care Patient           Patient will benefit from skilled therapeutic intervention in order to improve the following deficits and impairments:   Body Structure / Function / Physical Skills: IADL,Strength Cognitive Skills: Attention,Memory,Sequencing,Problem Solve     Visit Diagnosis: Muscle weakness (generalized)  Other abnormalities of gait and mobility  Attention and concentration deficit  Frontal lobe and executive function deficit  Unsteadiness on feet    Problem List Patient Active Problem List   Diagnosis Date Noted  . Acute respiratory failure with hypoxia and hypercapnia (HCC)   . ICH (intracerebral hemorrhage) (HCC) 02/15/2021  . Pulmonary embolism (HCC) 02/13/2021    Danny Lowe 04/02/2021, 12:50 PM Keene Breath, OTR/L [phone] Phone: [phone] 12:50 PM 04/02/21 [facility] [address] [place], [place], [address] Phone: [phone]   Fax:  [phone]  Name: Danny Lowe MRN: [id number] Date of Birth: 1971-05-30

## 2021-04-02 NOTE — Therapy (Signed)
[facility] [address] [place], [place], [address] Phone: [phone]   Fax:  [phone]  Physical Therapy Treatment  Patient Details  Name: Danny Lowe MRN: [id number] Date of Birth: [birth year] Referring Provider (PT): referred by Levon Hedger  (hospitalist) and PCP is Horton Marshall   Encounter Date: 04/02/2021   PT End of Session - 04/02/21 0846    Visit Number 4    Number of Visits 15    Date for PT Re-Evaluation 05/26/21    Authorization Type UHC    PT Start Time [phone]    PT Stop Time 0930    PT Time Calculation (min) 44 min    Activity Tolerance Patient tolerated treatment well    Behavior During Therapy [facility] for tasks assessed/performed           Past Medical History:  Diagnosis Date  . Depression   . Stroke [facility])     Past Surgical History:  Procedure Laterality Date  . IR IVC FILTER PLMT / S&I /IMG GUID/MOD SED  02/16/2021    There were no vitals filed for this visit.   Subjective Assessment - 04/02/21 0849    Subjective Patient reports they completed EEG at MD visit on Monday. Patient reported that he thought he was going to have a seizure during completion. Reports he did not talk to MD about aspirin dosage.    Pertinent History PMH: 50 year old male with history of daily alcohol abuse, PTSD (was in [employer]), depression    Patient Stated Goals Feels he is progressing well but wants to get any help he can.    Currently in Pain? Yes    Pain Score 3     Pain Location Head    Pain Orientation Right    Pain Descriptors / Indicators Headache;Other (Comment)   feels like brain freeze   Pain Type Acute pain    Pain Onset More than a month ago                   Vestibular Assessment - 04/02/21 0001      Vestibular Assessment   General Observation Intermittent veering to L side with ambulation      Symptom Behavior   Subjective history of current problem Denies hearing changes,  tinnitus, fullness. Reports some blurry vision.    Type of Dizziness  Unsteady with head/body turns;Imbalance;Blurred vision    Frequency of Dizziness occurs daily; intermittent    Duration of Dizziness minutes    Symptom Nature Motion provoked;Intermittent    Aggravating Factors Sit to stand;Turning body quickly;Turning head quickly    Relieving Factors Slow movements    Progression of Symptoms Worse    History of similar episodes None. Reports all of this has occured after seizures.      Oculomotor Exam   Oculomotor Alignment Abnormal   abnormal alignment noted on R eye   Ocular ROM WNL    Spontaneous Absent    Gaze-induced  Absent    Smooth Pursuits --   difficulty maintaining gaze with vertical motion   Saccades Hypometric;Overshoots   increased dizzines/nauseous.   Comment impaired convergence      Oculomotor Exam-Fixation Suppressed    Left Head Impulse --   requested not to retest due to having symptoms after completion at prior visit.   Right Head Impulse requested not to retest due to having symptoms after completion at prior visit.      Vestibulo-Ocular Reflex   VOR 1 Head Only (  x 1 viewing) difficulty maintaining gaze, mild dizziness. felt like he was still moving after completion.    VOR Cancellation Unable to maintain gaze   unabble to maintain gaze with head turn to L     Positional Sensitivities   Sit to Supine Mild dizziness    Supine to Left Side Lightheadedness    Supine to Right Side No dizziness    Supine to Sitting Mild dizziness    Nose to Right Knee No dizziness    Right Knee to Sitting Mild dizziness    Nose to Left Knee No dizziness    Left Knee to Sitting Moderate dizziness    Head Turning x 5 Moderate dizziness   increased symptoms, and unable to complete with Head turn to L   Head Nodding x 5 Mild dizziness   unable to complete 5 reps   Pivot Right in Standing Lightheadedness   increased postural sway   Pivot Left in Standing Moderate dizziness    increased symptoms,especially with pivoting back to neutral position. increased postural sway noted with pivot   Rolling Right No dizziness    Rolling Left Lightheadedness    Positional Sensitivities Comments SLOW cautious movements througohut completion; intermittent rest breaks required due to increased symptoms. Headache reported 6/10 after completion.                    [facility] Adult PT Treatment/Exercise - 04/02/21 0001      Ambulation/Gait   Ambulation/Gait Yes    Ambulation/Gait Assistance 5: Supervision    Ambulation/Gait Assistance Details after vestibular assesment; patient very unsteady requiring close supevision to OT table    Ambulation Distance (Feet) --   clinic distance   Assistive device None    Gait Pattern Step-through pattern;Wide base of support;Decreased step length - right;Decreased step length - left    Ambulation Surface Level;Indoor                  PT Education - 04/02/21 1032    Education Details Purpose of Vestibular Assesment; Findings    Person(s) Educated Patient    Methods Explanation    Comprehension Verbalized understanding            PT Short Term Goals - 03/27/21 1751      PT SHORT TERM GOAL #1   Title Vestibular assessment will be completed.    Time 4    Period Weeks    Status New    Target Date 04/26/21      PT SHORT TERM GOAL #2   Title Pt will report <5/10 dizziness with all activities for improved function.    Baseline 8/10 with turning    Time 4    Period Weeks    Status New    Target Date 04/26/21             PT Long Term Goals - 03/27/21 1734      PT LONG TERM GOAL #1   Title Pt will be independent with high leval balance and aerobic training HEP to transition to gym. (LTGs due 05/26/21)    Baseline PT has just initiated HEP and walking program but change in status with more dizziness so will continue to add.    Time 8    Period Weeks    Status On-going    Target Date 05/26/21      PT LONG TERM  GOAL #2   Title Pt will be able to ambulate with horizontal head turns scanning  environment on varied surfaces independently to transition to busy community spaces.    Baseline 03/27/21 Pt having increased dizziness/unsteadiness with head turns with gait. Supervision today.    Time 8    Period Weeks    Status On-going      PT LONG TERM GOAL #3   Title Pt will improve FGA from 15 to 28 or more for improved gait safety and balance.    Baseline 03/27/21 15/30    Time 8    Period Weeks    Target Date 05/26/21                 Plan - 04/02/21 0915    Clinical Impression Statement Began vestibular assesment today, with patient demonstrating abnormal oculomotor exam. Iincreased symptoms with vertical saccades and VOR. Unwilling to reassess HIT due to increased symptoms at last session. Completed MSQ with patient dmeonstrating significiant dizziness with vertical movements and head/body turns. Patient requiring frequent rest breaks due to nauseous/dizziness with completion of MSQ. Will continue to progress toward all LTGs.    Personal Factors and Comorbidities Comorbidity 3+    Comorbidities PMH: 50 year old male with history of daily alcohol abuse, PTSD, depression    Examination-Activity Limitations Locomotion Level    Examination-Participation Restrictions Community Activity;Occupation    Stability/Clinical Decision Making Stable/Uncomplicated    Rehab Potential Good    PT Frequency 2x / week   followed by 1x/week for 4 weeks.   PT Duration 4 weeks    PT Treatment/Interventions ADLs/Self Care Home Management;Neuromuscular re-education;Balance training;Gait training;Stair training;Functional mobility training;Therapeutic activities;Therapeutic exercise;Canalith Repostioning;Manual techniques;Patient/family education    PT Next Visit Plan Finish MSQ and Assess Positional Testing. Retest HIT if patient will allow. Begin vestibular HEP. Any update from Dr. Pearlean Brownie?    Consulted and Agree with  Plan of Care Patient           Patient will benefit from skilled therapeutic intervention in order to improve the following deficits and impairments:  Abnormal gait,Decreased balance  Visit Diagnosis: Unsteadiness on feet  Muscle weakness (generalized)  Other abnormalities of gait and mobility  Dizziness and giddiness     Problem List Patient Active Problem List   Diagnosis Date Noted  . Acute respiratory failure with hypoxia and hypercapnia (HCC)   . ICH (intracerebral hemorrhage) (HCC) 02/15/2021  . Pulmonary embolism (HCC) 02/13/2021    Tempie Donning, PT, DPT 04/02/2021, 11:02 AM  [facility] [address] [place], [place], [address] Phone: [phone]   Fax:  [phone]  Name: Danny Lowe MRN: [id number] Date of Birth: May 03, 1971

## 2021-04-04 ENCOUNTER — Telehealth: Payer: Self-pay

## 2021-04-04 ENCOUNTER — Ambulatory Visit: Payer: 59 | Admitting: Occupational Therapy

## 2021-04-04 ENCOUNTER — Encounter: Payer: Self-pay | Admitting: Occupational Therapy

## 2021-04-04 ENCOUNTER — Other Ambulatory Visit: Payer: Self-pay

## 2021-04-04 ENCOUNTER — Ambulatory Visit: Payer: 59

## 2021-04-04 DIAGNOSIS — R2681 Unsteadiness on feet: Secondary | ICD-10-CM

## 2021-04-04 DIAGNOSIS — R42 Dizziness and giddiness: Secondary | ICD-10-CM

## 2021-04-04 DIAGNOSIS — R41840 Attention and concentration deficit: Secondary | ICD-10-CM

## 2021-04-04 DIAGNOSIS — R41844 Frontal lobe and executive function deficit: Secondary | ICD-10-CM

## 2021-04-04 DIAGNOSIS — M6281 Muscle weakness (generalized): Secondary | ICD-10-CM

## 2021-04-04 DIAGNOSIS — R2689 Other abnormalities of gait and mobility: Secondary | ICD-10-CM

## 2021-04-04 NOTE — Progress Notes (Signed)
Kindly inform the patient that EEG study was normal

## 2021-04-04 NOTE — Therapy (Signed)
[facility] [address] [place], [place], [address] Phone: [phone]   Fax:  [phone]  Occupational Therapy Treatment  Patient Details  Name: Danny Lowe MRN: [id number] Date of Birth: 11/08/1971 Referring Provider (OT): Dr. Vinnie Level. Katrinka Blazing (referral), PCP is Horton Marshall   Encounter Date: 04/04/2021   OT End of Session - 04/04/21 0832    Visit Number 9    Number of Visits 11    Date for OT Re-Evaluation 04/03/21    Authorization Type U[employer]:  60 visit limit combined, hard max, no auth req.    OT Start Time [phone]    OT Stop Time 0845    OT Time Calculation (min) 39 min           Past Medical History:  Diagnosis Date  . Depression   . Stroke [facility])     Past Surgical History:  Procedure Laterality Date  . IR IVC FILTER PLMT / S&I /IMG GUID/MOD SED  02/16/2021    There were no vitals filed for this visit.   Subjective Assessment - 04/04/21 0809    Subjective  Pt reports that he forgot to call Dr. Pearlean Brownie    Pertinent History hospitalized 02/12/21 after syncopal event and hitting his face. He was found to have a right frontal/occipital hematoma.   He was also found to have unprovoked massive PE with right heart strain and extensive left DVT. He was treated with half dose tPA and heparin drip.   He was transitioned to Eliquis 3/18 and was discharged home 3/19 discharged for PE/ DVT found down 2hrs later in parking lot; code stroke-> found with ICH, witnessed seizure, and intubated. IVC filter was placed.   PMH:  history of daily alcohol abuse, PTSD, depression    Patient Stated Goals concerns with return to heavier demands at work--has began FMLA paperwork (some concerns prior to hospitalization), cognitive changes/attention    Currently in Pain? Yes    Pain Score 5     Pain Location Head    Pain Descriptors / Indicators Aching    Pain Type Acute pain    Pain Onset More than a month ago    Pain Frequency  Intermittent    Aggravating Factors  bright lights    Pain Relieving Factors unknown                 Treatment: Pt reports significant headache today and demonstrates unsteadiness with ambulation. Pt continues to wear sunglasses during treatment as bight lights aggravate his head. Pt forgot to call Dr. Marlis Edelson office, therapist has him write a reminder in his I-Pad Pt discusses overall frustration with his situation. Writing out steps involved in making a complex, non breakfast meal for attention to detail and sequencing, increased time required and min omissions/ v.c . Pt reports he feels good about cooking, but writing out the steps for tasks is more difficult for him. Therapist explained that it was a good task if it was still challenging him and requires him to look at attention to detail/ sequences. Pt. prefers not to focus on sequencing cooking tasks anymore as he feels good about this area. Ambulating while performing category generation, mod difficulty/ min v.c Pt reports it is very difficult for him to ambulate and perform a cognitive task. Pt requires minguard/ close supervision for balance and he does not turn his head during ambulation. After OT, pt was seen by PT and he was not performing as well. (see PT note  for details) Pt placed on hold until after he sees MD.                    OT Long Term Goals - 04/04/21 0843      OT LONG TERM GOAL #1   Title Pt will verbalize understanding of memory compensation strategies.--check goals 04/03/21    Time 5    Period Weeks    Status Achieved      OT LONG TERM GOAL #2   Title Pt will divide attention between at least 1 physical and 1 cognitive task with at least 90% accuracy.    Time 5    Period Weeks    Status On-going      OT LONG TERM GOAL #3   Title Pt will perform mod complex cooking task mod I.    Time 5    Period Weeks    Status Achieved   Pt has made spaghett as well as cooked eggs and bacon at home  without difficulty.     OT LONG TERM GOAL #4   Title Pt will perform complex functional problem solving, organizing, and sequencing tasks with at least 90% accuracy    Time 5    Period Weeks    Status On-going                 Plan - 04/04/21 1355    Clinical Impression Statement Pt is progressing towards goals. He continues to demonstrate cogntive deficits however he has been provided with strategies for these deficits. Pt reports frustration regarding his overall situation. Pt does not appear to be feeling well today and demonstrates mildly increased difficulty/ frustration with cognitve tasks.    OT Occupational Profile and History Detailed Assessment- Review of Records and additional review of physical, cognitive, psychosocial history related to current functional performance    Occupational performance deficits (Please refer to evaluation for details): ADL's;IADL's;Leisure;Work    Games developer / Function / Physical Skills IADL;Strength    Cognitive Skills Attention;Memory;Sequencing;Problem Solve    Rehab Potential Good    Clinical Decision Making Several treatment options, min-mod task modification necessary    Comorbidities Affecting Occupational Performance: May have comorbidities impacting occupational performance    Modification or Assistance to Complete Evaluation  Min-Moderate modification of tasks or assist with assess necessary to complete eval    OT Frequency 2x / week    OT Duration --   for 5 weeks +eval (or 11 visits total)   OT Treatment/Interventions Self-care/ADL training;Therapeutic activities;Ultrasound;Therapeutic exercise;Cognitive remediation/compensation;Neuromuscular education;Cryotherapy;Manual Therapy;Patient/family education    Plan pt placed on hold until he sees MD as he continues to have significant headache and overal decreased performance with PT/OT today.    Consulted and Agree with Plan of Care Patient           Patient will benefit from  skilled therapeutic intervention in order to improve the following deficits and impairments:   Body Structure / Function / Physical Skills: IADL,Strength Cognitive Skills: Attention,Memory,Sequencing,Problem Solve     Visit Diagnosis: Muscle weakness (generalized)  Other abnormalities of gait and mobility  Attention and concentration deficit  Frontal lobe and executive function deficit    Problem List Patient Active Problem List   Diagnosis Date Noted  . Acute respiratory failure with hypoxia and hypercapnia (HCC)   . ICH (intracerebral hemorrhage) (HCC) 02/15/2021  . Pulmonary embolism (HCC) 02/13/2021    Jasdeep Dejarnett 04/04/2021, 1:58 PM  [facility] [address] [place], [place], [address] Phone: [phone]   Fax:  [phone]  Name: Christy Friede MRN: [id number] Date of Birth: 09/01/1971

## 2021-04-04 NOTE — Telephone Encounter (Signed)
Dr. Pearlean Brownie,   Danny Lowe was seen for PT session today. At start of session patient presenting with constant intense headache and dizziness. Attempted to finish vestibular assessment during session today. PT began to lay patient back with Dix-Hallpike for assesment, before supine position could be obtained patient began to have increased redness in bilateral eyes, increased anxiety, and shakiness. Patient also having potential decreased verbal response, but is known to have delay with symptom reporting. Patient also experiencing watering in bilateral eyes. No other signs/symptoms noted. Primary PT Elmer Bales) and I, both agree that the patient continues to demonstrate significant decline. Physical and Occupational Therapy involved in Danny Lowe care agree that this patient needs to be placed on hold for Therapy services until he can be further assessed. I have advised this patient and his mother to call and schedule with you ASAP for reassessment due to continued decline. Patient also continues to report taking 325 mg of Aspirin but still unaware if this is the correct amount, if you could please address. Informed patient and mother to call 911 or report to ED if having any concerning signs/symptoms prior to being seen. Please advise.   Thank you,  Adelfa Koh, PT, DPT  [facility] [address] [place], [place]  [address] Phone:  [phone] Fax:  [phone]

## 2021-04-04 NOTE — Therapy (Signed)
[facility] [address] [place], [place], [address] Phone: [phone]   Fax:  [phone]  Physical Therapy Treatment Arrived - No Charge  Patient Details  Name: Danny Lowe MRN: [id number] Date of Birth: [birth year] Referring Provider (PT): referred by Levon Hedger  (hospitalist) and PCP is Horton Marshall   Encounter Date: 04/04/2021   PT End of Session - 04/04/21 0852    Visit Number 4   arrived - no charge   Number of Visits 15    Date for PT Re-Evaluation 05/26/21    Authorization Type UHC    PT Start Time [id number]    PT Stop Time 0930    PT Time Calculation (min) 44 min    Activity Tolerance Patient tolerated treatment well    Behavior During Therapy [facility] for tasks assessed/performed           Past Medical History:  Diagnosis Date  . Depression   . Stroke [facility])     Past Surgical History:  Procedure Laterality Date  . IR IVC FILTER PLMT / S&I /IMG GUID/MOD SED  02/16/2021    There were no vitals filed for this visit.   Subjective Assessment - 04/04/21 0849    Subjective Patient has still not been in contact with Dr. Pearlean Brownie. Headache has been constant this morning. Current Dizziness 7/10 reported.    Pertinent History PMH: 50 year old male with history of daily alcohol abuse, PTSD (was in [employer]), depression    Patient Stated Goals Feels he is progressing well but wants to get any help he can.    Currently in Pain? Yes    Pain Score 10-Worst pain ever   has increased due to concentration activities with OT session   Pain Location Head    Pain Orientation Left    Pain Descriptors / Indicators Headache    Pain Type Acute pain    Pain Onset Today    Pain Frequency Constant    Aggravating Factors  bright lights    Pain Relieving Factors wearing the sunglasses            At start of session patient with intense headache and dizziness (reports due to concentration activities with prior OT session).  Patient agreeable to finish vestibular assessment that was initiated at prior visit. PT began to assess for BPPV. As PT began to lay patient back with dix-hallpike, before supine position could be obtained patient began to have increased redness in bilateral eyes, increased anxiety, and shakiness. Patient also having decreased verbal response, but is known to have delay with symptom reporting. Patient also experiencing watering in bilateral eyes. No other signs/symptoms noted. Unable to maintain proper position to assess for BPPV. No abnormal eye movements noted with position that was obtained. With assistance of secondary PT, immediately sat patient up into seated position. Patient very fearful and anxious, requesting to not complete further assessment. Patient continues to be fearful and guarded of  Head/neck movement, often turning en bloc.   Patient voiced frustration with therapy staff about medical care receiving, and unable to get answers for the symptoms he is experiencing. PT and secondary PT actively listened to patient's concerns and had extended conversation with patient regarding the concerns, with attempt to de-escalate the patient's frustration. PT educating patient and patient's mother on placing therapy on hold until can be further assessed by neurology. PT to send message to Dr. Pearlean Brownie following today's visit. PT educating if any sudden changes in symptoms, and/or feel that  patient is experiencing a seizure to call 911 or visit ED immediately. Patient and mother verbalizing understanding.        PT Short Term Goals - 03/27/21 1751      PT SHORT TERM GOAL #1   Title Vestibular assessment will be completed.    Time 4    Period Weeks    Status New    Target Date 04/26/21      PT SHORT TERM GOAL #2   Title Pt will report <5/10 dizziness with all activities for improved function.    Baseline 8/10 with turning    Time 4    Period Weeks    Status New    Target Date 04/26/21              PT Long Term Goals - 03/27/21 1734      PT LONG TERM GOAL #1   Title Pt will be independent with high leval balance and aerobic training HEP to transition to gym. (LTGs due 05/26/21)    Baseline PT has just initiated HEP and walking program but change in status with more dizziness so will continue to add.    Time 8    Period Weeks    Status On-going    Target Date 05/26/21      PT LONG TERM GOAL #2   Title Pt will be able to ambulate with horizontal head turns scanning environment on varied surfaces independently to transition to busy community spaces.    Baseline 03/27/21 Pt having increased dizziness/unsteadiness with head turns with gait. Supervision today.    Time 8    Period Weeks    Status On-going      PT LONG TERM GOAL #3   Title Pt will improve FGA from 15 to 28 or more for improved gait safety and balance.    Baseline 03/27/21 15/30    Time 8    Period Weeks    Target Date 05/26/21                  Patient will benefit from skilled therapeutic intervention in order to improve the following deficits and impairments:     Visit Diagnosis: Other abnormalities of gait and mobility  Unsteadiness on feet  Dizziness and giddiness     Problem List Patient Active Problem List   Diagnosis Date Noted  . Acute respiratory failure with hypoxia and hypercapnia (HCC)   . ICH (intracerebral hemorrhage) (HCC) 02/15/2021  . Pulmonary embolism (HCC) 02/13/2021    Tempie Donning, PT, DPT 04/04/2021, 9:39 AM  [facility] [address] [place], [place], [address] Phone: [phone]   Fax:  [phone]  Name: Danny Lowe MRN: [id number] Date of Birth: Jan 21, 1971

## 2021-04-04 NOTE — Progress Notes (Signed)
Kindly inform the patient that CT scan of the head showed resolving old hemorrhages on the right side of the brain on the side in the back with expected changes.  No new or unexpected or worrisome changes

## 2021-04-07 ENCOUNTER — Telehealth: Payer: Self-pay | Admitting: Neurology

## 2021-04-07 ENCOUNTER — Telehealth: Payer: Self-pay

## 2021-04-07 ENCOUNTER — Encounter: Payer: Self-pay | Admitting: Occupational Therapy

## 2021-04-07 ENCOUNTER — Ambulatory Visit: Payer: 59 | Admitting: Physical Therapy

## 2021-04-07 NOTE — Telephone Encounter (Signed)
Kindly inform the patient that EEG study was normal

## 2021-04-07 NOTE — Telephone Encounter (Signed)
-----   Message from Micki Riley, MD sent at 04/04/2021  9:57 AM EDT ----- Danny Lowe inform the patient that CT scan of the head showed resolving old hemorrhages on the right side of the brain on the side in the back with expected changes.  No new or unexpected or worrisome changes

## 2021-04-07 NOTE — Telephone Encounter (Signed)
Called the pt and advised that Dr. Pearlean Brownie has reviewed the CT scan of the head which shows resolving old hemorrhages. Advised the patient there was no new orunexpected or worsening changes.  Also advised the EEG results were normal and there was no indication of seizure like activity. Pt verbalized understanding and had no further questions about the results.   He states that the therapist was sending a message to Dr Pearlean Brownie to discuss his therapy and at this time he was waiting to hear Dr Marlis Edelson thoughts. I advised that I could see where the therapist has sent a message to Dr Pearlean Brownie but that doesn't appear that he has reviewed this yet. (It was sent on Apr 04, 2021)   Kindly inform the patient that EEG study was normal

## 2021-04-07 NOTE — Telephone Encounter (Signed)
We received a voicemail from this patient. He is inquiring about the results from his  EEG on 5/2. If someone could give him a call to discuss same, it would be greatly appreciated. Best number is [phone]

## 2021-04-11 ENCOUNTER — Ambulatory Visit: Payer: 59

## 2021-04-15 ENCOUNTER — Ambulatory Visit: Payer: 59 | Admitting: Occupational Therapy

## 2021-04-15 ENCOUNTER — Ambulatory Visit: Payer: 59

## 2021-04-17 ENCOUNTER — Ambulatory Visit: Payer: 59

## 2021-04-21 ENCOUNTER — Ambulatory Visit: Payer: 59

## 2021-04-21 NOTE — Telephone Encounter (Signed)
Spoke to patient on phone, accepted 945 appointment with Shanda Bumps for 04/22/21.  Aware to arrive at 915.  Patient denied further questions, verbalized understanding and expressed appreciation for the phone call.

## 2021-04-22 ENCOUNTER — Ambulatory Visit: Payer: Self-pay | Admitting: Adult Health

## 2021-04-22 NOTE — Progress Notes (Deleted)
[facility] [address] [place]. [place] [address] [phone]       OFFICE CONSULT NOTE  [name] Date of Birth:  1971/05/06 Medical Record Number:  [id number]   Referring MD: Marvel Plan  Reason for Referral: Seizures HPI:  Today, 04/22/2021, Danny Lowe returns for requested sooner visit due to consistent headache and dizziness and concern of decline during PT sessions.        History provided for reference purposes only Initial consult visit 04/02/2021 Dr. Pearlean Brownie: Danny Lowe is a 50 year old African-American male seen today for initial office consultation visit accompanied by his sister.  History is obtained from them and review of electronic medical records and I personally reviewed available imaging films in PACS. Danny Lowe was admitted on 02/13/2020 to [facility] following a syncopal event during which she fell and hit his shoulder and head on concrete.  On initial admission he was found to have small right parieto-occipital scalp contusion without any underlying fracture and brain hemorrhage on initial CT.  He was found to have unprovoked massive pulmonary embolism with right heart strain and extensive left lower extremity DVT for which he was treated with TPA.  He subsequently discharged on Eliquis on 02/15/2020 when he was stable and able to tolerate physical therapy.  Patient drove to [facility] and while coming out from the had had another syncopal episode inside the store.  He managed to wake up in attempted to go to his car but went to the incorrect car and had another event when he collapsed and had witnessed seizure-like activity.  EMS was called and he was brought to [facility] where he reportedly had another seizure in route.  CT scan showed right temporal and superior cerebellar parenchymal hematoma and Eliquis was reversed using Kcentra.  Patient developed clinical seizure in the hospital with staring ahead unresponsive  nonverbal and rhythmic jerking of the left arm and leg with intermittent spread to the right leg.  He was given 2 mg of Ativan without suppression and subsequently he desaturated and became hypotensive requiring intubation and Versed drip for sedation and seizure control.  He was loaded with Keppra and maintained on 1500 mg twice daily.  Overnight EEG with video on 02/16/2021 showed abnormal continuous slowing in the right temporal region but no seizures.  MRI scan of the brain was obtained which showed stable appearance of the intracerebral hemorrhage with mild cytotoxic edema but no intraventricular extension or midline shift.  MRI of the brain was unremarkable.  2D echo showed severe dilatation hypocontractility in the mid basal right ventricle consistent with right ventricular strain from PE on 02/13/2021.  LDL cholesterol 86 mg percent and hemoglobin A1c was 4.8.  Hypercoagulable labs including lupus anticoagulant, factor V Leiden, prothrombin gene mutation, anticardiolipin antibodies were all negative.  Antithrombin III levels as well as protein C MS total and activity were also normal.  Beta-2 glycoprotein showed isolated elevation of IgG which is of unclear significance.  Patient states is done well since discharge.  Is tolerating Keppra 1500 mg twice daily but it makes him sleepy and tired.  Is not willing to consider a lower dose as he feels it is helped him sleep and does not want to have more seizures.  He does complain of mild subjective feeling of being off balance.  He is just started doing outpatient physical and occupational therapy.  He is living at home with his son.  He also feels he has a little brain  fog in the morning and his memory is not as sharp.  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 ROS:   14 system review of systems is positive for seizures, loss of consciousness, headache, dizziness, imbalance and all other systems  negative  PMH:  Past Medical History:  Diagnosis Date  . Depression   . Stroke [facility])     Social History:  Social History   Socioeconomic History  . Marital status: Single    Spouse name: Not on file  . Number of children: Not on file  . Years of education: Not on file  . Highest education level: Not on file  Occupational History  . Occupation: full time  Tobacco Use  . Smoking status: Never Smoker  . Smokeless tobacco: Current User    Types: Chew  Substance and Sexual Activity  . Alcohol use: Yes    Comment: 12 beers daily  . Drug use: Never  . Sexual activity: Not on file  Other Topics Concern  . Not on file  Social History Narrative   Lives with son   Left Handed   Drinks 6-8 cups caffeine daily   Social Determinants of Health   Financial Resource Strain: Not on file  Food Insecurity: Not on file  Transportation Needs: Not on file  Physical Activity: Not on file  Stress: Not on file  Social Connections: Not on file  Intimate Partner Violence: Not on file    Medications:   Current Outpatient Medications on File Prior to Visit  Medication Sig Dispense Refill  . aspirin 325 MG tablet Take 325 mg by mouth daily.    . busPIRone (BUSPAR) 10 MG tablet Take 10 mg by mouth daily. OK to use lower dose (1/2 tablet by mouth 3 times daily) if anxiety is not so bad.    . docusate sodium (COLACE) 100 MG capsule Take 1 capsule (100 mg total) by mouth 2 (two) times daily. 10 capsule 0  . folic acid (FOLVITE) 1 MG tablet TAKE 1 TABLET (1 MG TOTAL) BY MOUTH DAILY. 30 tablet 0  . hydrOXYzine (VISTARIL) 25 MG capsule Take 25-50 mg by mouth See admin instructions. Take 1 capsule (25mg ) by mouth twice daily as needed for anxiety and take 1-2 capsules (25-50mg ) by mouth at bedtime as needed for sleep.    levETIRAcetam (KEPPRA) 750 MG tablet Take 2 tablets (1,500 mg total) by mouth 2 (two) times daily. 120 tablet 1  . levETIRAcetam (KEPPRA) 750 MG tablet TAKE 2 TABLETS (1,500 MG  TOTAL) BY MOUTH TWO TIMES DAILY. 120 tablet 1  . sertraline (ZOLOFT) 25 MG tablet Take 25 mg by mouth daily.    . traZODone (DESYREL) 100 MG tablet Take 100 mg by mouth at bedtime as needed for sleep.     No current facility-administered medications on file prior to visit.    Allergies:  No Known Allergies  Physical Exam General: well developed, well nourished middle-aged African-American male, seated, in no evident distress Head: head normocephalic and atraumatic.   Neck: supple with no carotid or supraclavicular bruits Cardiovascular: regular rate and rhythm, no murmurs Musculoskeletal: no deformity Skin:  no rash/petichiae Vascular:  Normal pulses all extremities  Neurologic Exam Mental Status: Awake and fully alert. Oriented to place and time. Recent and remote memory intact. Attention span, concentration and fund of knowledge appropriate. Mood and affect appropriate.  Cranial Nerves: Fundoscopic exam reveals sharp disc margins. Pupils equal,  briskly reactive to light. Extraocular movements full without nystagmus. Visual fields full to confrontation. Hearing intact. Facial sensation intact. Face, tongue, palate moves normally and symmetrically.  Motor: Normal bulk and tone. Normal strength in all tested extremity muscles. Sensory.: intact to touch , pinprick , position and vibratory sensation.  Coordination: Rapid alternating movements normal in all extremities. Finger-to-nose and heel-to-shin performed accurately bilaterally. Gait and Station: Arises from chair without difficulty. Stance is normal. Gait demonstrates normal stride length and balance . Able to heel, toe and tandem walk with  slight difficulty.  Reflexes: 1+ and symmetric. Toes downgoing.   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  Patient is doing well but has only minor side effects on Keppra     PLAN:  1.  ICH in setting of Eliquis use -Residual deficits: -CT head 4/20 resolution of ICH   2.  Seizures post stroke -Continue Keppra 1500 mg twice daily -EEG 5/2 no evidence of seizure activity -He was advised not to drive for 6 months as per [facility] and to avoid seizure provoking stimuli like medication noncompliance, stimulants like alcohol, marijuana, substance abuse drugs and extremes of physical exertion and dietary changes.    I had a long discussion with the patient and his sister regarding his recent intracerebral hemorrhage and seizures and discussed risk for recurrence and answered questions.  I recommend he continue Keppra 1500 mg twice daily and the current dose but may consider possible dose reduction in the future if he does well.  Check follow-up CT scan of the head with and without contrast and EEG.  He was encouraged to continue outpatient physical and occupational therapy which is ongoing.  Patient was advised not to return to work till next follow-up visit in 3 months and he left FMLA paperwork for me to be filled.   He will return for follow-up in 3 months.     CC:  [facility] provider: Patient, No Pcp Per (Inactive)   I spent *** minutes of face-to-face and non-face-to-face time with patient.  This included previsit chart review, lab review, study review, order entry, electronic health record documentation, patient education  Ihor Austin, [facility]  [facility] [address] [place], [place] [address]  Phone [phone] Fax [phone] Note: This document was prepared with digital dictation and possible smart phrase technology. Any transcriptional errors that result from this process are unintentional.

## 2021-04-23 ENCOUNTER — Ambulatory Visit: Payer: 59

## 2021-04-29 ENCOUNTER — Ambulatory Visit: Payer: 59

## 2021-05-28 ENCOUNTER — Other Ambulatory Visit: Payer: Self-pay | Admitting: Neurology

## 2021-05-28 DIAGNOSIS — G459 Transient cerebral ischemic attack, unspecified: Secondary | ICD-10-CM

## 2021-06-04 ENCOUNTER — Other Ambulatory Visit: Payer: Self-pay | Admitting: Interventional Radiology

## 2021-06-04 DIAGNOSIS — Z95828 Presence of other vascular implants and grafts: Secondary | ICD-10-CM

## 2021-06-09 ENCOUNTER — Encounter: Payer: Self-pay | Admitting: Occupational Therapy

## 2021-06-09 NOTE — Therapy (Signed)
[facility] [address] [place], [place], [address] Phone: [phone]   Fax:  [phone]  Patient Details  Name: Danny Lowe MRN: [id number] Date of Birth: 1971-10-16 Referring Provider:  No ref. provider found  Encounter Date: 06/09/2021  OCCUPATIONAL THERAPY DISCHARGE SUMMARY  Visits from Start of Care: 9  Current functional level related to goals / functional outcomes:    OT Long Term Goals - 04/04/21 0843       OT LONG TERM GOAL #1   Title Pt will verbalize understanding of memory compensation strategies.--check goals 04/03/21    Time 5    Period Weeks    Status Achieved      OT LONG TERM GOAL #2   Title Pt will divide attention between at least 1 physical and 1 cognitive task with at least 90% accuracy.    Time 5    Period Weeks    Status Not fully met.     OT LONG TERM GOAL #3   Title Pt will perform mod complex cooking task mod I.    Time 5    Period Weeks    Status Achieved   Pt has made spaghett as well as cooked eggs and bacon at home without difficulty.     OT LONG TERM GOAL #4   Title Pt will perform complex functional problem solving, organizing, and sequencing tasks with at least 90% accuracy    Time 5    Period Weeks    Status Not fully met.              Remaining deficits: Cognitive deficits   Education / Equipment: Pt instructed in cognitive compensation strategies for ADLs/IADLs.   Patient agrees to discharge. Patient goals were partially met. Patient is being discharged due to  pt was placed on hold until he followed up with physician for medical changes/concerns; however, pt has not returned to occupational therapy in > 30 days and will be discharged at this time.Marland Kitchen      [facility] 06/09/2021, 2:19 PM  [facility] [address] [place], [place], [address] Phone: [phone]   Fax:   [place], OTR/L [facility] [address] [place], [place]  [address] [phone] phone [phone] 06/09/21 2:19 PM

## 2021-06-10 ENCOUNTER — Ambulatory Visit: Payer: 59 | Admitting: Adult Health

## 2021-06-12 ENCOUNTER — Ambulatory Visit: Payer: 59 | Admitting: Neurology

## 2021-06-12 ENCOUNTER — Encounter: Payer: Self-pay | Admitting: Neurology

## 2021-06-19 ENCOUNTER — Encounter: Payer: Self-pay | Admitting: *Deleted

## 2021-06-19 ENCOUNTER — Ambulatory Visit
Admission: RE | Admit: 2021-06-19 | Discharge: 2021-06-19 | Disposition: A | Discharge status: Home / Self Care | Payer: 59 | Source: Ambulatory Visit | Attending: Interventional Radiology | Admitting: Interventional Radiology

## 2021-06-19 ENCOUNTER — Other Ambulatory Visit: Payer: Self-pay

## 2021-06-19 DIAGNOSIS — Z95828 Presence of other vascular implants and grafts: Secondary | ICD-10-CM

## 2021-06-19 HISTORY — PX: IR RADIOLOGIST EVAL & MGMT: IMG5224

## 2021-06-19 NOTE — Progress Notes (Signed)
Patient ID: Danny Lowe, male   DOB: [birth year], 50 y.o.   MRN: [id number]       Chief Complaint: Patient was seen in consultation today for presence of IVC filter at the request of Darcy Barbara  Referring Physician(s): Selmer Dominion, NP  History of Present Illness: Danny Lowe is a 50 y.o. male known to our service from recent hospitalization for  acute intracranial hemorrhage.  He initially presented with shortness of breath and a fall and PE was diagnosed on CTA chest.  He was diagnosed with concurrent extensive left lower extremity DVT, and because of contraindications to anticoagulation, caval filtration was requested.  Retrievable IVC filter was placed 02/16/2021.Marland Kitchen  He was discharged and has undergone outpatient  neuro rehabilitation.  He is currently completely ambulatory.  No chest pain or shortness of breath.  Occasional medial left thigh pain.  No lower extremity swelling.  Past Medical History:  Diagnosis Date   Depression    Stroke [facility])     Past Surgical History:  Procedure Laterality Date   IR IVC FILTER PLMT / S&I /IMG GUID/MOD SED  02/16/2021    Allergies: Patient has no known allergies.  Medications: Prior to Admission medications   Medication Sig Start Date End Date Taking? Authorizing Provider  aspirin 325 MG tablet Take 325 mg by mouth daily.    [provider]  busPIRone (BUSPAR) 10 MG tablet Take 10 mg by mouth daily. OK to use lower dose (1/2 tablet by mouth 3 times daily) if anxiety is not so bad.    [provider]  docusate sodium (COLACE) 100 MG capsule Take 1 capsule (100 mg total) by mouth 2 (two) times daily. 02/20/21   Lanae Boast, MD  folic acid (FOLVITE) 1 MG tablet TAKE 1 TABLET (1 MG TOTAL) BY MOUTH DAILY. 02/20/21 02/20/22  Lanae Boast, MD  hydrOXYzine (VISTARIL) 25 MG capsule Take 25-50 mg by mouth See admin instructions. Take 1 capsule (25mg ) by mouth twice daily as needed for anxiety and take 1-2 capsules (25-50mg ) by  mouth at bedtime as needed for sleep.    [provider]  levETIRAcetam (KEPPRA) 750 MG tablet Take 2 tablets (1,500 mg total) by mouth 2 (two) times daily. 03/04/21 04/03/21  06/03/21, MD  levETIRAcetam (KEPPRA) 750 MG tablet TAKE 2 TABLETS (1,500 MG TOTAL) BY MOUTH TWO TIMES DAILY. 02/20/21 02/20/22  02/22/22, MD  sertraline (ZOLOFT) 25 MG tablet Take 25 mg by mouth daily.    [provider]  traZODone (DESYREL) 100 MG tablet Take 100 mg by mouth at bedtime as needed for sleep.    [provider]     No family history on file.  Social History   Socioeconomic History   Marital status: Single    Spouse name: Not on file   Number of children: Not on file   Years of education: Not on file   Highest education level: Not on file  Occupational History   Occupation: full time  Tobacco Use   Smoking status: Never   Smokeless tobacco: Current    Types: Chew  Substance and Sexual Activity   Alcohol use: Yes    Comment: 12 beers daily   Drug use: Never   Sexual activity: Not on file  Other Topics Concern   Not on file  Social History Narrative   Lives with son   Left Handed   Drinks 6-8 cups caffeine daily   Social Determinants of Health   Financial Resource Strain: Not  on file  Food Insecurity: Not on file  Transportation Needs: Not on file  Physical Activity: Not on file  Stress: Not on file  Social Connections: Not on file    ECOG Status: 0 - Asymptomatic  Review of Systems: A 12 point ROS discussed and pertinent positives are indicated in the HPI above.  All other systems are negative.  Review of Systems  Vital Signs: There were no vitals taken for this visit.  Physical Exam Constitutional: Oriented to person, place, and time. Well-developed and well-nourished. No distress.   HENT:  Head: Normocephalic and atraumatic.  Eyes: Conjunctivae and EOM are normal. Right eye exhibits no discharge. Left eye exhibits no discharge. No scleral  icterus.  Neck: No JVD present.  Pulmonary/Chest: Effort normal. No stridor. No respiratory distress.  Abdomen: soft, non distended Neurological:  alert and oriented to person, place, and time.  Skin: Skin is warm and dry.  not diaphoretic.  Psychiatric:   normal mood and affect.   behavior is normal. Judgment and thought content normal.   Mallampati Score:     Imaging: CLINICAL DATA: History of pulmonary embolism and left lower extremity DVT, new intracranial hemorrhage. Caval filtration requested. Cervical collar.  EXAM: INFERIOR VENACAVOGRAM  IVC FILTER PLACEMENT UNDER FLUOROSCOPY  FLUOROSCOPY TIME: 5 minutes 6 seconds; 52 mGy  TECHNIQUE: A right femoral approach was selected. Patency of the right common femoral vein was confirmed with ultrasound with image documentation. An appropriate skin site was determined. Skin site was marked, prepped with chlorhexidine, and draped using maximum barrier technique. The region was infiltrated locally with 1% lidocaine. Under real-time ultrasound guidance, the right common femoral vein was accessed with a 21 gauge micropuncture needle; the needle tip within the vein was confirmed with ultrasound image documentation. The needle was exchanged over a 018 guidewire for a transitional dilator, which allow advancement of the [facility] wire into the IVC. A long 6 French vascular sheath was placed for inferior venacavography. This demonstrated no caval thrombus. 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 Accordingly, Patency of the right IJ vein was confirmed with ultrasound with image documentation. An appropriate skin site was determined. Skin site was marked, prepped with chlorhexidine, and draped using maximum barrier technique. The region was infiltrated locally with 1% lidocaine. Under real-time ultrasound guidance, the right IJ  vein was accessed with a 21 gauge micropuncture needle; the needle tip within the vein was confirmed with ultrasound image documentation. The needle was exchanged over a 018 guidewire for a transitional dilator, which allow advancement of the [facility] wire into the IVC. A long 6 French vascular sheath was placed to allow deployment of the Celect filter at the L2-3 level. Due to significant filter tilt with tip apposition to the lateral wall of the IVC, the delivery sheath was exchanged for a filter retrieval sheath in the snare device was used to recapture the filter and subsequently to redeployed slightly more inferiorly in the IVC at the level of the L3-4 interspace with improved angulation.  Followup cavagram demonstrates stable filter position and no evident complication. The sheath was removed and hemostasis achieved at the site. No immediate complication.  IMPRESSION: 1. Mega cava. No thrombus. 2. Technically successful infrarenal IVC filter placement. This is a retrievable model.  PLAN: This IVC filter is potentially retrievable. The patient will be assessed for filter retrieval by Interventional Radiology in approximately 8-12 weeks. Further  recommendations regarding filter retrieval, continued surveillance or declaration of device permanence, will be made at that time.   Electronically Signed By: Corlis Leak M.D. On: 02/16/2021 13:56   EXAM: BILATERAL LOWER EXTREMITY VENOUS DOPPLER ULTRASOUND  TECHNIQUE: Gray-scale sonography with compression, as well as color and duplex ultrasound, were performed to evaluate the deep venous system(s) from the level of the common femoral vein through the popliteal and proximal calf veins.  COMPARISON: None.  FINDINGS: VENOUS  On the left, there is some eccentric mixed-attenuation mural thickening in the proximal femoral vein with mild narrowing of the lumen over short segment. The remainder of the left lower extremity deep  venous system is normal with resolution of the previously identified acute DVT. Visualized portions of the great saphenous vein unremarkable.  On the right, normal compressibility of the common femoral, superficial femoral, and popliteal veins, as well as the visualized calf veins. Visualized portions of profunda femoral vein and great saphenous vein unremarkable. No filling defects to suggest DVT on grayscale or color Doppler imaging. Doppler waveforms show normal direction of venous flow, normal respiratory phasicity and response to augmentation.  Limited views of the contralateral common femoral vein are unremarkable.  OTHER  None.  Limitations: none  IMPRESSION: 1. Residual post thrombotic change in the proximal left femoral vein. No evidence of acute or superimposed DVT. 2. Negative for right lower extremity DVT.    Labs:  CBC: Recent Labs    02/17/21 0903 02/18/21 0105 02/19/21 0910 02/20/21 0351  WBC 9.1 6.1 6.6 7.6  HGB 15.2 14.0 14.8 14.7  HCT 45.3 42.0 43.2 44.3  PLT 150 164 208 232    COAGS: Recent Labs    02/12/21 2358 02/13/21 0624 02/15/21 1510 02/16/21 0138  INR 1.2 1.8* 1.3* 1.1  APTT 21* 48* 24  --     BMP: Recent Labs    02/17/21 0903 02/18/21 0105 02/19/21 0910 02/20/21 0351  NA 134* 136 136 137  K 3.9 3.4* 3.6 3.5  CL 102 105 104 103  CO2 25 24 27 28   GLUCOSE 90 87 131* 99  BUN 8 9 5* 5*  CALCIUM 8.1* 8.1* 8.6* 8.5*  CREATININE 1.19 1.15 1.25* 1.15  GFRNONAA >60 >60 >60 >60    LIVER FUNCTION TESTS: Recent Labs    02/15/21 0143 02/15/21 1510 02/19/21 0910 02/20/21 0351  BILITOT 0.8 0.6 1.0 0.7  AST 37 40 27 25  ALT 32 34 23 21  ALKPHOS 39 40 32* 36*  PROT 5.6* 7.2 6.4* 6.5  ALBUMIN 2.3* 3.0* 2.6* 2.5*    TUMOR MARKERS: No results for input(s): AFPTM, CEA, CA199, CHROMGRNA in the last 8760 hours.  Assessment and Plan:  My impression is that this patient has done well post IVC filter placement for left lower  extremity DVT.  His acute DVT has virtually entirely resolved.  He has focal residual post thrombotic change in the proximal left femoral vein, possibly source of his occasional pain.  He is  not on anticoagulation.  We discussed the pros and cons of continued caval filtration.   We reviewed briefly the pathophysiology of pulmonary thromboembolic disease.  We reviewed the structure and function of modern IVC filters and the role in the setting of contraindications to or failure of anticoagulation.  We reviewed possible risks and complications of long-term caval filtration including filter fracture, caval wall and adjacent organ perforation, and fragment migration as well as caval thrombosis.   We elected to leave the filter in place for  total of 6 months.  We will get a follow-up lower extremity ultrasound then, and probably remove the filter  subsequently, assuming all things look good.  He is very active and I recommend continued full physical activity which optimize his circulation in his lower extremities.  He  seemed understand, and asked appropriate questions which were answered.      Thank you for this interesting consult.  I greatly enjoyed meeting Caileb Rhue and look forward to participating in their care.  A copy of this report was sent to the requesting provider on this date.  Electronically Signed: Durwin Glaze 06/19/2021, 2:41 PM   I spent a total of    15 Minutes in face to face in clinical consultation, greater than 50% of which was counseling/coordinating care for presence of IVC filter.

## 2021-07-29 ENCOUNTER — Encounter: Payer: Self-pay | Admitting: Neurology

## 2021-07-29 ENCOUNTER — Ambulatory Visit (INDEPENDENT_AMBULATORY_CARE_PROVIDER_SITE_OTHER): Payer: 59 | Admitting: Neurology

## 2021-07-29 ENCOUNTER — Other Ambulatory Visit: Payer: Self-pay

## 2021-07-29 VITALS — BP 117/87 | HR 73 | Ht 69.0 in | Wt 198.2 lb

## 2021-07-29 DIAGNOSIS — G40009 Localization-related (focal) (partial) idiopathic epilepsy and epileptic syndromes with seizures of localized onset, not intractable, without status epilepticus: Secondary | ICD-10-CM

## 2021-07-29 DIAGNOSIS — F0781 Postconcussional syndrome: Secondary | ICD-10-CM

## 2021-07-29 DIAGNOSIS — I611 Nontraumatic intracerebral hemorrhage in hemisphere, cortical: Secondary | ICD-10-CM

## 2021-07-29 MED ORDER — LEVETIRACETAM 750 MG PO TABS: ORAL_TABLET | ORAL | 0 refills | Status: DC

## 2021-07-29 MED ORDER — ZONISAMIDE 100 MG PO CAPS: ORAL_CAPSULE | ORAL | 6 refills | Status: DC

## 2021-07-29 NOTE — Progress Notes (Signed)
NEUROLOGY CONSULTATION NOTE  Danny Lowe MRN: [id number] DOB: 1971/02/28  Referring provider: Dr. Delia Heady Primary care provider: Horton Marshall, PA  Reason for consult:  seizure, transfer of care  Dear Dr Pearlean Brownie:  Thank you for your kind referral of Danny Lowe for consultation of the above symptoms. Although his history is well known to you, please allow me to reiterate it for the purpose of our medical record. He is alone in the office today. Records and images were personally reviewed where available.   HISTORY OF PRESENT ILLNESS: This is a 50 year old left-handed man presenting for transfer of care. Records from hospital admissions and neurologist Dr. Pearlean Brownie were reviewed. He has a history of alcohol use disorder (2 40-oz beer or 12-pack a day), PTSD, depression, admitted to [facility] on 02/12/21 to 02/15/21 after 3 syncopal episodes earlier in the day, at least one of which he hit his face. He recalls feeling strange, he got up and "something took over me and threw me to the floor." His son found him unresponsive with shaking, eyes open, gurgling and called EMS because he had 2 episodes. He felt like he was separated from himself and declined ambulance initially but then he after he had another one, he was brought to the ER. He was found hypotensive with SBP in the 80s, SpO2 in 80s. Head CT on admission did not show any acute changes, there was a small right parieto-occipital scalp contusion. He was found to have extensive acute pulmonary emboli involving the mainstem and proximal lobar pulmonary arteries with right heart strain, probably filling defect within the visualized IVC concerning for thrombus, extensive left lower extremity DVT. He was treated with heparin drip and given IV-tPA (reduced dose in setting of mild thrombocytopenia and head trauma with scalp contusion), then transitioned Eliquis that "likely will need lifelong." He was discharged ~ 1200 on 3/19 and went to [facility]  for his Eliquis prescription when he felt strange. He recalls walking out of [facility] then waking up in the hospital 3 days later. ER notes indicate he was found down in the parking lot at 1500 hours with left-sided weakness. Pharmacist reported he had a syncopal episode in the store, awoke and exited, attempted to go to the incorrect car then had a convulsion. EMS reported seizure-like activity en route with worsening mental status, he had another seizure in the CT scanner, given 4mg  IV Ativan. Head CT showed right temporal and right cerebellar ICH. He was unresponsive with rhythmic shaking of the left arm and leg, intubated for airway protection and Eliquis was reversed. Initial EEG on sedation showed diffuse slowing with focal slowing and LPDs in the right temporal region. Repeat EEG on extubation showed slowing in the right temporal>central region and right temporal sharp waves. Brain MRI without contrast on 02/16/21 showed 3 hematomas (lateral right cerebellum and 2 in the right lateral temporal lobe with surrounding edema), negative MRA for underlying vascular malformation. He underwent IVC filter placement on 02/16/21 with recommendation to resume anticoagulation in 4 weeks after ICH resolves. He was also found to have a right UE DVT on 02/18/21. He was seen as an outpatient by Dr. 02/20/21 on 03/04/21, hypercoagulable panel was normal. Beta-2 glycoprotein showed isolated elevation of IgG which is of unclear significance. He was on Levetiracetam 1500mg  BID making him tired and sleepy. He also reported headache, feeling off balance, brain fog, memory not as sharp. Follow-up head CT and EEG were ordered. I personally reviewed head CT without contrast  done 03/19/21 which did not show any acute changes, there was no further hemorrhage seen. There were hypodensities/encephalomalacia in the right cerebellum and right temporal lobe. EEG on 04/01/21 was normal.   He reports that since then, he had a small seizure last 04/07/21  while doing vestibular therapy. He had been reporting problems with equilibrium when turning his head, and as therapy was done, he felt it coming on with coldness, his left hand started trembling, right leg started trembling uncontrollably and he began to weep but did not know why he was weeping. It lasted 2 minutes. None since then. His 52 year old son lives with him, he has mentioned staring episodes, last one reported was last month. He has also noticed he loses track of time, going blank. No olfactory/gustatory hallucinations, myoclonic jerks. He has had daily headaches since the seizure, lasting all day, "I have headaches all the time." Since he has not been able to take Eliquis, he states he was instructed to take a daily aspirin. He does not take any other rescue medications. He is wearing sunglasses in the exam room, lights make his head hurt. He had head trauma in the [facility] in 1997/03/26 and had headaches but "not daily like this." He has dizziness with headaches and motion. Sometimes he has pain in his stomach like he was "kicked in the gonads." No diplopia, dysarthria/dysphagia. He does not sleep well, usually getting 3 hours of sleep, which is new. He has a history of insomnia, but in the past would sleep up to 5 hours with Trazodone (not helping any longer). He has daytime drowsiness. He has been having visual hallucinations, he saw his deceased grandmother in 2023/03/27. Last May, he saw his son coming our of the room and walk to the kitchen, then minutes later he came through the side door. He feels like something is crawling on him, like a spider. He has noticed his reaction time is slower, evaluating it it is real or not. Around three times a week, he sees things from the corner of his eye, like a bug. The bigger hallucinations have occurred 4-5 times. He has not been driving. He states he is taking Levetiracetam 750mg  BID, that this is the dose prescribed. He is worried it is causing intermittent rash in his  chest, arms, legs. He has a history of mild hand tremors since he got out of the in 03-27-03, affecting eating, writing, making him feels embarrassed. He had a fall 3 weeks ago, no loss of consciousness. He has lost weight and lost his appetite. 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    PAST MEDICAL HISTORY: Past Medical History:  Diagnosis Date   Depression    Stroke [facility])     PAST SURGICAL HISTORY: Past Surgical History:  Procedure Laterality Date   IR IVC FILTER PLMT / S&I /IMG GUID/MOD SED  02/16/2021   IR RADIOLOGIST EVAL & MGMT  06/19/2021    MEDICATIONS: Current Outpatient Medications on File Prior to Visit  Medication Sig Dispense Refill   aspirin 325 MG tablet Take 325 mg by mouth daily.     busPIRone (BUSPAR) 10 MG tablet Take 10 mg by mouth daily. OK to use lower dose (1/2 tablet by mouth 3 times daily) if anxiety is not so bad.     docusate sodium (COLACE) 100 MG capsule Take 1 capsule (100 mg total) by mouth 2 (two) times daily. 10 capsule  0   folic acid (FOLVITE) 1 MG tablet TAKE 1 TABLET (1 MG TOTAL) BY MOUTH DAILY. 30 tablet 0   hydrOXYzine (VISTARIL) 25 MG capsule Take 25-50 mg by mouth See admin instructions. Take 1 capsule (25mg ) by mouth twice daily as needed for anxiety and take 1-2 capsules (25-50mg ) by mouth at bedtime as needed for sleep.     levETIRAcetam (KEPPRA) 750 MG tablet Take 2 tablets (1,500 mg total) by mouth 2 (two) times daily. 120 tablet 1   levETIRAcetam (KEPPRA) 750 MG tablet TAKE 2 TABLETS (1,500 MG TOTAL) BY MOUTH TWO TIMES DAILY. 120 tablet 1   sertraline (ZOLOFT) 25 MG tablet Take 25 mg by mouth daily.     traZODone (DESYREL) 100 MG tablet Take 100 mg by mouth at bedtime as needed for sleep.     No current facility-administered medications on file prior to visit.    ALLERGIES: No Known Allergies  FAMILY HISTORY: No family history on file.  SOCIAL HISTORY: Social History    Socioeconomic History   Marital status: Single    Spouse name: Not on file   Number of children: Not on file   Years of education: Not on file   Highest education level: Not on file  Occupational History   Occupation: full time  Tobacco Use   Smoking status: Never   Smokeless tobacco: Current    Types: Chew  Substance and Sexual Activity   Alcohol use: Yes    Comment: 12 beers daily   Drug use: Never   Sexual activity: Not on file  Other Topics Concern   Not on file  Social History Narrative   Lives with son   Left Handed   Drinks 6-8 cups caffeine daily   Social Determinants of Health   Financial Resource Strain: Not on file  Food Insecurity: Not on file  Transportation Needs: Not on file  Physical Activity: Not on file  Stress: Not on file  Social Connections: Not on file  Intimate Partner Violence: Not on file     PHYSICAL EXAM: Vitals:   07/29/21 1024  BP: 117/87  Pulse: 73  SpO2: 100%   General: No acute distress, wearing sunglasses in the office due to bright light Head:  Normocephalic/atraumatic Skin/Extremities: No rash, no edema Neurological Exam: Mental status: alert and oriented to person, place, and time, no dysarthria or aphasia, Fund of knowledge is appropriate.  Recent and remote memory are intact, 3/3 delayed recall.  Attention and concentration are normal.     Cranial nerves: CN I: not tested CN II: pupils equal, round and reactive to light, visual fields intact CN III, IV, VI:  full range of motion, no nystagmus, no ptosis CN V: decreased cold on right V1 CN VII: upper and lower face symmetric CN VIII: hearing intact to conversation Bulk & Tone: normal, no fasciculations. Motor: 5/5 throughout with no pronator drift. Sensation: decreased cold on right arm, decreased cold on right leg, both LE L>R. Decreased vibration sense to knees bilaterally.  Deep Tendon Reflexes: +1 both UE, +2 both LE Plantar responses: downgoing  bilaterally Cerebellar: no incoordination on finger to nose testing Gait: narrow-based and steady, able to tandem walk adequately. Tremor: no restring tremor. There is a mild postural tremor bilaterally, no endpoint tremor   IMPRESSION: This is a 50 year old left-handed man with a history of alcohol use disorder (2 40-oz beer or 12-pack a day), PTSD, depression, admitted to [facility] on 02/12/21 for 3 syncopal episodes (with report  of shaking), found to have massive PE. Initial head CT normal, anticoagulation started 3/16, he was discharged home on 3/19 then back in the ER 3-4 hours later due to focal to bilateral tonic-clonic seizures and found to have a right lateral temporal and cerebellar hemorrhage. MRI no underlying lesion. No further convulsions since 01/2021, he had a focal seizure in 03/2021, but also reports episodes of staring/gaps in time. He has also been having daily headaches, sleep difficulties, dizziness, suggestive of postconcussion syndrome. We discussed starting Zonisamide for seizure and headache prophylaxis, start 100mg  qhs x 2 weeks, then increase to 200mg  qhs x 2 weeks, then 300mg  qhs. He will then wean off Levetiracetam which appears to also cause more mood changes (and possibly headaches). Repeat head CT reviewed, no further bleed seen. From a neurological standpoint, he may resume anticoagulation, discuss with PCP. [place] driving laws were discussed with the patient, and he knows to stop driving after a seizure, until 6 months seizure-free. Follow-up in 2-3 months, call for any changes.  Thank you for allowing me to participate in the care of this patient. Please do not hesitate to call for any questions or concerns.   Patrcia DollyKaren Omelia Marquart, M.D.  CC: Horton MarshallAnna Becker, PA, Dr. Pearlean BrownieSethi

## 2021-07-29 NOTE — Patient Instructions (Signed)
Start Zonisamide 100mg : Take 1 capsule every night for 2 weeks, then increase to 2 capsules every night for 2 weeks, then increase to 3 capsules every night  2. Continue Keppra 750mg  twice a day for another 6 weeks. Once you are on the 3 capsules of Zonisamide, reduce the Keppra to 1 tablet daily for 1 week, then stop. Hopefully the headaches get better as you get off the Keppra  3. From a neurological standpoint, ok to resume blood thinner if still needed. Follow-up with PCP for this and to discuss appetite  4. Follow-up in 2-3 months, call for any changes   Seizure Precautions: 1. If medication has been prescribed for you to prevent seizures, take it exactly as directed.  Do not stop taking the medicine without talking to your doctor first, even if you have not had a seizure in a long time.   2. Avoid activities in which a seizure would cause danger to yourself or to others.  Don't operate dangerous machinery, swim alone, or climb in high or dangerous places, such as on ladders, roofs, or girders.  Do not drive unless your doctor says you may.  3. If you have any warning that you may have a seizure, lay down in a safe place where you can't hurt yourself.    4.  No driving for 6 months from last seizure, as per [facility].   Please refer to the following link on the [employer]'s website for more information: http://www.epilepsyfoundation.org/answerplace/Social/driving/drivingu.cfm   5.  Maintain good sleep hygiene. Avoid alcohol.  6.  Contact your doctor if you have any problems that may be related to the medicine you are taking.  7.  Call 911 and bring the patient back to the ED if:        A.  The seizure lasts longer than 5 minutes.       B.  The patient doesn't awaken shortly after the seizure  C.  The patient has new problems such as difficulty seeing, speaking or moving  D.  The patient was injured during the seizure  E.  The patient has a  temperature over 102 F (39C)  F.  The patient vomited and now is having trouble breathing

## 2021-07-29 NOTE — Progress Notes (Signed)
Needs refill on Keppra, stated that keppra causes rash. Also stated that in march was to start on elliqus but the ball was dropped and he was never started on It. Pt also stated that the office lights are killing his head.

## 2021-08-14 DIAGNOSIS — Z0279 Encounter for issue of other medical certificate: Secondary | ICD-10-CM

## 2021-09-15 ENCOUNTER — Telehealth: Payer: Self-pay | Admitting: Neurology

## 2021-09-15 NOTE — Telephone Encounter (Signed)
Patient called and said Dr. Karel Jarvis completed form forms for him recently and the State of [place].  He said they are also needing in addition: current exam or office visit notes, written radiology report and any labs.  Fax: [phone]  Please include: State Member ID on a cover Sheet fax ID: [phone]

## 2021-09-17 NOTE — Telephone Encounter (Signed)
Last office notes faxed to number provided

## 2021-09-25 ENCOUNTER — Encounter: Payer: Self-pay | Admitting: Neurology

## 2021-09-25 ENCOUNTER — Other Ambulatory Visit (INDEPENDENT_AMBULATORY_CARE_PROVIDER_SITE_OTHER): Payer: 59

## 2021-09-25 ENCOUNTER — Ambulatory Visit (INDEPENDENT_AMBULATORY_CARE_PROVIDER_SITE_OTHER): Payer: 59 | Admitting: Neurology

## 2021-09-25 ENCOUNTER — Other Ambulatory Visit: Payer: Self-pay

## 2021-09-25 VITALS — BP 121/79 | HR 77 | Ht 69.0 in | Wt 211.2 lb

## 2021-09-25 DIAGNOSIS — F0781 Postconcussional syndrome: Secondary | ICD-10-CM

## 2021-09-25 DIAGNOSIS — G40009 Localization-related (focal) (partial) idiopathic epilepsy and epileptic syndromes with seizures of localized onset, not intractable, without status epilepticus: Secondary | ICD-10-CM

## 2021-09-25 DIAGNOSIS — I611 Nontraumatic intracerebral hemorrhage in hemisphere, cortical: Secondary | ICD-10-CM | POA: Diagnosis not present

## 2021-09-25 MED ORDER — ZONISAMIDE 100 MG PO CAPS
ORAL_CAPSULE | ORAL | 6 refills | Status: DC
Start: 2021-09-25 — End: 2022-02-13

## 2021-09-25 NOTE — Progress Notes (Signed)
NEUROLOGY FOLLOW UP OFFICE NOTE  Danny Lowe [id number] 04-22-1971  HISTORY OF PRESENT ILLNESS: I had the pleasure of seeing Danny Lowe in follow-up in the neurology clinic on 09/25/2021.  The patient was last seen 2 months ago for new onset seizure and postconcussive headaches. On his last visit, he was switched from Levetiracetam to Zonisamide for both seizure and headache prophylaxis, as well as possible mood changes with Levetiracetam. He feels better off Levetiracetam. He is on Zonisamide 300mg  qhs with no side effects. He still has the headaches but they are not as frequent. The intensity is still there when they occur once a week, usually on a Sunday evening, taking 3 days to subside. He does not take any prn headache medication. His son lives with him, he denies any staring episodes, loss of time or loss of consciousness. He had a brief involuntary jerk of the left arm and leg 2 weeks ago while lying in bed. Sleep is not good, about the same with interrupted sleep (8pm to 1am, then 3am to 6am). He takes Trazodone only prn when it gets "really,really bad." Since taking Eliquis, leg swelling on the left leg is better but still present. He notes his mood is better as well.   History on Initial Assessment 07/29/2021: This is a 50 year old left-handed man presenting for transfer of care. Records from hospital admissions and neurologist Dr. 54 were reviewed. He has a history of alcohol use disorder (2 40-oz beer or 12-pack a day), PTSD, depression, admitted to [facility] on 02/12/21 to 02/15/21 after 3 syncopal episodes earlier in the day, at least one of which he hit his face. He recalls feeling strange, he got up and "something took over me and threw me to the floor." His son found him unresponsive with shaking, eyes open, gurgling and called EMS because he had 2 episodes. He felt like he was separated from himself and declined ambulance initially but then he after he had another one, he was  brought to the ER. He was found hypotensive with SBP in the 80s, SpO2 in 80s. Head CT on admission did not show any acute changes, there was a small right parieto-occipital scalp contusion. He was found to have extensive acute pulmonary emboli involving the mainstem and proximal lobar pulmonary arteries with right heart strain, probably filling defect within the visualized IVC concerning for thrombus, extensive left lower extremity DVT. He was treated with heparin drip and given IV-tPA (reduced dose in setting of mild thrombocytopenia and head trauma with scalp contusion), then transitioned Eliquis that "likely will need lifelong." He was discharged ~ 1200 on 3/19 and went to [facility] for his Eliquis prescription when he felt strange. He recalls walking out of [facility] then waking up in the hospital 3 days later. ER notes indicate he was found down in the parking lot at 1500 hours with left-sided weakness. Pharmacist reported he had a syncopal episode in the store, awoke and exited, attempted to go to the incorrect car then had a convulsion. EMS reported seizure-like activity en route with worsening mental status, he had another seizure in the CT scanner, given 4mg  IV Ativan. Head CT showed right temporal and right cerebellar ICH. He was unresponsive with rhythmic shaking of the left arm and leg, intubated for airway protection and Eliquis was reversed. Initial EEG on sedation showed diffuse slowing with focal slowing and LPDs in the right temporal region. Repeat EEG on extubation showed slowing in the right temporal>central region and right temporal  sharp waves. Brain MRI without contrast on 02/16/21 showed 3 hematomas (lateral right cerebellum and 2 in the right lateral temporal lobe with surrounding edema), negative MRA for underlying vascular malformation. He underwent IVC filter placement on 02/16/21 with recommendation to resume anticoagulation in 4 weeks after ICH resolves. He was also found to have a right  UE DVT on 02/18/21. He was seen as an outpatient by Dr. Pearlean Brownie on 03/04/21, hypercoagulable panel was normal. Beta-2 glycoprotein showed isolated elevation of IgG which is of unclear significance. He was on Levetiracetam 1500mg  BID making him tired and sleepy. He also reported headache, feeling off balance, brain fog, memory not as sharp. Follow-up head CT and EEG were ordered. I personally reviewed head CT without contrast done 03/19/21 which did not show any acute changes, there was no further hemorrhage seen. There were hypodensities/encephalomalacia in the right cerebellum and right temporal lobe. EEG on 04/01/21 was normal.   He reports that since then, he had a small seizure last 04/07/21 while doing vestibular therapy. He had been reporting problems with equilibrium when turning his head, and as therapy was done, he felt it coming on with coldness, his left hand started trembling, right leg started trembling uncontrollably and he began to weep but did not know why he was weeping. It lasted 2 minutes. None since then. His 56 year old son lives with him, he has mentioned staring episodes, last one reported was last month. He has also noticed he loses track of time, going blank. No olfactory/gustatory hallucinations, myoclonic jerks. He has had daily headaches since the seizure, lasting all day, "I have headaches all the time." Since he has not been able to take Eliquis, he states he was instructed to take a daily aspirin. He does not take any other rescue medications. He is wearing sunglasses in the exam room, lights make his head hurt. He had head trauma in the [facility] in March 18, 1997 and had headaches but "not daily like this." He has dizziness with headaches and motion. Sometimes he has pain in his stomach like he was "kicked in the gonads." No diplopia, dysarthria/dysphagia. He does not sleep well, usually getting 3 hours of sleep, which is new. He has a history of insomnia, but in the past would sleep up to 5 hours with  Trazodone (not helping any longer). He has daytime drowsiness. He has been having visual hallucinations, he saw his deceased grandmother in 03-19-2023. Last May, he saw his son coming our of the room and walk to the kitchen, then minutes later he came through the side door. He feels like something is crawling on him, like a spider. He has noticed his reaction time is slower, evaluating it it is real or not. Around three times a week, he sees things from the corner of his eye, like a bug. The bigger hallucinations have occurred 4-5 times. He has not been driving. He states he is taking Levetiracetam 750mg  BID, that this is the dose prescribed. He is worried it is causing intermittent rash in his chest, arms, legs. He has a history of mild hand tremors since he got out of the [place] in 03-19-03, affecting eating, writing, making him feels embarrassed. He had a fall 3 weeks ago, no loss of consciousness. He has lost weight and lost his appetite. He is signed up for retirement on 09/30/21, he had been working as an 2005 for 13/1/22.    PAST MEDICAL HISTORY: Past Medical History:  Diagnosis Date   Depression  Stroke [facility]     MEDICATIONS: Current Outpatient Medications on File Prior to Visit  Medication Sig Dispense Refill   busPIRone (BUSPAR) 10 MG tablet Take 10 mg by mouth daily. OK to use lower dose (1/2 tablet by mouth 3 times daily) if anxiety is not so bad.     docusate sodium (COLACE) 100 MG capsule Take 1 capsule (100 mg total) by mouth 2 (two) times daily. 10 capsule 0   folic acid (FOLVITE) 1 MG tablet TAKE 1 TABLET (1 MG TOTAL) BY MOUTH DAILY. 30 tablet 0   hydrOXYzine (VISTARIL) 25 MG capsule Take 25-50 mg by mouth See admin instructions. Take 1 capsule (25mg ) by mouth twice daily as needed for anxiety and take 1-2 capsules (25-50mg ) by mouth at bedtime as needed for sleep.     sertraline (ZOLOFT) 25 MG tablet Take 25 mg by mouth daily.     traZODone (DESYREL) 100 MG tablet Take 100  mg by mouth at bedtime as needed for sleep.     zonisamide (ZONEGRAN) 100 MG capsule Take 1 capsule every night for 2 weeks, then increase to 2 capsules every night for 2 weeks, then increase to 3 capsules every night 90 capsule 6   No current facility-administered medications on file prior to visit.    ALLERGIES: Allergies  Allergen Reactions   Keppra [Levetiracetam] Rash    FAMILY HISTORY: Family History  Problem Relation Age of Onset   Diabetes Father     SOCIAL HISTORY: Social History   Socioeconomic History   Marital status: Single    Spouse name: Not on file   Number of children: Not on file   Years of education: Not on file   Highest education level: Not on file  Occupational History   Occupation: full time  Tobacco Use   Smoking status: Never   Smokeless tobacco: Former    Types: Use: Never used  Substance and Sexual Activity   Alcohol use: Not Currently    Comment: 12 beers daily   Drug use: Never   Sexual activity: Not on file  Other Topics Concern   Not on file  Social History Narrative   Lives with son   Left Handed   Drinks 6-8 cups caffeine daily   Social Determinants of Health   Financial Resource Strain: Not on file  Food Insecurity: Not on file  Transportation Needs: Not on file  Physical Activity: Not on file  Stress: Not on file  Social Connections: Not on file  Intimate Partner Violence: Not on file     PHYSICAL EXAM: Vitals:   09/25/21 1032  BP: 121/79  Pulse: 77  SpO2: 100%   General: No acute distress, wearing sunglasses in the office Head:  Normocephalic/atraumatic Skin/Extremities: No rash, no edema Neurological Exam: alert and awake. No aphasia or dysarthria. Fund of knowledge is appropriate.  Attention and concentration are normal.   Cranial nerves: Pupils equal, round. Extraocular movements intact with no nystagmus. Visual fields full.  No facial asymmetry.  Motor: Bulk and tone normal, muscle  strength 5/5 throughout with no pronator drift.   Finger to nose testing intact.  Gait narrow-based and steady, able to tandem walk adequately.  Romberg negative.   IMPRESSION: This is a 50 yo LH man with a history of alcohol use disorder (2 40-oz beer or 12-pack a day), PTSD, depression, admitted to [facility] on 02/12/21 for 3 syncopal episodes (with report of shaking), found to have a  massive PE. Initial head CT normal, anticoagulation started 3/16, he was discharged home on 3/19 then back in the ER 3-4 hours later due to focal to bilateral tonic-clonic seizures and found to have a right lateral temporal and cerebellar hemorrhage. MRI no underlying lesion. Follow-up head CT showed resolution of bleed. No further convulsions since 01/2021, he denies any focal seizures since 03/2021, no further loss of time since starting Zonisamide 2 months ago. There has been an improvement in headaches and mood as well off Levetiracetam. Continue Zonisamide 300mg  qhs. Note of elevated beta-glycoprotein on hospital admission, Dr. recommended repeating testing which will be ordered today. He was advised to see an eye doctor for continued photosensitivity despite improvement in headaches, as well as Dr. Roda Shutters to follow-up for PE as instructed in the hospital. He is aware of [place] driving laws to stop driving after a seizure until 6 months seizure-free. Follow-up in 6 months, call for any changes.     Thank you for allowing me to participate in his care.  Please do not hesitate to call for any questions or concerns.   Judeth Horn, M.D.   CC: Patrcia Dolly, PA

## 2021-09-25 NOTE — Patient Instructions (Addendum)
Good to see you!  Bloodwork for beta-2-glycoprotein  2. Please call Dr. Laurena Spies office  [phone] and let them know you had a pulmonary embolism in March 2022 and was instructed to follow-up with him  3. Schedule appointment with eye doctor  4. Continue Zonisamide 300mg  every night  5. Follow-up in 6 months, call for any changes   Seizure Precautions: 1. If medication has been prescribed for you to prevent seizures, take it exactly as directed.  Do not stop taking the medicine without talking to your doctor first, even if you have not had a seizure in a long time.   2. Avoid activities in which a seizure would cause danger to yourself or to others.  Don't operate dangerous machinery, swim alone, or climb in high or dangerous places, such as on ladders, roofs, or girders.  Do not drive unless your doctor says you may.  3. If you have any warning that you may have a seizure, lay down in a safe place where you can't hurt yourself.    4.  No driving for 6 months from last seizure, as per [facility].   Please refer to the following link on the [employer]ile[employer] of [employer]'s website for more information: http://www.epilepsyfoundation.org/answerplace/Social/driving/drivingu.cfm   5.  Maintain good sleep hygiene. Avoid alcohol.  6.  Contact your doctor if you have any problems that may be related to the medicine you are taking.  7.  Call 911 and bring the patient back to the ED if:        A.  The seizure lasts longer than 5 minutes.       B.  The patient doesn't awaken shortly after the seizure  C.  The patient has new problems such as difficulty seeing, speaking or moving  D.  The patient was injured during the seizure  E.  The patient has a temperature over 102 F (39C)  F.  The patient vomited and now is having trouble breathing

## 2021-09-26 LAB — BETA-2-GLYCOPROTEIN I ABS, IGG/M/A
Beta-2 Glyco 1 IgA: 13 GPI IgA units (ref 0–25)
Beta-2 Glyco 1 IgM: <9 GPI IgM units (ref 0–32)
Beta-2 Glyco I IgG: 63 GPI IgG units — ABNORMAL HIGH (ref 0–20)

## 2021-10-02 ENCOUNTER — Telehealth: Payer: Self-pay | Admitting: Neurology

## 2021-10-02 DIAGNOSIS — Z0279 Encounter for issue of other medical certificate: Secondary | ICD-10-CM

## 2021-10-02 NOTE — Telephone Encounter (Signed)
Paperwork is up front

## 2021-10-02 NOTE — Telephone Encounter (Signed)
Patient called to check on the status of some "retirement forms" he dropped off last week.

## 2021-11-01 ENCOUNTER — Other Ambulatory Visit: Payer: Self-pay | Admitting: Neurology

## 2021-12-02 ENCOUNTER — Other Ambulatory Visit: Payer: Self-pay | Admitting: Interventional Radiology

## 2021-12-02 DIAGNOSIS — Z95828 Presence of other vascular implants and grafts: Secondary | ICD-10-CM

## 2021-12-11 ENCOUNTER — Other Ambulatory Visit: Payer: 59

## 2021-12-23 ENCOUNTER — Ambulatory Visit
Admission: RE | Admit: 2021-12-23 | Discharge: 2021-12-23 | Disposition: A | Discharge status: Home / Self Care | Payer: Self-pay | Source: Ambulatory Visit | Attending: Interventional Radiology | Admitting: Interventional Radiology

## 2021-12-23 ENCOUNTER — Other Ambulatory Visit (HOSPITAL_COMMUNITY): Payer: Self-pay | Admitting: Interventional Radiology

## 2021-12-23 DIAGNOSIS — Z95828 Presence of other vascular implants and grafts: Secondary | ICD-10-CM

## 2021-12-23 NOTE — Progress Notes (Signed)
Patient ID: Danny Lowe, male   DOB: 06/21/1971, 51 y.o.   MRN: [id number]       Chief Complaint: Patient was seen in consultation today for presence of IVC filter at the request of Tom Ragsdale  Referring Physician(s): Jennelle Human, NP  History of Present Illness: Danny Lowe is a 52 y.o. male  known to our service from recent hospitalization for  acute intracranial hemorrhage post fall.   02/12/21 he  presented with shortness of breath and  PE was diagnosed on CTA chest.  He was diagnosed with concurrent extensive left lower extremity DVT 02/15/21 CT head showed acute bleed, and because of contraindications to anticoagulation, caval filtration was requested.   02/16/21 Retrievable IVC filter was placed  .  He was discharged and has undergone outpatient  neuro rehabilitation.  He is currently completely ambulatory.  No chest pain or shortness of breath.  Occasional medial left thigh pain.  No lower extremity swelling.  06/19/21 in preparation for possible filter removal, lower extremity ultrasound was performed demonstrating residual post thrombotic changes in the proximal left femoral vein.  Filter retrieval was postponed.  He returns today for reevaluation.  Ultrasound shows stable post thrombotic change in the proximal left femoral vein.  No acute DVT.  He is very active, remains on anticoagulation.  Past Medical History:  Diagnosis Date   Depression    Stroke [facility])     Past Surgical History:  Procedure Laterality Date   IR IVC FILTER PLMT / S&I /IMG GUID/MOD SED  02/16/2021   IR RADIOLOGIST EVAL & MGMT  06/19/2021    Allergies: Keppra [levetiracetam]  Medications: Prior to Admission medications   Medication Sig Start Date End Date Taking? Authorizing Provider  busPIRone (BUSPAR) 10 MG tablet Take 10 mg by mouth daily. OK to use lower dose (1/2 tablet by mouth 3 times daily) if anxiety is not so bad.    [provider]  docusate sodium (COLACE) 100 MG  capsule Take 1 capsule (100 mg total) by mouth 2 (two) times daily. 02/20/21   Antonieta Pert, MD  ELIQUIS 5 MG TABS tablet Take 5 mg by mouth daily. 09/23/21   [provider]  folic acid (FOLVITE) 1 MG tablet TAKE 1 TABLET (1 MG TOTAL) BY MOUTH DAILY. 02/20/21 02/20/22  Antonieta Pert, MD  hydrOXYzine (VISTARIL) 25 MG capsule Take 25-50 mg by mouth See admin instructions. Take 1 capsule (25mg ) by mouth twice daily as needed for anxiety and take 1-2 capsules (25-50mg ) by mouth at bedtime as needed for sleep.    [provider]  sertraline (ZOLOFT) 25 MG tablet Take 25 mg by mouth daily.    [provider]  traZODone (DESYREL) 100 MG tablet Take 100 mg by mouth at bedtime as needed for sleep.    [provider]  zonisamide (ZONEGRAN) 100 MG capsule Take 3 capsules every night 09/25/21   Cameron Sprang, MD     Family History  Problem Relation Age of Onset   Diabetes Father     Social History   Socioeconomic History   Marital status: Single    Spouse name: Not on file   Number of children: Not on file   Years of education: Not on file   Highest education level: Not on file  Occupational History   Occupation: full time  Tobacco Use   Smoking status: Never   Smokeless tobacco: Former    Types: Nurse, children's Use: Never used  Substance  and Sexual Activity   Alcohol use: Not Currently    Comment: 12 beers daily   Drug use: Never   Sexual activity: Not on file  Other Topics Concern   Not on file  Social History Narrative   Lives with son   Left Handed   Drinks 6-8 cups caffeine daily   Social Determinants of Health   Financial Resource Strain: Not on file  Food Insecurity: Not on file  Transportation Needs: Not on file  Physical Activity: Not on file  Stress: Not on file  Social Connections: Not on file    ECOG Status: 0 - Asymptomatic  Review of Systems: A 12 point ROS discussed and pertinent positives are indicated in the HPI  above.  All other systems are negative.  Review of Systems  Vital Signs: There were no vitals taken for this visit.  Physical Exam Constitutional: Oriented to person, place, and time. Well-developed and well-nourished. No distress.   HENT:  Head: Normocephalic and atraumatic.  Eyes: Conjunctivae and EOM are normal. Right eye exhibits no discharge. Left eye exhibits no discharge. No scleral icterus.  Neck: No JVD present.  Pulmonary/Chest: Effort normal. No stridor. No respiratory distress.  Abdomen: soft, non distended Neurological:  alert and oriented to person, place, and time.  Skin: Skin is warm and dry.  not diaphoretic.  Psychiatric:   normal mood and affect.   behavior is normal. Judgment and thought content normal.        Imaging: EXAM: BILATERAL LOWER EXTREMITY VENOUS DOPPLER ULTRASOUND  TECHNIQUE: Gray-scale sonography with compression, as well as color and duplex ultrasound, were performed to evaluate the deep venous system(s) from the level of the common femoral vein through the popliteal and proximal calf veins.  COMPARISON: None.  FINDINGS: VENOUS  On the left, paired femoral veins are again identified. There is stable short-segment eccentric hypoechoic mural thickening in the proximal aspect of 1 moiety of the paired femoral veins, resulting in mild narrowing of the lumen. Continued good flow signal through this region on color Doppler. Elsewhere normal compressibility of the common femoral, paired femoral, and popliteal veins, as well as the visualized calf veins. Visualized portions of profunda femoral vein and great saphenous vein unremarkable. No filling defects to suggest DVT on grayscale or color Doppler imaging. Doppler waveforms show normal direction of venous flow, normal respiratory phasicity and response to augmentation.  On the right, normal compressibility of the common femoral, superficial femoral, and popliteal veins, as well as the  visualized calf veins. Visualized portions of profunda femoral vein and great saphenous vein unremarkable. No filling defects to suggest DVT on grayscale or color Doppler imaging. Doppler waveforms show normal direction of venous flow, normal respiratory phasicity and response to augmentation.  OTHER  None.  Limitations: none  IMPRESSION: 1. No acute or recurrent DVT. 2. 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   Electronically Signed By: Lucrezia Europe M.D. On: 12/23/2021 14:27   Labs:  CBC: Recent Labs    02/17/21 0903 02/18/21 0105 02/19/21 0910 02/20/21 0351  WBC 9.1 6.1 6.6 7.6  HGB 15.2 14.0 14.8 14.7  HCT 45.3 42.0 43.2 44.3  PLT 150 164 208 232    COAGS: Recent Labs    02/12/21 2358 02/13/21 0624 02/15/21 1510 02/16/21 0138  INR 1.2 1.8* 1.3* 1.1  APTT 21* 48* 24  --     BMP: Recent Labs    02/17/21  [id number] 02/18/21 0105 02/19/21 0910 02/20/21 0351  NA 134* 136 136 137  K 3.9 3.4* 3.6 3.5  CL 102 105 104 103  CO2 25 24 27 28   GLUCOSE 90 87 131* 99  BUN 8 9 5* 5*  CALCIUM 8.1* 8.1* 8.6* 8.5*  CREATININE 1.19 1.15 1.25* 1.15  GFRNONAA >60 >60 >60 >60    LIVER FUNCTION TESTS: Recent Labs    02/15/21 0143 02/15/21 1510 02/19/21 0910 02/20/21 0351  BILITOT 0.8 0.6 1.0 0.7  AST 37 40 27 25  ALT 32 34 23 21  ALKPHOS 39 40 32* 36*  PROT 5.6* 7.2 6.4* 6.5  ALBUMIN 2.3* 3.0* 2.6* 2.5*    TUMOR MARKERS: No results for input(s): AFPTM, CEA, CA199, CHROMGRNA in the last 8760 hours.  Assessment and Plan:  My impression is that this patient is doing well post IVC filter placement, now fully active and fully anticoagulated without complication.  Lower extremity   ultrasound is stable.  We reviewed briefly the pathophysiology of pulmonary thromboembolic disease.  We reviewed the structure and function of modern IVC filters and the role in  the setting of contraindications to or failure of anticoagulation.  We reviewed possible risks and complications of long-term caval filtration including filter fracture, caval wall and adjacent organ perforation, and fragment migration as well as caval thrombosis.  The patient seemed to understand and had her questions answered. My recommendation would be caval retrieval at this point as he is tolerating anticoagulation without complication. We discussed technique of transjugular inferior vena cavography and filter retrieval under moderate sedation as an outpatient.  he seemed to understand and had   questions answered.  he is motivated to proceed.  Accordingly, we can set   up for IVC filter retrieval as an outpatient at his  convenience.   Thank you for this interesting consult.  I greatly enjoyed meeting Danny Lowe and look forward to participating in their care.  A copy of this report was sent to the requesting provider on this date.  Electronically Signed: Rickard Rhymes 12/23/2021, 2:28 PM   I spent a total of    25 Minutes in face to face in clinical consultation, greater than 50% of which was counseling/coordinating care for presence of IVC filter.

## 2022-01-07 NOTE — Therapy (Signed)
[facility] [address] [place], [place], [address] Phone: [phone]   Fax:  [phone]  Patient Details  Name: Danny Lowe MRN: [id number] Date of Birth: 22-Jul-1971 Referring Provider:  No ref. provider found  Encounter Date: 01/07/2022  PHYSICAL THERAPY NON VISIT DISCHARGE SUMMARY  Visits from Start of Care: 4  Current functional level related to goals / functional outcomes: Patient is being discharged from PT services due to not returning since last scheduled visit.    Remaining deficits: Dizziness   Patient agrees to discharge. Patient goals were not met. Patient is being discharged due to not returning since the last visit.   Jones Bales, PT, DPT 01/07/2022, 1:44 PM  [facility] [address] [place], [place], [address] Phone: [phone]   Fax:  [phone]

## 2022-01-22 ENCOUNTER — Other Ambulatory Visit: Payer: Self-pay | Admitting: Student

## 2022-01-23 ENCOUNTER — Ambulatory Visit (HOSPITAL_COMMUNITY)
Admission: RE | Admit: 2022-01-23 | Discharge: 2022-01-23 | Disposition: A | Discharge status: Home / Self Care | Payer: Non-veteran care | Source: Ambulatory Visit | Attending: Interventional Radiology | Admitting: Interventional Radiology

## 2022-01-23 ENCOUNTER — Encounter (HOSPITAL_COMMUNITY): Payer: Self-pay

## 2022-01-23 ENCOUNTER — Other Ambulatory Visit: Payer: Self-pay

## 2022-01-23 DIAGNOSIS — Z7901 Long term (current) use of anticoagulants: Secondary | ICD-10-CM | POA: Insufficient documentation

## 2022-01-23 DIAGNOSIS — F32A Depression, unspecified: Secondary | ICD-10-CM | POA: Insufficient documentation

## 2022-01-23 DIAGNOSIS — Z86711 Personal history of pulmonary embolism: Secondary | ICD-10-CM | POA: Diagnosis not present

## 2022-01-23 DIAGNOSIS — Z86718 Personal history of other venous thrombosis and embolism: Secondary | ICD-10-CM | POA: Diagnosis not present

## 2022-01-23 DIAGNOSIS — Z87891 Personal history of nicotine dependence: Secondary | ICD-10-CM | POA: Insufficient documentation

## 2022-01-23 DIAGNOSIS — Z8673 Personal history of transient ischemic attack (TIA), and cerebral infarction without residual deficits: Secondary | ICD-10-CM | POA: Diagnosis not present

## 2022-01-23 DIAGNOSIS — Z95828 Presence of other vascular implants and grafts: Secondary | ICD-10-CM

## 2022-01-23 DIAGNOSIS — Z4589 Encounter for adjustment and management of other implanted devices: Secondary | ICD-10-CM | POA: Insufficient documentation

## 2022-01-23 HISTORY — PX: IR IVC FILTER RETRIEVAL / S&I /IMG GUID/MOD SED: IMG5308

## 2022-01-23 LAB — CBC
HCT: 49 % (ref 39.0–52.0)
Hemoglobin: 16.6 g/dL (ref 13.0–17.0)
MCH: 31.5 pg (ref 26.0–34.0)
MCHC: 33.9 g/dL (ref 30.0–36.0)
MCV: 93 fL (ref 80.0–100.0)
Platelets: 157 10*3/uL (ref 150–400)
RBC: 5.27 MIL/uL (ref 4.22–5.81)
RDW: 14.1 % (ref 11.5–15.5)
WBC: 4.7 10*3/uL (ref 4.0–10.5)
nRBC: 0 % (ref 0.0–0.2)

## 2022-01-23 LAB — BASIC METABOLIC PANEL
Anion gap: 9 (ref 5–15)
BUN: 14 mg/dL (ref 6–20)
CO2: 27 mmol/L (ref 22–32)
Calcium: 9 mg/dL (ref 8.9–10.3)
Chloride: 103 mmol/L (ref 98–111)
Creatinine, Ser: 1.27 mg/dL — ABNORMAL HIGH (ref 0.61–1.24)
GFR, Estimated: >60 mL/min (ref >60)
Glucose, Bld: 75 mg/dL (ref 70–99)
Potassium: 4.1 mmol/L (ref 3.5–5.1)
Sodium: 139 mmol/L (ref 135–145)

## 2022-01-23 LAB — PROTIME-INR
INR: 1.1 (ref 0.8–1.2)
Prothrombin Time: 13.8 seconds (ref 11.4–15.2)

## 2022-01-23 MED ORDER — MIDAZOLAM HCL 2 MG/2ML IJ SOLN
INTRAMUSCULAR | Status: AC | PRN
  Administered 2022-01-23: 1 mg via INTRAVENOUS

## 2022-01-23 MED ORDER — FENTANYL CITRATE (PF) 100 MCG/2ML IJ SOLN
INTRAMUSCULAR | Status: AC | PRN
  Administered 2022-01-23: 25 ug via INTRAVENOUS

## 2022-01-23 MED ORDER — MIDAZOLAM HCL 2 MG/2ML IJ SOLN
INTRAMUSCULAR | Status: AC
  Filled 2022-01-23: qty 2

## 2022-01-23 MED ORDER — LIDOCAINE HCL (PF) 1 % IJ SOLN
INTRAMUSCULAR | Status: AC | PRN
  Administered 2022-01-23: 5 mL via INTRADERMAL

## 2022-01-23 MED ORDER — SODIUM CHLORIDE 0.9 % IV SOLN: INTRAVENOUS | Status: DC

## 2022-01-23 MED ORDER — FENTANYL CITRATE (PF) 100 MCG/2ML IJ SOLN
INTRAMUSCULAR | Status: AC
  Filled 2022-01-23: qty 2

## 2022-01-23 MED ORDER — FENTANYL CITRATE (PF) 100 MCG/2ML IJ SOLN
INTRAMUSCULAR | Status: AC | PRN
  Administered 2022-01-23: 50 ug via INTRAVENOUS

## 2022-01-23 MED ORDER — MIDAZOLAM HCL 2 MG/2ML IJ SOLN
INTRAMUSCULAR | Status: AC | PRN
  Administered 2022-01-23: .5 mg via INTRAVENOUS

## 2022-01-23 MED ORDER — HYDROCODONE-ACETAMINOPHEN 5-325 MG PO TABS: 1.0000 | ORAL_TABLET | ORAL | Status: DC | PRN

## 2022-01-23 MED ORDER — LIDOCAINE HCL 1 % IJ SOLN
INTRAMUSCULAR | Status: AC
  Filled 2022-01-23: qty 20

## 2022-01-23 MED ORDER — IOHEXOL 300 MG/ML  SOLN
100.0000 mL | Freq: Once | INTRAMUSCULAR | Status: AC | PRN
  Administered 2022-01-23: 75 mL via INTRAVENOUS

## 2022-01-23 NOTE — Discharge Instructions (Addendum)
The removal procedure today was unsuccessful. You will be contacted by Interventional Radiology next week for another appointment to remove this filter. They will use general anesthesia when they remove the filter. It will be an out patient procedure with a 3-4 hour recovery. You will need for someone to spend the night with you after this procedure.  There are no changes to your home medications.    Interventional radiology phone numbers [phone] After hours [phone] Attempted IVC filter removal, Care After These instructions tell you how to care for yourself after your procedure. Your doctor may also give you more specific instructions. Call your doctor if you have any problems or questions. What can I expect after the procedure? After the procedure, it is common to have: Soreness. Bruising. Mild pain. Follow these instructions at home: Return to your normal activities as told by your doctor. Ask your doctor what activities are safe for you. Take over-the-counter and prescription medicines only as told by your doctor. Wash your hands with soap and water before you change your bandage (dressing). If you cannot use soap and water, use hand sanitizer. Follow instructions from your doctor about: How to take care of your puncture site. When and how to change your bandage. When to remove your bandage. Check your puncture site every day for signs of infection. Watch for: Redness, swelling, or pain. Fluid or blood. Pus or a bad smell. Warmth. Do not take baths, swim, or use a hot tub until your doctor approves. You may remove your dressing tomorrow and shower. Keep all follow-up visits as told by your doctor. This is important.   Contact a doctor if you have: A fever. Redness, swelling, or pain at the puncture site, and it lasts longer than a few days. Fluid, blood, or pus coming from the puncture site. Warmth coming from the puncture site. Get help right away if: You have a  lot of bleeding from the puncture site. Summary After the procedure, it is common to have soreness, bruising, or mild pain at the puncture site. Check your puncture site every day for signs of infection, such as redness, swelling, or pain. Get help right away if you have severe bleeding from your puncture site. This information is not intended to replace advice given to you by your health care provider. Make sure you discuss any questions you have with your health care provider. Document Revised: 05/16/2020 Document Reviewed: 05/16/2020 Elsevier Patient Education  2021 Elsevier Inc.     Moderate Conscious Sedation, Adult, Care After This sheet gives you information about how to care for yourself after your procedure. Your health care provider may also give you more specific instructions. If you have problems or questions, contact your health care provider. What can I expect after the procedure? After the procedure, it is common to have: Sleepiness for several hours. Impaired judgment for several hours. Difficulty with balance. Vomiting if you eat too soon. Follow these instructions at home: For the time period you were told by your health care provider: Rest. Do not participate in activities where you could fall or become injured. Do not drive or use machinery. Do not drink alcohol. Do not take sleeping pills or medicines that cause drowsiness. Do not make important decisions or sign legal documents. Do not take care of children on your own.      Eating and drinking Follow the diet recommended by your health care provider. Drink enough fluid to keep your urine pale yellow. If you vomit: Drink  water, juice, or soup when you can drink without vomiting. Make sure you have little or no nausea before eating solid foods.   General instructions Take over-the-counter and prescription medicines only as told by your health care provider. Have a responsible adult stay with you for the  time you are told. It is important to have someone help care for you until you are awake and alert. Do not smoke. Keep all follow-up visits as told by your health care provider. This is important. Contact a health care provider if: You are still sleepy or having trouble with balance after 24 hours. You feel light-headed. You keep feeling nauseous or you keep vomiting. You develop a rash. You have a fever. You have redness or swelling around the IV site. Get help right away if: You have trouble breathing. You have new-onset confusion at home. Summary After the procedure, it is common to feel sleepy, have impaired judgment, or feel nauseous if you eat too soon. Rest after you get home. Know the things you should not do after the procedure. Follow the diet recommended by your health care provider and drink enough fluid to keep your urine pale yellow. Get help right away if you have trouble breathing or new-onset confusion at home. This information is not intended to replace advice given to you by your health care provider. Make sure you discuss any questions you have with your health care provider. Document Revised: 03/15/2020 Document Reviewed: 10/12/2019 Elsevier Patient Education  2021 [employer].

## 2022-01-23 NOTE — H&P (Signed)
Chief Complaint: IVC filter no longer needed. Patient presents for IVC filter removal - Celect  Referring Physician(s): Hassell,Daniel  Supervising Physician: Oley Balm  Patient Status: [facility] - Out-pt  History of Present Illness: Danny Lowe is a 51 y.o. male  outpatient. Known to IR service.History of  depression, alcohol use. Admitted to [facility] on 3.19.22 for unprovoked PE and extensive left leg DVT.  Discharged  on eliquis and then found down 2 hours later. Imaging showed intracranial hemorrhage. Due to the patient;'s inability to tolerate anticoagulation IR placed a Celect 30 mm IVC filter on 3.20.22. Patient has since successfully been able to tolerate anticoagulation with no additional issues. Patient has been followed by Dr. Deanne Coffer in Interventional Radiology removal. Patient presents for IVC filter removal.   Currently without any significant complaints. Patient alert and laying in bed, calm and comfortable. Denies any fevers, headache, chest pain, SOB, cough, abdominal pain, nausea, vomiting or bleeding. Return precautions and treatment recommendations and follow-up discussed with the patient  who is agreeable with the plan.  Past Medical History:  Diagnosis Date   Depression    Stroke [facility])     Past Surgical History:  Procedure Laterality Date   IR IVC FILTER PLMT / S&I /IMG GUID/MOD SED  02/16/2021   IR RADIOLOGIST EVAL & MGMT  06/19/2021    Allergies: Keppra [levetiracetam]  Medications: Prior to Admission medications   Medication Sig Start Date End Date Taking? Authorizing Provider  busPIRone (BUSPAR) 10 MG tablet Take 10 mg by mouth daily. OK to use lower dose (1/2 tablet by mouth 3 times daily) if anxiety is not so bad.    [provider]  docusate sodium (COLACE) 100 MG capsule Take 1 capsule (100 mg total) by mouth 2 (two) times daily. 02/20/21   Lanae Boast, MD  ELIQUIS 5 MG TABS tablet Take 5 mg by mouth daily. 09/23/21   [provider]  folic acid (FOLVITE) 1 MG tablet TAKE 1 TABLET (1 MG TOTAL) BY MOUTH DAILY. 02/20/21 02/20/22  Lanae Boast, MD  hydrOXYzine (VISTARIL) 25 MG capsule Take 25-50 mg by mouth See admin instructions. Take 1 capsule (25mg ) by mouth twice daily as needed for anxiety and take 1-2 capsules (25-50mg ) by mouth at bedtime as needed for sleep.    [provider]  sertraline (ZOLOFT) 25 MG tablet Take 25 mg by mouth daily.    [provider]  traZODone (DESYREL) 100 MG tablet Take 100 mg by mouth at bedtime as needed for sleep.    [provider]  zonisamide (ZONEGRAN) 100 MG capsule Take 3 capsules every night 09/25/21   09/27/21, MD     Family History  Problem Relation Age of Onset   Diabetes Father     Social History   Socioeconomic History   Marital status: Single    Spouse name: Not on file   Number of children: Not on file   Years of education: Not on file   Highest education level: Not on file  Occupational History   Occupation: full time  Tobacco Use   Smoking status: Never   Smokeless tobacco: Former    Types: [place] Use: Never used  Substance and Sexual Activity   Alcohol use: Not Currently    Comment: 12 beers daily   Drug use: Never   Sexual activity: Not on file  Other Topics Concern   Not on file  Social History Narrative  Lives with son   Left Handed   Drinks 6-8 cups caffeine daily   Social Determinants of Health   Financial Resource Strain: Not on file  Food Insecurity: Not on file  Transportation Needs: Not on file  Physical Activity: Not on file  Stress: Not on file  Social Connections: Not on file    Review of Systems: A 12 point ROS discussed and pertinent positives are indicated in the HPI above.  All other systems are negative.  Review of Systems  Constitutional:  Negative for fever.  HENT:  Negative for congestion.   Respiratory:  Negative for cough and shortness of breath.    Cardiovascular:  Negative for chest pain.  Gastrointestinal:  Negative for abdominal pain.  Neurological:  Negative for headaches.  Psychiatric/Behavioral:  Negative for behavioral problems and confusion.    Vital Signs: BP 117/73    Pulse 88    Temp 98.6 F (37 C) (Oral)    Resp 18    SpO2 99%   Physical Exam Vitals and nursing note reviewed.  Constitutional:      Appearance: He is well-developed.  HENT:     Head: Normocephalic.  Cardiovascular:     Rate and Rhythm: Normal rate and regular rhythm.     Heart sounds: Normal heart sounds.  Pulmonary:     Effort: Pulmonary effort is normal.     Breath sounds: Normal breath sounds.  Musculoskeletal:        General: Normal range of motion.     Cervical back: Normal range of motion.  Skin:    General: Skin is dry.  Neurological:     Mental Status: He is alert and oriented to person, place, and time.    Imaging: No results found.  Labs:  CBC: Recent Labs    02/17/21 0903 02/18/21 0105 02/19/21 0910 02/20/21 0351  WBC 9.1 6.1 6.6 7.6  HGB 15.2 14.0 14.8 14.7  HCT 45.3 42.0 43.2 44.3  PLT 150 164 208 232    COAGS: Recent Labs    02/12/21 2358 02/13/21 0624 02/15/21 1510 02/16/21 0138  INR 1.2 1.8* 1.3* 1.1  APTT 21* 48* 24  --     BMP: Recent Labs    02/17/21 0903 02/18/21 0105 02/19/21 0910 02/20/21 0351  NA 134* 136 136 137  K 3.9 3.4* 3.6 3.5  CL 102 105 104 103  CO2 25 24 27 28   GLUCOSE 90 87 131* 99  BUN 8 9 5* 5*  CALCIUM 8.1* 8.1* 8.6* 8.5*  CREATININE 1.19 1.15 1.25* 1.15  GFRNONAA >60 >60 >60 >60    LIVER FUNCTION TESTS: Recent Labs    02/15/21 0143 02/15/21 1510 02/19/21 0910 02/20/21 0351  BILITOT 0.8 0.6 1.0 0.7  AST 37 40 27 25  ALT 32 34 23 21  ALKPHOS 39 40 32* 36*  PROT 5.6* 7.2 6.4* 6.5  ALBUMIN 2.3* 3.0* 2.6* 2.5*     Assessment and Plan:  51 y.o. male outpatient. Known to IR service.History of  depression, alcohol use. Admitted to [facility] on 3.19.22 for  unprovoked PE and extensive left leg DVT.  Discharged  on eliquis and then found down 2 hours later. Imaging showed intracranial hemorrhage. Due to the patient's inability to tolerate anticoagulation IR placed a Celect 30 mm IVC filter on 3.20.22. Patient has been followed by Dr. Deanne Coffer in Interventional Radiology. Patient has since successfully been able to tolerate anticoagulation with no additional issues. Patient presents for IVC filter removal.  On eliquis. No recent labs. US venous bilateral from 1.24.23 reads No acute or recurrent DVT. 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 No pertinent allergies. Patient has been NPO since midnight.    Risks and benefits discussed with the patient including, but not limited to bleeding, infection, contrast induced renal failure, filter fracture or migration which can lead to emergency surgery or even death, strut penetration with damage or irritation to adjacent structures and caval thrombosis. All of the patient's questions were answered, patient is agreeable to proceed. Consent signed and in chart.   Thank you for this interesting consult.  I greatly enjoyed meeting Danny Lowe and look forward to participating in their care.  A copy of this report was sent to the requesting provider on this date.  Electronically Signed: Alene Mires, NP 01/23/2022, 12:41 PM   I spent a total of  30 Minutes   in face to face in clinical consultation, greater than 50% of which was counseling/coordinating care for IVC filter removal.

## 2022-01-23 NOTE — Sedation Documentation (Signed)
Patient is resting comfortably, snoring lightly, in NAD. 

## 2022-01-23 NOTE — Procedures (Signed)
°  Procedure: IVCgram, IVC filter retrieval (attempted) tip embedded in lateral wall of IVC, could not retrieve despite glidewire snare technique; will need resched for complex filter retrieval EBL:   minimal Complications:  none immediate  See full dictation in [employer].  Thora Lance MD Main # [phone] Pager  [phone] Mobile [phone]

## 2022-01-26 DIAGNOSIS — Z0271 Encounter for disability determination: Secondary | ICD-10-CM

## 2022-01-27 ENCOUNTER — Other Ambulatory Visit: Payer: Self-pay | Admitting: Interventional Radiology

## 2022-01-27 ENCOUNTER — Other Ambulatory Visit (HOSPITAL_COMMUNITY): Payer: Self-pay | Admitting: Interventional Radiology

## 2022-01-27 DIAGNOSIS — Z95828 Presence of other vascular implants and grafts: Secondary | ICD-10-CM

## 2022-01-28 ENCOUNTER — Encounter (HOSPITAL_COMMUNITY): Payer: Self-pay

## 2022-01-28 ENCOUNTER — Ambulatory Visit (HOSPITAL_COMMUNITY)
Admission: RE | Admit: 2022-01-28 | Discharge: 2022-01-28 | Disposition: A | Discharge status: Home / Self Care | Payer: Non-veteran care | Source: Ambulatory Visit | Attending: Interventional Radiology | Admitting: Interventional Radiology

## 2022-01-28 ENCOUNTER — Other Ambulatory Visit: Payer: Self-pay

## 2022-01-28 DIAGNOSIS — Z95828 Presence of other vascular implants and grafts: Secondary | ICD-10-CM | POA: Insufficient documentation

## 2022-01-28 MED ORDER — SODIUM CHLORIDE (PF) 0.9 % IJ SOLN
INTRAMUSCULAR | Status: AC
Start: 2022-01-28 — End: 2022-01-28
  Filled 2022-01-28: qty 50

## 2022-01-28 MED ORDER — IOHEXOL 350 MG/ML SOLN
100.0000 mL | Freq: Once | INTRAVENOUS | Status: AC | PRN
  Administered 2022-01-28: 100 mL via INTRAVENOUS

## 2022-01-28 MED ORDER — SODIUM CHLORIDE (PF) 0.9 % IJ SOLN
INTRAMUSCULAR | Status: AC
  Filled 2022-01-28: qty 50

## 2022-02-04 ENCOUNTER — Other Ambulatory Visit (HOSPITAL_COMMUNITY): Payer: Self-pay | Admitting: Physician Assistant

## 2022-02-05 ENCOUNTER — Inpatient Hospital Stay (HOSPITAL_COMMUNITY): Admission: RE | Admit: 2022-02-05 | Payer: Self-pay | Source: Ambulatory Visit

## 2022-02-05 ENCOUNTER — Ambulatory Visit (HOSPITAL_COMMUNITY): Payer: Self-pay

## 2022-02-10 ENCOUNTER — Encounter (HOSPITAL_COMMUNITY): Payer: Self-pay

## 2022-02-10 ENCOUNTER — Other Ambulatory Visit: Payer: Self-pay

## 2022-02-10 ENCOUNTER — Emergency Department (HOSPITAL_COMMUNITY)
Admission: EM | Admit: 2022-02-10 | Discharge: 2022-02-10 | Disposition: A | Discharge status: Home / Self Care | Payer: No Typology Code available for payment source | Source: Home / Self Care | Attending: Emergency Medicine | Admitting: Emergency Medicine

## 2022-02-10 ENCOUNTER — Emergency Department (HOSPITAL_COMMUNITY): Payer: No Typology Code available for payment source

## 2022-02-10 DIAGNOSIS — Z8673 Personal history of transient ischemic attack (TIA), and cerebral infarction without residual deficits: Secondary | ICD-10-CM | POA: Insufficient documentation

## 2022-02-10 DIAGNOSIS — R509 Fever, unspecified: Secondary | ICD-10-CM | POA: Insufficient documentation

## 2022-02-10 DIAGNOSIS — A419 Sepsis, unspecified organism: Secondary | ICD-10-CM | POA: Diagnosis not present

## 2022-02-10 DIAGNOSIS — Z86711 Personal history of pulmonary embolism: Secondary | ICD-10-CM | POA: Insufficient documentation

## 2022-02-10 DIAGNOSIS — R519 Headache, unspecified: Secondary | ICD-10-CM

## 2022-02-10 DIAGNOSIS — Z20822 Contact with and (suspected) exposure to covid-19: Secondary | ICD-10-CM | POA: Insufficient documentation

## 2022-02-10 DIAGNOSIS — Z7901 Long term (current) use of anticoagulants: Secondary | ICD-10-CM | POA: Insufficient documentation

## 2022-02-10 DIAGNOSIS — J129 Viral pneumonia, unspecified: Secondary | ICD-10-CM | POA: Diagnosis not present

## 2022-02-10 LAB — RESP PANEL BY RT-PCR (FLU A&B, COVID) ARPGX2
Influenza A by PCR: NEGATIVE
Influenza B by PCR: NEGATIVE
SARS Coronavirus 2 by RT PCR: NEGATIVE

## 2022-02-10 MED ORDER — ACETAMINOPHEN 325 MG PO TABS
325.0000 mg | ORAL_TABLET | Freq: Once | ORAL | Status: AC
  Administered 2022-02-10: 325 mg via ORAL
  Filled 2022-02-10: qty 1

## 2022-02-10 MED ORDER — ACETAMINOPHEN 325 MG PO TABS
650.0000 mg | ORAL_TABLET | Freq: Once | ORAL | Status: AC
  Administered 2022-02-10: 650 mg via ORAL
  Filled 2022-02-10: qty 2

## 2022-02-10 MED ORDER — IBUPROFEN 200 MG PO TABS
600.0000 mg | ORAL_TABLET | Freq: Once | ORAL | Status: AC
  Administered 2022-02-10: 600 mg via ORAL
  Filled 2022-02-10: qty 3

## 2022-02-10 MED ORDER — IBUPROFEN 200 MG PO TABS
200.0000 mg | ORAL_TABLET | Freq: Once | ORAL | Status: AC
Start: 2022-02-10 — End: 2022-02-10
  Administered 2022-02-10: 200 mg via ORAL
  Filled 2022-02-10: qty 1

## 2022-02-10 NOTE — ED Provider Notes (Signed)
?[facility]-EMERGENCY DEPT ?Provider Note ? ? ?CSN: [id number] ?Arrival date & time: 02/10/22  1438 ? ?  ? ?History ? ?Chief Complaint  ?Patient presents with  ? Fever  ? Generalized Body Aches  ? Headache  ? ? ?Danny Lowe is a 51 y.o. male. ? ?HPI ? ?Patient is a 51 year old male with a history of PE, depression, CVA, intracranial hemorrhage, presents the emergency department today for evaluation of fever and headache.  He states that for the last 24 hours he has had a fever.  He has some lymph node swelling on his neck that is painful.  His main complaint is having a headache.  He states it is very severe in nature.  He tried taking Tylenol but symptoms persist.  He does note that his child was sick earlier this week. ? ?Home Medications ?Prior to Admission medications   ?Medication Sig Start Date End Date Taking? Authorizing Provider  ?busPIRone (BUSPAR) 10 MG tablet Take 10 mg by mouth daily. OK to use lower dose (1/2 tablet by mouth 3 times daily) if anxiety is not so bad.    [provider]  ?docusate sodium (COLACE) 100 MG capsule Take 1 capsule (100 mg total) by mouth 2 (two) times daily. 02/20/21   Lanae Boast, MD  ?ELIQUIS 5 MG TABS tablet Take 5 mg by mouth daily. 09/23/21   [provider]  ?folic acid (FOLVITE) 1 MG tablet TAKE 1 TABLET (1 MG TOTAL) BY MOUTH DAILY. 02/20/21 02/20/22  Lanae Boast, MD  ?hydrOXYzine (VISTARIL) 25 MG capsule Take 25-50 mg by mouth See admin instructions. Take 1 capsule (25mg ) by mouth twice daily as needed for anxiety and take 1-2 capsules (25-50mg ) by mouth at bedtime as needed for sleep.    [provider]  ?sertraline (ZOLOFT) 25 MG tablet Take 25 mg by mouth daily.    [provider]  ?traZODone (DESYREL) 100 MG tablet Take 100 mg by mouth at bedtime as needed for sleep.    [provider]  ?zonisamide (ZONEGRAN) 100 MG capsule Take 3 capsules every night 09/25/21   09/27/21, MD  ?   ? ?Allergies     ?Keppra [levetiracetam]   ? ?Review of Systems   ?Review of Systems ?See HPI for pertinent positives or negatives. ? ? ?Physical Exam ?Updated Vital Signs ?BP 131/80 (BP Location: Right Arm)   Pulse 97   Temp (!) 102.8 ?F (39.3 ?C) (Oral)   Resp 18   Ht 5\' 9"  (1.753 m)   Wt 100.7 kg   SpO2 94%   BMI 32.78 kg/m?  ?Physical Exam ?Constitutional:   ?   General: He is not in acute distress. ?   Appearance: He is well-developed.  ?Eyes:  ?   Conjunctiva/sclera: Conjunctivae normal.  ?Cardiovascular:  ?   Rate and Rhythm: Normal rate and regular rhythm.  ?Pulmonary:  ?   Effort: Pulmonary effort is normal.  ?   Breath sounds: Normal breath sounds.  ?Abdominal:  ?   General: There is no distension.  ?   Palpations: Abdomen is soft.  ?   Tenderness: There is no abdominal tenderness. There is no guarding.  ?Skin: ?   General: Skin is warm and dry.  ?Neurological:  ?   Mental Status: He is alert and oriented to person, place, and time.  ?   Comments: Mental Status:  ?Alert, thought content appropriate, able to give a coherent history. Speech fluent without evidence of aphasia. Able  to follow 2 step commands without difficulty.  ?Cranial Nerves:  ?II:  pupils equal, round, reactive to light ?III,IV, VI: ptosis not present, extra-ocular motions intact bilaterally  ?V,VII: smile symmetric, facial light touch sensation equal ?VIII: hearing grossly normal to voice  ?X: uvula elevates symmetrically  ?XI: bilateral shoulder shrug symmetric and strong ?XII: midline tongue extension without fassiculations ?Motor:  ?Normal tone. 5/5 strength of BUE and BLE major muscle groups  ?  ? ? ?ED Results / Procedures / Treatments   ?Labs ?(all labs ordered are listed, but only abnormal results are displayed) ?Labs Reviewed  ?RESP PANEL BY RT-PCR (FLU A&B, COVID) [id number]  ? ? ?EKG ?None ? ?Radiology ?CT Head Wo Contrast ? ?Result Date: 02/10/2022 ?CLINICAL DATA:  Sudden onset severe headache, generalized body aches, fever EXAM: CT HEAD  WITHOUT CONTRAST TECHNIQUE: Contiguous axial images were obtained from the base of the skull through the vertex without intravenous contrast. RADIATION DOSE REDUCTION: This exam was performed according to the departmental dose-optimization program which includes automated exposure control, adjustment of the mA and/or kV according to patient size and/or use of iterative reconstruction technique. COMPARISON:  03/19/2021, 02/16/2021, 02/15/2021 FINDINGS: Brain: In the right cerebellar hemisphere there is a 0.8 cm focus of increased attenuation, at a site of previous intraparenchymal hemorrhage. There is also some faint cortical hyperdensity in the right temporal region reference image 12/2, at the site of prior intraparenchymal hemorrhage. These areas likely reflect calcification rather than recurrent hemorrhage. No other areas of acute hemorrhage are identified. No evidence of acute infarct. The lateral ventricles and midline structures are unremarkable. There are no acute extra-axial fluid collections. There is no mass effect. Vascular: No hyperdense vessel or unexpected calcification. Skull: Normal. Negative for fracture or focal lesion. Sinuses/Orbits: No acute finding. Other: None. IMPRESSION: 1. Subtle areas of increased attenuation within the right temporal cortex and right cerebellar hemisphere, at sites of previous intraparenchymal hemorrhage. The appearance favors calcification at the site of retracted clot rather than acute hemorrhage. If further evaluation is desired, serial CT or follow-up MRI could be considered. 2. No evidence of acute infarct. These results were called by telephone at the time of interpretation on 02/10/2022 at 4:12 pm to provider [facility] , who verbally acknowledged these results. Electronically Signed   By: Sharlet Salina M.D.   On: 02/10/2022 16:12  ? ?MR BRAIN WO CONTRAST ? ?Result Date: 02/10/2022 ?CLINICAL DATA:  Headache, sudden, severe. EXAM: MRI HEAD WITHOUT CONTRAST  TECHNIQUE: Multiplanar, multiecho pulse sequences of the brain and surrounding structures were obtained without intravenous contrast. COMPARISON:  Head CT 02/10/2022 and MRI 02/16/2021 FINDINGS: Brain: There is no evidence of an acute infarct, mass, midline shift, or extra-axial fluid collection. Chronic blood products and mild encephalomalacia are noted at the sites of the remote right cerebellar and right temporal lobe hemorrhages. No definite acute hemorrhage or edema is identified. The brain is normal in signal elsewhere. The ventricles are normal in size. Vascular: Major intracranial vascular flow voids are preserved. Skull and upper cervical spine: No suspicious calvarial lesion. Chronically diminished bone marrow T1 signal intensity in the included cervical spine, nonspecific. Sinuses/Orbits: Unremarkable orbits. Paranasal sinuses and mastoid air cells are clear. Undo clear paranasal sinuses. Trace left mastoid fluid. Other: None. IMPRESSION: 1. No acute intracranial abnormality. 2. Remote right cerebellar and right temporal lobe hemorrhages. Electronically Signed   By: Sebastian Ache M.D.   On: 02/10/2022 18:21   ? ?Procedures ?Procedures  ? ? ?Medications Ordered in ED ?  Medications  ?ibuprofen (ADVIL) tablet 600 mg (600 mg Oral Given 02/10/22 1543)  ?acetaminophen (TYLENOL) tablet 650 mg (650 mg Oral Given 02/10/22 1723)  ? ? ?ED Course/ Medical Decision Making/ A&P ?  ?                        ?Medical Decision Making ?Amount and/or Complexity of Data Reviewed ?Radiology: ordered. ? ?Risk ?OTC drugs. ? ? ?This patient presents to the ED for concern of headache, fevers, this involves an extensive number of treatment options, and is a complaint that carries with it a high risk of complications and morbidity.  The differential diagnosis includes but is not limited to viral uri, meningitis, ich, other infectious cause  ? ?Comorbidities that complicate the patient evaluation: ?Patient?s presentation is complicated  by their history of intracranial hemorrhage, vte ? ?Additional history obtained: ?Records reviewed Care Everywhere/External Records ? ?Lab Tests: ?I Ordered, and personally interpreted labs.  The pertinent results in

## 2022-02-10 NOTE — ED Notes (Signed)
Pt has returned from MRI, he continues to have a temp of 102.6 and headache ?

## 2022-02-10 NOTE — ED Notes (Signed)
Pt in MRI.

## 2022-02-10 NOTE — ED Notes (Signed)
Pt medicated as ordered prior to discharge, pt verbalizes understanding discharge instructions.  ?

## 2022-02-10 NOTE — ED Triage Notes (Signed)
Patient reports that he has had generalized body aches, fever, and a headache since last night. ? ?Patient states he last took Tylenol ES 2 tabs at 1100 today. ?

## 2022-02-10 NOTE — Discharge Instructions (Addendum)
You may alternate taking Tylenol and Ibuprofen as needed for pain control. You may take 400-600 mg of ibuprofen every 6 hours and [id number] mg of Tylenol every 6 hours. Do not exceed 4000 mg of Tylenol daily as this can lead to liver damage. Also, make sure to take Ibuprofen with meals as it can cause an upset stomach. Do not take other NSAIDs while taking Ibuprofen such as (Aleve, Naprosyn, Aspirin, Celebrex, etc) and do not take more than the prescribed dose as this can lead to ulcers and bleeding in your GI tract. You may use warm and cold compresses to help with your symptoms.  ? ?Please follow up with your primary doctor within the next 7-10 days for re-evaluation and further treatment of your symptoms.  ? ?Please return to the ER sooner if you have any new or worsening symptoms. ? ?Please follow up with your primary doctor within the next 7-10 days for re-evaluation and further treatment of your symptoms.  ? ?Please return to the ER sooner if you have any new or worsening symptoms. ? ?

## 2022-02-11 ENCOUNTER — Other Ambulatory Visit: Payer: Self-pay

## 2022-02-11 ENCOUNTER — Emergency Department (HOSPITAL_COMMUNITY): Payer: No Typology Code available for payment source

## 2022-02-11 ENCOUNTER — Inpatient Hospital Stay (HOSPITAL_COMMUNITY)
Admission: EM | Admit: 2022-02-11 | Discharge: 2022-02-14 | DRG: 871 | Disposition: A | Discharge status: Home / Self Care | Payer: No Typology Code available for payment source | Attending: Internal Medicine | Admitting: Internal Medicine

## 2022-02-11 ENCOUNTER — Encounter (HOSPITAL_COMMUNITY): Payer: Self-pay | Admitting: Pharmacy Technician

## 2022-02-11 DIAGNOSIS — Z9852 Vasectomy status: Secondary | ICD-10-CM

## 2022-02-11 DIAGNOSIS — Z8673 Personal history of transient ischemic attack (TIA), and cerebral infarction without residual deficits: Secondary | ICD-10-CM

## 2022-02-11 DIAGNOSIS — Z86711 Personal history of pulmonary embolism: Secondary | ICD-10-CM

## 2022-02-11 DIAGNOSIS — Z79899 Other long term (current) drug therapy: Secondary | ICD-10-CM

## 2022-02-11 DIAGNOSIS — E871 Hypo-osmolality and hyponatremia: Secondary | ICD-10-CM | POA: Diagnosis present

## 2022-02-11 DIAGNOSIS — Z888 Allergy status to other drugs, medicaments and biological substances status: Secondary | ICD-10-CM

## 2022-02-11 DIAGNOSIS — Z20822 Contact with and (suspected) exposure to covid-19: Secondary | ICD-10-CM | POA: Diagnosis present

## 2022-02-11 DIAGNOSIS — Z86718 Personal history of other venous thrombosis and embolism: Secondary | ICD-10-CM

## 2022-02-11 DIAGNOSIS — E861 Hypovolemia: Secondary | ICD-10-CM | POA: Diagnosis present

## 2022-02-11 DIAGNOSIS — Z7901 Long term (current) use of anticoagulants: Secondary | ICD-10-CM

## 2022-02-11 DIAGNOSIS — E876 Hypokalemia: Secondary | ICD-10-CM | POA: Diagnosis not present

## 2022-02-11 DIAGNOSIS — J189 Pneumonia, unspecified organism: Secondary | ICD-10-CM

## 2022-02-11 DIAGNOSIS — Z87891 Personal history of nicotine dependence: Secondary | ICD-10-CM

## 2022-02-11 DIAGNOSIS — N179 Acute kidney failure, unspecified: Secondary | ICD-10-CM | POA: Diagnosis present

## 2022-02-11 DIAGNOSIS — A419 Sepsis, unspecified organism: Principal | ICD-10-CM

## 2022-02-11 DIAGNOSIS — J181 Lobar pneumonia, unspecified organism: Secondary | ICD-10-CM | POA: Diagnosis present

## 2022-02-11 DIAGNOSIS — Z8679 Personal history of other diseases of the circulatory system: Secondary | ICD-10-CM

## 2022-02-11 DIAGNOSIS — Z95828 Presence of other vascular implants and grafts: Secondary | ICD-10-CM

## 2022-02-11 DIAGNOSIS — R197 Diarrhea, unspecified: Secondary | ICD-10-CM | POA: Diagnosis present

## 2022-02-11 DIAGNOSIS — R569 Unspecified convulsions: Secondary | ICD-10-CM

## 2022-02-11 DIAGNOSIS — F32A Depression, unspecified: Secondary | ICD-10-CM | POA: Diagnosis present

## 2022-02-11 DIAGNOSIS — G40909 Epilepsy, unspecified, not intractable, without status epilepticus: Secondary | ICD-10-CM | POA: Diagnosis present

## 2022-02-11 DIAGNOSIS — J129 Viral pneumonia, unspecified: Secondary | ICD-10-CM | POA: Diagnosis present

## 2022-02-11 LAB — COMPREHENSIVE METABOLIC PANEL
ALT: 53 U/L — ABNORMAL HIGH (ref 0–44)
AST: 89 U/L — ABNORMAL HIGH (ref 15–41)
Albumin: 3.6 g/dL (ref 3.5–5.0)
Alkaline Phosphatase: 40 U/L (ref 38–126)
Anion gap: 13 (ref 5–15)
BUN: 11 mg/dL (ref 6–20)
CO2: 24 mmol/L (ref 22–32)
Calcium: 8.9 mg/dL (ref 8.9–10.3)
Chloride: 92 mmol/L — ABNORMAL LOW (ref 98–111)
Creatinine, Ser: 1.89 mg/dL — ABNORMAL HIGH (ref 0.61–1.24)
GFR, Estimated: 43 mL/min — ABNORMAL LOW (ref >60)
Glucose, Bld: 130 mg/dL — ABNORMAL HIGH (ref 70–99)
Potassium: 3.9 mmol/L (ref 3.5–5.1)
Sodium: 129 mmol/L — ABNORMAL LOW (ref 135–145)
Total Bilirubin: 1.3 mg/dL — ABNORMAL HIGH (ref 0.3–1.2)
Total Protein: 8.1 g/dL (ref 6.5–8.1)

## 2022-02-11 LAB — CBC WITH DIFFERENTIAL/PLATELET
Abs Immature Granulocytes: 0.07 10*3/uL (ref 0.00–0.07)
Basophils Absolute: 0.1 10*3/uL (ref 0.0–0.1)
Basophils Relative: 1 %
Eosinophils Absolute: 0 10*3/uL (ref 0.0–0.5)
Eosinophils Relative: 0 %
HCT: 54.7 % — ABNORMAL HIGH (ref 39.0–52.0)
Hemoglobin: 18.3 g/dL — ABNORMAL HIGH (ref 13.0–17.0)
Immature Granulocytes: 1 %
Lymphocytes Relative: 9 %
Lymphs Abs: 1 10*3/uL (ref 0.7–4.0)
MCH: 31.1 pg (ref 26.0–34.0)
MCHC: 33.5 g/dL (ref 30.0–36.0)
MCV: 93 fL (ref 80.0–100.0)
Monocytes Absolute: 0.2 10*3/uL (ref 0.1–1.0)
Monocytes Relative: 2 %
Neutro Abs: 9.8 10*3/uL — ABNORMAL HIGH (ref 1.7–7.7)
Neutrophils Relative %: 87 %
Platelets: 122 10*3/uL — ABNORMAL LOW (ref 150–400)
RBC: 5.88 MIL/uL — ABNORMAL HIGH (ref 4.22–5.81)
RDW: 14 % (ref 11.5–15.5)
WBC: 11.2 10*3/uL — ABNORMAL HIGH (ref 4.0–10.5)
nRBC: 0 % (ref 0.0–0.2)

## 2022-02-11 LAB — URINALYSIS, ROUTINE W REFLEX MICROSCOPIC
Bacteria, UA: NONE SEEN
Bilirubin Urine: NEGATIVE
Glucose, UA: NEGATIVE mg/dL
Ketones, ur: 20 mg/dL — AB
Leukocytes,Ua: NEGATIVE
Nitrite: NEGATIVE
Protein, ur: 100 mg/dL — AB
Specific Gravity, Urine: 1.025 (ref 1.005–1.030)
pH: 6 (ref 5.0–8.0)

## 2022-02-11 LAB — RESP PANEL BY RT-PCR (FLU A&B, COVID) ARPGX2
Influenza A by PCR: NEGATIVE
Influenza B by PCR: NEGATIVE
SARS Coronavirus 2 by RT PCR: NEGATIVE

## 2022-02-11 LAB — LACTIC ACID, PLASMA: Lactic Acid, Venous: 1.7 mmol/L (ref 0.5–1.9)

## 2022-02-11 LAB — PROTIME-INR
INR: 1.5 — ABNORMAL HIGH (ref 0.8–1.2)
Prothrombin Time: 17.7 seconds — ABNORMAL HIGH (ref 11.4–15.2)

## 2022-02-11 MED ORDER — IOHEXOL 300 MG/ML  SOLN
80.0000 mL | Freq: Once | INTRAMUSCULAR | Status: AC | PRN
  Administered 2022-02-11: 80 mL via INTRAVENOUS

## 2022-02-11 MED ORDER — SODIUM CHLORIDE 0.9 % IV SOLN
500.0000 mg | INTRAVENOUS | Status: DC
  Administered 2022-02-12: 500 mg via INTRAVENOUS
  Filled 2022-02-11: qty 5

## 2022-02-11 MED ORDER — LACTATED RINGERS IV SOLN: INTRAVENOUS | Status: AC

## 2022-02-11 MED ORDER — ACETAMINOPHEN 500 MG PO TABS
1000.0000 mg | ORAL_TABLET | Freq: Once | ORAL | Status: AC
  Administered 2022-02-11: 1000 mg via ORAL
  Filled 2022-02-11: qty 2

## 2022-02-11 MED ORDER — SODIUM CHLORIDE 0.9 % IV SOLN
2.0000 g | INTRAVENOUS | Status: DC
  Administered 2022-02-12: 2 g via INTRAVENOUS
  Filled 2022-02-11: qty 20

## 2022-02-11 MED ORDER — MORPHINE SULFATE (PF) 4 MG/ML IV SOLN: 4.0000 mg | Freq: Once | INTRAVENOUS | Status: DC

## 2022-02-11 MED ORDER — LACTATED RINGERS IV BOLUS
1000.0000 mL | Freq: Once | INTRAVENOUS | Status: AC
Start: 2022-02-11 — End: 2022-02-11
  Administered 2022-02-11: 1000 mL via INTRAVENOUS

## 2022-02-11 MED ORDER — LACTATED RINGERS IV BOLUS (SEPSIS)
2000.0000 mL | Freq: Once | INTRAVENOUS | Status: AC
  Administered 2022-02-12: 2000 mL via INTRAVENOUS

## 2022-02-11 NOTE — ED Triage Notes (Signed)
Pt here with reports of fevers X1 week. Pt states he has a ICGF filter place a year ago and they tried to remove it several weeks ago unsuccessfully. Pt afraid this is what is infected.  ?

## 2022-02-11 NOTE — ED Provider Notes (Signed)
[facility] EMERGENCY DEPARTMENT Provider Note   CSN: [id number] Arrival date & time: 02/11/22  1736     History  Chief Complaint  Patient presents with   Fever    Flynt Schreckengost is a 51 y.o. male.  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  Patient states he has been experiencing high fevers at home, Tmax of 104.  Has been taking Tylenol and ibuprofen, inconsistently but with no improvement of fever below 101.  He endorses headache, fatigue, decreased appetite.  He has also been experiencing scattered abdominal pain associated with diarrhea and blood when he wipes.  No previous surgeries in the abdomen.  Patient denies any chest pain, cough.  Has been experiencing sinus congestion and body aches.  Has tested flu and COVID-negative.  He has been compliant with his oral anticoagulation.  This is what led them to attempt to remove the IVC filter.  This procedure was done last month February/2023 however there was a complication and they were unable to remove this filter.  He is rescheduled for another attempt as an outpatient.  Home Medications Prior to Admission medications   Medication Sig Start Date End Date Taking? Authorizing Provider  busPIRone (BUSPAR) 10 MG tablet Take 10 mg by mouth daily. OK to use lower dose (1/2 tablet by mouth 3 times daily) if anxiety is not so bad.    [provider]  docusate sodium (COLACE) 100 MG capsule Take 1 capsule (100 mg total) by mouth 2 (two) times daily. 02/20/21   Lanae Boast, MD  ELIQUIS 5 MG TABS tablet Take 5 mg by mouth daily. 09/23/21   [provider]  folic acid (FOLVITE) 1 MG tablet TAKE 1 TABLET (1 MG TOTAL) BY MOUTH DAILY. 02/20/21 02/20/22  Lanae Boast, MD  hydrOXYzine (VISTARIL) 25 MG capsule Take 25-50 mg by mouth See admin instructions. Take 1 capsule  (25mg ) by mouth twice daily as needed for anxiety and take 1-2 capsules (25-50mg ) by mouth at bedtime as needed for sleep.    [provider]  sertraline (ZOLOFT) 25 MG tablet Take 25 mg by mouth daily.    [provider]  traZODone (DESYREL) 100 MG tablet Take 100 mg by mouth at bedtime as needed for sleep.    [provider]  zonisamide (ZONEGRAN) 100 MG capsule Take 3 capsules every night 09/25/21   Van Clines, MD      Allergies    Keppra [levetiracetam]    Review of Systems   Review of Systems  Constitutional:  Positive for appetite change, fatigue and fever.  HENT:  Positive for rhinorrhea, sinus pressure and sinus pain.   Eyes:  Negative for photophobia.  Respiratory:  Negative for shortness of breath.   Cardiovascular:  Negative for chest pain.  Gastrointestinal:  Positive for abdominal pain, blood in stool, diarrhea and nausea. Negative for rectal pain and vomiting.  Musculoskeletal:  Negative for neck pain and neck stiffness.  Skin:  Negative for rash.  Neurological:  Positive for headaches.  Psychiatric/Behavioral:  Negative for confusion.    Physical Exam Updated Vital Signs BP 128/79   Pulse (!) 111   Temp (!) 100.9 F (38.3 C) (Oral)   Resp (!) 21   SpO2 96%  Physical Exam Vitals and nursing note reviewed.  Constitutional:      Appearance: Normal  appearance. He is ill-appearing. He is not diaphoretic.  HENT:     Head: Normocephalic.     Nose: Congestion present.     Mouth/Throat:     Mouth: Mucous membranes are moist.  Eyes:     Pupils: Pupils are equal, round, and reactive to light.  Cardiovascular:     Rate and Rhythm: Tachycardia present.  Pulmonary:     Effort: Pulmonary effort is normal. No respiratory distress.     Breath sounds: No rales.  Abdominal:     General: There is no distension.     Palpations: Abdomen is soft.     Tenderness: There is no abdominal tenderness.  Musculoskeletal:        General: No deformity.      Cervical back: No rigidity or tenderness.  Skin:    General: Skin is warm.     Findings: No rash.  Neurological:     Mental Status: He is alert and oriented to person, place, and time. Mental status is at baseline.  Psychiatric:        Mood and Affect: Mood normal.    ED Results / Procedures / Treatments   Labs (all labs ordered are listed, but only abnormal results are displayed) Labs Reviewed  CBC WITH DIFFERENTIAL/PLATELET - Abnormal; Notable for the following components:      Result Value   WBC 11.2 (*)    RBC 5.88 (*)    Hemoglobin 18.3 (*)    HCT 54.7 (*)    Platelets 122 (*)    Neutro Abs 9.8 (*)    All other components within normal limits  COMPREHENSIVE METABOLIC PANEL - Abnormal; Notable for the following components:   Sodium 129 (*)    Chloride 92 (*)    Glucose, Bld 130 (*)    Creatinine, Ser 1.89 (*)    AST 89 (*)    ALT 53 (*)    Total Bilirubin 1.3 (*)    GFR, Estimated 43 (*)    All other components within normal limits  PROTIME-INR - Abnormal; Notable for the following components:   Prothrombin Time 17.7 (*)    INR 1.5 (*)    All other components within normal limits  CULTURE, BLOOD (ROUTINE X 2)  CULTURE, BLOOD (ROUTINE X 2)  RESP PANEL BY RT-PCR (FLU A&B, COVID) ARPGX2  LACTIC ACID, PLASMA  LACTIC ACID, PLASMA  URINALYSIS, ROUTINE W REFLEX MICROSCOPIC    EKG None  Radiology CT Head Wo Contrast  Result Date: 02/10/2022 CLINICAL DATA:  Sudden onset severe headache, generalized body aches, fever EXAM: CT HEAD WITHOUT CONTRAST TECHNIQUE: Contiguous axial images were obtained from the base of the skull through the vertex without intravenous contrast. RADIATION DOSE REDUCTION: This exam was performed according to the departmental dose-optimization program which includes automated exposure control, adjustment of the mA and/or kV according to patient size and/or use of iterative reconstruction technique. COMPARISON:  03/19/2021, 02/16/2021,  02/15/2021 FINDINGS: Brain: In the right cerebellar hemisphere there is a 0.8 cm focus of increased attenuation, at a site of previous intraparenchymal hemorrhage. There is also some faint cortical hyperdensity in the right temporal region reference image 12/2, at the site of prior intraparenchymal hemorrhage. These areas likely reflect calcification rather than recurrent hemorrhage. No other areas of acute hemorrhage are identified. No evidence of acute infarct. The lateral ventricles and midline structures are unremarkable. There are no acute extra-axial fluid collections. There is no mass effect. Vascular: No hyperdense vessel or unexpected calcification. Skull: Normal. Negative  for fracture or focal lesion. Sinuses/Orbits: No acute finding. Other: None. IMPRESSION: 1. Subtle areas of increased attenuation within the right temporal cortex and right cerebellar hemisphere, at sites of previous intraparenchymal hemorrhage. The appearance favors calcification at the site of retracted clot rather than acute hemorrhage. If further evaluation is desired, serial CT or follow-up MRI could be considered. 2. No evidence of acute infarct. These results were called by telephone at the time of interpretation on 02/10/2022 at 4:12 pm to provider [facility] , who verbally acknowledged these results. Electronically Signed   By: Sharlet Salina M.D.   On: 02/10/2022 16:12   MR BRAIN WO CONTRAST  Result Date: 02/10/2022 CLINICAL DATA:  Headache, sudden, severe. EXAM: MRI HEAD WITHOUT CONTRAST TECHNIQUE: Multiplanar, multiecho pulse sequences of the brain and surrounding structures were obtained without intravenous contrast. COMPARISON:  Head CT 02/10/2022 and MRI 02/16/2021 FINDINGS: Brain: There is no evidence of an acute infarct, mass, midline shift, or extra-axial fluid collection. Chronic blood products and mild encephalomalacia are noted at the sites of the remote right cerebellar and right temporal lobe hemorrhages. No  definite acute hemorrhage or edema is identified. The brain is normal in signal elsewhere. The ventricles are normal in size. Vascular: Major intracranial vascular flow voids are preserved. Skull and upper cervical spine: No suspicious calvarial lesion. Chronically diminished bone marrow T1 signal intensity in the included cervical spine, nonspecific. Sinuses/Orbits: Unremarkable orbits. Paranasal sinuses and mastoid air cells are clear. Undo clear paranasal sinuses. Trace left mastoid fluid. Other: None. IMPRESSION: 1. No acute intracranial abnormality. 2. Remote right cerebellar and right temporal lobe hemorrhages. Electronically Signed   By: Sebastian Ache M.D.   On: 02/10/2022 18:21    Procedures .Critical Care Performed by: Rozelle Logan, DO Authorized by: Rozelle Logan, DO   Critical care provider statement:    Critical care time (minutes):  45   Critical care time was exclusive of:  Separately billable procedures and treating other patients   Critical care was necessary to treat or prevent imminent or life-threatening deterioration of the following conditions:  Sepsis   Critical care was time spent personally by me on the following activities:  Development of treatment plan with patient or surrogate, discussions with consultants, evaluation of patient's response to treatment, examination of patient, ordering and review of laboratory studies, ordering and review of radiographic studies, ordering and performing treatments and interventions, pulse oximetry, re-evaluation of patient's condition and review of old charts   I assumed direction of critical care for this patient from another provider in my specialty: no     Care discussed with: admitting provider      Medications Ordered in ED Medications  lactated ringers bolus 1,000 mL (has no administration in time range)  acetaminophen (TYLENOL) tablet 1,000 mg (1,000 mg Oral Given 02/11/22 1859)    ED Course/ Medical Decision Making/  A&P                           Medical Decision Making Amount and/or Complexity of Data Reviewed Labs: ordered. Radiology: ordered.  Risk Prescription drug management. Decision regarding hospitalization.   This patient presents to the ED for concern of infection/fever, this involves an extensive number of treatment options, and is a complaint that carries with it a high risk of complications and morbidity.  The differential diagnosis includes viral infection, upper respiratory infection, diarrhea/abdominal infection, flu/COVID, fever, meningitis   Additional history obtained: -External records from  outside source obtained and reviewed including: Chart review including previous notes, labs, imaging, consultation notes   Lab Tests: -I ordered, reviewed, and interpreted labs.  The pertinent results include: Leukocytosis, mildly elevated LFTs, normal lactic   EKG -Sinus tachycardia   Imaging Studies ordered: -I ordered imaging studies including chest x-ray -I independently visualized and interpreted imaging which showed patchy opacities consistent with possible pneumonia -I agree with the radiologist interpretation   Medicines ordered and prescription drug management: -I ordered medication including IV fluids and antibiotics per septic protocol for presentation -Reevaluation of the patient after these medicines showed that the patient improved -I have reviewed the patients home medicines and have made adjustments as needed   ED Course: 51 year old male presents emergency department with fever for the past week, Tmax 104.  Main complaints of headache, sinus congestion, abdominal pain, diarrhea.  No neck pain or stiffness, does not appear meningitic on arrival.  He is febrile and tachycardic.  He has a mild white count, normal lactic, no findings of septic shock.  Work-up appears consistent for possible pneumonia etiology.  We will plan for CT imaging of the chest abdomen pelvis  given his abdominal pain/diarrhea.  Tachycardia improving with IV fluids, stable for floor admit.   Critical Interventions: IV fluids, septic protocol/treatment   Cardiac Monitoring: The patient was maintained on a cardiac monitor.  I personally viewed and interpreted the cardiac monitored which showed an underlying rhythm of: Sinus tachycardia   Reevaluation: After the interventions noted above, I reevaluated the patient and found that they have :improved   Dispostion: Patients evaluation and results requires admission for further treatment and care.  Spoke with hospitalist Dr. Imogene Burn, reviewed patient's ED course and they accept admission.  Patient agrees with admission plan, offers no new complaints and is stable/unchanged at time of admit.        Final Clinical Impression(s) / ED Diagnoses Final diagnoses:  None    Rx / DC Orders ED Discharge Orders     None         Rozelle Logan, DO 02/11/22 2353

## 2022-02-11 NOTE — ED Provider Triage Note (Signed)
Emergency Medicine Provider Triage Evaluation Note ? ?Danny Lowe , a 51 y.o. male  was evaluated in triage.  Pt complains of body aches, congestion, fever that is been ongoing for the past 2 days.  He was given Tylenol, ibuprofen has been taking this Q6 without any improvement in symptoms.  He is also endorsing some pain along his left flank, reports some decrease in urination.  He is currently on Eliquis for a prior PE.  ? ?Review of Systems  ?Positive: Fever, left flank pain ?Negative: Chest pain, shortness of breath, vomiting ? ?Physical Exam  ?There were no vitals taken for this visit. ?Gen:   Awake, no distress   ?Resp:  Normal effort  ?MSK:   Moves extremities without difficulty  ?Other:   ? ?Medical Decision Making  ?Medically screening exam initiated at 6:31 PM.  Appropriate orders placed.  Onesimo Lingard was informed that the remainder of the evaluation will be completed by another provider, this initial triage assessment does not replace that evaluation, and the importance of remaining in the ED until their evaluation is complete. ? ? ?  ?Claude Manges, PA-C ?02/11/22 1836 ? ?

## 2022-02-12 ENCOUNTER — Encounter (HOSPITAL_COMMUNITY): Payer: Self-pay | Admitting: Internal Medicine

## 2022-02-12 DIAGNOSIS — A419 Sepsis, unspecified organism: Secondary | ICD-10-CM | POA: Diagnosis present

## 2022-02-12 DIAGNOSIS — Z86718 Personal history of other venous thrombosis and embolism: Secondary | ICD-10-CM | POA: Diagnosis not present

## 2022-02-12 DIAGNOSIS — J181 Lobar pneumonia, unspecified organism: Secondary | ICD-10-CM | POA: Diagnosis present

## 2022-02-12 DIAGNOSIS — G40909 Epilepsy, unspecified, not intractable, without status epilepticus: Secondary | ICD-10-CM | POA: Diagnosis present

## 2022-02-12 DIAGNOSIS — Z86711 Personal history of pulmonary embolism: Secondary | ICD-10-CM | POA: Diagnosis not present

## 2022-02-12 DIAGNOSIS — E871 Hypo-osmolality and hyponatremia: Secondary | ICD-10-CM | POA: Diagnosis present

## 2022-02-12 DIAGNOSIS — Z7901 Long term (current) use of anticoagulants: Secondary | ICD-10-CM | POA: Diagnosis not present

## 2022-02-12 DIAGNOSIS — E861 Hypovolemia: Secondary | ICD-10-CM | POA: Diagnosis present

## 2022-02-12 DIAGNOSIS — Z79899 Other long term (current) drug therapy: Secondary | ICD-10-CM | POA: Diagnosis not present

## 2022-02-12 DIAGNOSIS — Z8673 Personal history of transient ischemic attack (TIA), and cerebral infarction without residual deficits: Secondary | ICD-10-CM | POA: Diagnosis not present

## 2022-02-12 DIAGNOSIS — Z87891 Personal history of nicotine dependence: Secondary | ICD-10-CM | POA: Diagnosis not present

## 2022-02-12 DIAGNOSIS — J129 Viral pneumonia, unspecified: Secondary | ICD-10-CM | POA: Diagnosis present

## 2022-02-12 DIAGNOSIS — Z9852 Vasectomy status: Secondary | ICD-10-CM | POA: Diagnosis not present

## 2022-02-12 DIAGNOSIS — N179 Acute kidney failure, unspecified: Secondary | ICD-10-CM | POA: Diagnosis present

## 2022-02-12 DIAGNOSIS — Z95828 Presence of other vascular implants and grafts: Secondary | ICD-10-CM | POA: Diagnosis not present

## 2022-02-12 DIAGNOSIS — Z888 Allergy status to other drugs, medicaments and biological substances status: Secondary | ICD-10-CM | POA: Diagnosis not present

## 2022-02-12 DIAGNOSIS — F32A Depression, unspecified: Secondary | ICD-10-CM | POA: Diagnosis present

## 2022-02-12 DIAGNOSIS — R197 Diarrhea, unspecified: Secondary | ICD-10-CM | POA: Diagnosis present

## 2022-02-12 DIAGNOSIS — E876 Hypokalemia: Secondary | ICD-10-CM | POA: Diagnosis not present

## 2022-02-12 DIAGNOSIS — Z20822 Contact with and (suspected) exposure to covid-19: Secondary | ICD-10-CM | POA: Diagnosis present

## 2022-02-12 LAB — COMPREHENSIVE METABOLIC PANEL
ALT: 39 U/L (ref 0–44)
AST: 62 U/L — ABNORMAL HIGH (ref 15–41)
Albumin: 2.6 g/dL — ABNORMAL LOW (ref 3.5–5.0)
Alkaline Phosphatase: 31 U/L — ABNORMAL LOW (ref 38–126)
Anion gap: 11 (ref 5–15)
BUN: 10 mg/dL (ref 6–20)
CO2: 23 mmol/L (ref 22–32)
Calcium: 7.6 mg/dL — ABNORMAL LOW (ref 8.9–10.3)
Chloride: 97 mmol/L — ABNORMAL LOW (ref 98–111)
Creatinine, Ser: 1.51 mg/dL — ABNORMAL HIGH (ref 0.61–1.24)
GFR, Estimated: 56 mL/min — ABNORMAL LOW (ref >60)
Glucose, Bld: 111 mg/dL — ABNORMAL HIGH (ref 70–99)
Potassium: 3.5 mmol/L (ref 3.5–5.1)
Sodium: 131 mmol/L — ABNORMAL LOW (ref 135–145)
Total Bilirubin: 0.9 mg/dL (ref 0.3–1.2)
Total Protein: 5.9 g/dL — ABNORMAL LOW (ref 6.5–8.1)

## 2022-02-12 LAB — CBC WITH DIFFERENTIAL/PLATELET
Abs Immature Granulocytes: 0.02 10*3/uL (ref 0.00–0.07)
Basophils Absolute: 0 10*3/uL (ref 0.0–0.1)
Basophils Relative: 0 %
Eosinophils Absolute: 0 10*3/uL (ref 0.0–0.5)
Eosinophils Relative: 0 %
HCT: 44.3 % (ref 39.0–52.0)
Hemoglobin: 15.6 g/dL (ref 13.0–17.0)
Immature Granulocytes: 0 %
Lymphocytes Relative: 9 %
Lymphs Abs: 0.6 10*3/uL — ABNORMAL LOW (ref 0.7–4.0)
MCH: 32.1 pg (ref 26.0–34.0)
MCHC: 35.2 g/dL (ref 30.0–36.0)
MCV: 91.2 fL (ref 80.0–100.0)
Monocytes Absolute: 0.2 10*3/uL (ref 0.1–1.0)
Monocytes Relative: 3 %
Neutro Abs: 5.6 10*3/uL (ref 1.7–7.7)
Neutrophils Relative %: 88 %
Platelets: 99 10*3/uL — ABNORMAL LOW (ref 150–400)
RBC: 4.86 MIL/uL (ref 4.22–5.81)
RDW: 14.2 % (ref 11.5–15.5)
Smear Review: DECREASED
WBC: 6.5 10*3/uL (ref 4.0–10.5)
nRBC: 0 % (ref 0.0–0.2)

## 2022-02-12 LAB — RESPIRATORY PANEL BY PCR

## 2022-02-12 LAB — PROCALCITONIN: Procalcitonin: 8.69 ng/mL

## 2022-02-12 LAB — MAGNESIUM: Magnesium: 1.1 mg/dL — ABNORMAL LOW (ref 1.7–2.4)

## 2022-02-12 MED ORDER — OXYCODONE HCL 5 MG PO TABS: 5.0000 mg | ORAL_TABLET | ORAL | Status: DC | PRN

## 2022-02-12 MED ORDER — ACETAMINOPHEN 650 MG RE SUPP: 650.0000 mg | Freq: Four times a day (QID) | RECTAL | Status: DC | PRN

## 2022-02-12 MED ORDER — ONDANSETRON HCL 4 MG/2ML IJ SOLN
4.0000 mg | Freq: Four times a day (QID) | INTRAMUSCULAR | Status: DC | PRN
  Administered 2022-02-12: 4 mg via INTRAVENOUS
  Filled 2022-02-12: qty 2

## 2022-02-12 MED ORDER — ONDANSETRON HCL 4 MG PO TABS: 4.0000 mg | ORAL_TABLET | Freq: Four times a day (QID) | ORAL | Status: DC | PRN

## 2022-02-12 MED ORDER — APIXABAN 5 MG PO TABS
5.0000 mg | ORAL_TABLET | Freq: Two times a day (BID) | ORAL | Status: DC
  Administered 2022-02-12 – 2022-02-14 (×6): 5 mg via ORAL
  Filled 2022-02-12 (×6): qty 1

## 2022-02-12 MED ORDER — TRAMADOL HCL 50 MG PO TABS
50.0000 mg | ORAL_TABLET | Freq: Four times a day (QID) | ORAL | Status: DC | PRN
  Administered 2022-02-12 – 2022-02-13 (×2): 50 mg via ORAL
  Filled 2022-02-12 (×2): qty 1

## 2022-02-12 MED ORDER — OXYCODONE HCL 5 MG PO TABS
5.0000 mg | ORAL_TABLET | Freq: Once | ORAL | Status: AC
Start: 2022-02-12 — End: 2022-02-12
  Administered 2022-02-12: 5 mg via ORAL
  Filled 2022-02-12: qty 1

## 2022-02-12 MED ORDER — ACETAMINOPHEN 325 MG PO TABS
650.0000 mg | ORAL_TABLET | Freq: Four times a day (QID) | ORAL | Status: DC | PRN
  Administered 2022-02-12 (×2): 650 mg via ORAL
  Filled 2022-02-12 (×2): qty 2

## 2022-02-12 MED ORDER — CEFTRIAXONE SODIUM 2 G IJ SOLR
2.0000 g | INTRAMUSCULAR | Status: DC
  Administered 2022-02-12 – 2022-02-13 (×2): 2 g via INTRAVENOUS
  Filled 2022-02-12 (×3): qty 20

## 2022-02-12 MED ORDER — MAGNESIUM SULFATE 4 GM/100ML IV SOLN
4.0000 g | Freq: Once | INTRAVENOUS | Status: AC
  Administered 2022-02-12: 4 g via INTRAVENOUS
  Filled 2022-02-12: qty 100

## 2022-02-12 MED ORDER — OXYCODONE HCL 5 MG PO TABS
5.0000 mg | ORAL_TABLET | ORAL | Status: DC | PRN
  Administered 2022-02-14: 5 mg via ORAL
  Filled 2022-02-12: qty 1

## 2022-02-12 MED ORDER — TRAZODONE HCL 50 MG PO TABS: 100.0000 mg | ORAL_TABLET | Freq: Every evening | ORAL | Status: DC | PRN

## 2022-02-12 MED ORDER — ONDANSETRON HCL 4 MG/2ML IJ SOLN
4.0000 mg | Freq: Once | INTRAMUSCULAR | Status: AC
  Administered 2022-02-12: 4 mg via INTRAVENOUS
  Filled 2022-02-12: qty 2

## 2022-02-12 MED ORDER — SODIUM CHLORIDE 0.9 % IV SOLN
500.0000 mg | INTRAVENOUS | Status: DC
  Administered 2022-02-12 – 2022-02-13 (×2): 500 mg via INTRAVENOUS
  Filled 2022-02-12 (×2): qty 5

## 2022-02-12 NOTE — Assessment & Plan Note (Signed)
Meets sepsis with acute organ dysfunction due to AKI, fever and leukocytosis with likely viral pneumonia on CT chest. ?

## 2022-02-12 NOTE — Assessment & Plan Note (Signed)
Pt admits he self-stopped his AEDs in November 2022 without telling his neurologist of any or his healthcare providers. States he did not like the AED were making him feel. No seizure activity since stopping AED. ?

## 2022-02-12 NOTE — Assessment & Plan Note (Signed)
Stable. Pt states he has no residual neurologic deficits. Recent MRI brain shows resolution of ICH. ?

## 2022-02-12 NOTE — Progress Notes (Signed)
? ?  Danny Lowe  [id number] DOB: 1971/01/24 DOA: 02/11/2022 ?PCP: Lois Huxley, PA   ? ?Brief Narrative:  ?51 year old with a history of PE/DVT, intracranial hemorrhage following a fall, and a seizure disorder related to [place] who presented to the ER with 3 days of generalized malaise with severe headaches, upper respiratory symptoms, diarrhea, and fever. ? ?Consultants:  ?None ? ?Code Status: FULL CODE ? ?DVT prophylaxis: ?Eliquis ? ?Interim Hx: ?Patient is seen for follow-up visit. ? ?Assessment & Plan: ? ?Bilateral multilobar pneumonia with sepsis ?Agree that presenting symptoms most consistent with a viral etiology but with recurring fever and progression of symptoms will reinitiate antibiotic empirically -continue volume resuscitation -met sepsis criteria with fever, leukocytosis, and suspected bacterial pulmonary infection -hemodynamically improving ? ?Hyponatremia ?Due to poor intake/hypovolemia -continue volume resuscitation ? ?Severe hypomagnesemia ?Due to poor intake and GI losses -supplement and follow ? ?Acute kidney injury ?Due to insensible volume loss as well as large-scale volume loss related to diarrhea leading to prerenal state -continue to hydrate and follow trend ? ?History of intracranial hemorrhage ?No lasting neurologic deficits -has had follow-up MRI brain which revealed resolution of bleed ? ?History of DVT with PE ?IVC filter in place with recent attempt at filter retrieval which was unsuccessful -continue Eliquis ? ?Seizure ?Felt to be acutely related to [place] -self stopped his AEDs November 2022 -no seizure since that time ? ? ?Family Communication: Spoke with patient's brother at bedside ?Disposition: From home -anticipate ability to return home likely in approximately 48 hours ? ?Objective: ?Blood pressure 116/71, pulse 93, temperature (!) 102.5 ?F (39.2 ?C), temperature source Oral, resp. rate 20, SpO2 97 %. ?No intake or output data in the 24 hours ending 02/12/22 0852 ?There  were no vitals filed for this visit. ? ?Examination: ?Follow-up exam completed ? ?CBC: ?Recent Labs  ?Lab 02/11/22 ?1832 02/12/22 ?0332  ?WBC 11.2* 6.5  ?NEUTROABS 9.8* 5.6  ?HGB 18.3* 15.6  ?HCT 54.7* 44.3  ?MCV 93.0 91.2  ?PLT 122* 99*  ? ?Basic Metabolic Panel: ?Recent Labs  ?Lab 02/11/22 ?1832 02/12/22 ?0332  ?NA 129* 131*  ?K 3.9 3.5  ?CL 92* 97*  ?CO2 24 23  ?GLUCOSE 130* 111*  ?BUN 11 10  ?CREATININE 1.89* 1.51*  ?CALCIUM 8.9 7.6*  ?MG  --  1.1*  ? ?GFR: ?Estimated Creatinine Clearance: 68.5 mL/min (A) (by C-G formula based on SCr of 1.51 mg/dL (H)). ? ?Liver Function Tests: ?Recent Labs  ?Lab 02/11/22 ?1832 02/12/22 ?0332  ?AST 89* 62*  ?ALT 53* 39  ?ALKPHOS 40 31*  ?BILITOT 1.3* 0.9  ?PROT 8.1 5.9*  ?ALBUMIN 3.6 2.6*  ? ? ?Coagulation Profile: ?Recent Labs  ?Lab 02/11/22 ?1832  ?INR 1.5*  ? ? ?HbA1C: ?Hgb A1c MFr Bld  ?Date/Time Value Ref Range Status  ?02/16/2021 01:38 AM 4.8 4.8 - 5.6 % Final  ?  Comment:  ?  (NOTE) ?Pre diabetes:          5.7%-6.4% ? ?Diabetes:              >6.4% ? ?Glycemic control for   <7.0% ?adults with diabetes ?  ? ? ?Scheduled Meds: ? apixaban  5 mg Oral BID  ? ?Continuous Infusions: ? lactated ringers 150 mL/hr at 02/12/22 0313  ? ? ? LOS: 0 days  ? ?Cherene Altes, MD ?[facility] ?Office  [phone] ?Pager - Text Page per Shea Evans ? ?If 7PM-7AM, please contact night-coverage per Amion ?02/12/2022, 8:52 AM ? ? ? ? ?

## 2022-02-12 NOTE — Assessment & Plan Note (Signed)
Likely due to volume depletion from diarrhea. Continue with IVF. Monitor Scr. ?

## 2022-02-12 NOTE — Progress Notes (Signed)
?   02/12/22 1323  ?Assess: MEWS Score  ?Temp (!) 102.5 ?F (39.2 ?C)  ?BP (!) 145/90  ?Pulse Rate 95  ?Resp 18  ?SpO2 98 %  ?Assess: MEWS Score  ?MEWS Temp 2  ?MEWS Systolic 0  ?MEWS Pulse 0  ?MEWS RR 0  ?MEWS LOC 0  ?MEWS Score 2  ?MEWS Score Color Yellow  ?Assess: if the MEWS score is Yellow or Red  ?Were vital signs taken at a resting state? Yes  ?Focused Assessment No change from prior assessment  ?Early Detection of Sepsis Score *See Row Information* Medium  ?MEWS guidelines implemented *See Row Information* Yes  ?Treat  ?MEWS Interventions Administered prn meds/treatments  ?Pain Scale 0-10  ?Pain Score 0  ?Take Vital Signs  ?Increase Vital Sign Frequency  Yellow: Q 2hr X 2 then Q 4hr X 2, if remains yellow, continue Q 4hrs  ?Escalate  ?MEWS: Escalate Yellow: discuss with charge nurse/RN and consider discussing with provider and RRT  ?Notify: Charge Nurse/RN  ?Name of Charge Nurse/RN Notified Camb[place], RN  ?Date Charge Nurse/RN Notified 02/12/22  ?Time Charge Nurse/RN Notified 1330  ?Notify: Provider  ?Provider Name/Title Jetty Duhamel, MD  ?Date Provider Notified 02/12/22  ?Time Provider Notified 1345  ?Notification Type Page  ?Notification Reason Other (Comment) ?(temp 102.5)  ?Provider response See new orders  ?Date of Provider Response 02/12/22  ?Time of Provider Response 1414  ?Document  ?Progress note created (see row info) Yes  ? ? ?

## 2022-02-12 NOTE — Assessment & Plan Note (Signed)
Still has IVC filter. Recent attempt at [facility] retrieval was unsuccessful. Continue with Eliquis. ?

## 2022-02-12 NOTE — Plan of Care (Signed)
  Problem: Education: Goal: Knowledge of General Education information will improve Description: Including pain rating scale, medication(s)/side effects and non-pharmacologic comfort measures Outcome: Progressing   Problem: Health Behavior/Discharge Planning: Goal: Ability to manage health-related needs will improve Outcome: Progressing   Problem: Clinical Measurements: Goal: Respiratory complications will improve Outcome: Progressing   Problem: Activity: Goal: Risk for activity intolerance will decrease Outcome: Progressing   Problem: Nutrition: Goal: Adequate nutrition will be maintained Outcome: Progressing   Problem: Pain Managment: Goal: General experience of comfort will improve Outcome: Progressing   Problem: Safety: Goal: Ability to remain free from injury will improve Outcome: Progressing   Problem: Skin Integrity: Goal: Risk for impaired skin integrity will decrease Outcome: Progressing   

## 2022-02-12 NOTE — Assessment & Plan Note (Signed)
Still has IVC filter. Recent attempt at IVC retrieval was unsuccessful. Continue with Eliquis. ?

## 2022-02-12 NOTE — TOC Initial Note (Signed)
Transition of Care (TOC) - Initial/Assessment Note  ? ? ?Patient Details  ?Name: Danny Lowe ?MRN: [id number] ?Date of Birth: 1971/11/06 ? ?Transition of Care (TOC) CM/SW Contact:    ?Tom-[place], Renea Ee, RN ?Phone Number: ?02/12/2022, 4:29 PM ? ?Clinical Narrative:                 ? ?CM spoke with patient about needs for post hospital transition. Admitted for Viral Pneumonia. From home with his son. Has two children. Has four siblings and mother is supportive with care.  ?Patient states he worked at [employer] and had PE and Seizures this time last year and he was forced into early retirement. States he does all his medical activities through the [place]. CM reached out to [facility], SW and a secured message left to return call. Patient does not have medical insurance and agreed to use [facility] is the [place] will not cover his meds. PCP is Lois Huxley, PA and uses [facility] on [address] if unable to get through the [place].  ?No recommendations or needs noted at this time. Family to transport at discharge. No further TOC needs noted.  ?  ?  ? ? ?Patient Goals and CMS Choice ?  ?  ?  ? ?Expected Discharge Plan and Services ?  ?  ?  ?  ?  ?                ?  ?  ?  ?  ?  ?  ?  ?  ?  ?  ? ?Prior Living Arrangements/Services ?  ?  ?  ?       ?  ?  ?  ?  ? ?Activities of Daily Living ?  ?  ? ?Permission Sought/Granted ?  ?  ?   ?   ?   ?   ? ?Emotional Assessment ?  ?  ?  ?  ?  ?  ? ?Admission diagnosis:  Viral pneumonia [J12.9] ?Sepsis, due to unspecified organism, unspecified whether acute organ dysfunction present ([place]) [A41.9] ?Patient Active Problem List  ? Diagnosis Date Noted  ? Viral pneumonia 02/12/2022  ? Seizure ([place]) 02/12/2022  ? Long term (current) use of anticoagulants 02/12/2022  ? History of pulmonary embolism 02/12/2022  ? History of DVT (deep vein thrombosis) 02/12/2022  ? Hx of intracranial hemorrhage 02/12/2022  ? AKI (acute kidney injury) ([place]) 02/12/2022  ?  Hyponatremia 02/12/2022  ? Sepsis with acute organ dysfunction without septic shock ([place]) 02/12/2022  ? ?PCP:  Lois Huxley, PA ?Pharmacy:   ?[place], [address] ?[place]. ?[place] [address] ?Phone: [phone] Fax: [phone] ? ?[facility] [id number] ?[place] ?[place] [address] ?Phone: [phone] Fax: [phone] ? ?[place] ?[phone] ?[place] [address] ?Phone: [phone] Fax: [phone] ? ? ? ? ?Social Determinants of Health (SDOH) Interventions ?  ? ?Readmission Risk Interventions ?No flowsheet data found. ? ? ?

## 2022-02-12 NOTE — Assessment & Plan Note (Signed)
Admit to observation. Likely viral pneumonia with headache, diarrhea, URI symptoms and son with similar symptoms 4 days ago. Pt did receive IV abx in ED. Check respiratory viral panel. Hold on further ABX for now. Monitor cx. Prn oxycodone for headache. Doubt meningitis. ?

## 2022-02-12 NOTE — Progress Notes (Signed)
Pt arrived to room 5M06 via stretcher from the ED. Received report from [place], Charity fundraiser. See assessment. Will continue to monitor.  ?

## 2022-02-12 NOTE — ED Notes (Signed)
ED TO INPATIENT HANDOFF REPORT ? ?ED Nurse Name and Phone #: Baxter Flattery, RN ? ?S ?Name/Age/Gender ?Danny Lowe ?51 y.o. ?male ?Room/Bed: 039C/039C ? ?Code Status ?  Code Status: Full Code ? ?Home/SNF/Other ?Home ?Patient oriented to: self, place, time, and situation ?Is this baseline? Yes  ? ?Triage Complete: Triage complete  ?Chief Complaint ?Viral pneumonia [J12.9] ? ?Triage Note ?Pt here with reports of fevers X1 week. Pt states he has a ICGF filter place a year ago and they tried to remove it several weeks ago unsuccessfully. Pt afraid this is what is infected.   ? ?Allergies ?Allergies  ?Allergen Reactions  ? Keppra [Levetiracetam] Rash  ?  hallucinations  ? ? ?Level of Care/Admitting Diagnosis ?ED Disposition   ? ? ED Disposition  ?Admit  ? Condition  ?--  ? Comment  ?Hospital Area: [facility] [id number] ? Level of Care: Med-Surg [16] ? May admit patient to [facility] or [facility] if equivalent level of care is available:: Yes ? Covid Evaluation: Confirmed COVID Negative ? Diagnosis: Viral pneumonia [id number] ? Admitting Physician: Thereasa Solo, JEFFREY T [id number] ? Attending Physician: Thereasa Solo, JEFFREY T [id number] ? Estimated length of stay: past midnight tomorrow ? Certification:: I certify this patient will need inpatient services for at least 2 midnights ?  ?  ? ?  ? ? ?B ?Medical/Surgery History ?Past Medical History:  ?Diagnosis Date  ? Depression   ? ICH (intracerebral hemorrhage) ([place]) 02/15/2021  ? Pulmonary embolism ([place]) 01/2021  ? Stroke [facility])   ? ?Past Surgical History:  ?Procedure Laterality Date  ? IR IVC FILTER PLMT / S&I /IMG GUID/MOD SED  02/16/2021  ? IR IVC FILTER RETRIEVAL / S&I /IMG GUID/MOD SED  01/23/2022  ? IR RADIOLOGIST EVAL & MGMT  06/19/2021  ? VASECTOMY Bilateral 2002  ?  ? ?A ?IV Location/Drains/Wounds ?Patient Lines/Drains/Airways Status   ? ? Active Line/Drains/Airways   ? ? Name Placement date Placement time Site Days  ? Peripheral IV 02/11/22 20 G Anterior;Proximal;Right  Forearm 02/11/22  2043  Forearm  1  ? Peripheral IV 02/12/22 20 G Anterior;Left Forearm 02/12/22  0015  Forearm  less than 1  ? ?  ?  ? ?  ? ? ?Intake/Output Last 24 hours ?No intake or output data in the 24 hours ending 02/12/22 1143 ? ?Labs/Imaging ?Results for orders placed or performed during the hospital encounter of 02/11/22 (from the past 48 hour(s))  ?Resp Panel by RT-PCR (Flu A&B, Covid)     Status: None  ? Collection Time: 02/11/22  5:36 PM  ? Specimen: Nasopharyngeal(NP) swabs in vial transport medium  ?Result Value Ref Range  ? SARS Coronavirus 2 by RT PCR NEGATIVE NEGATIVE  ?  Comment: (NOTE) ?SARS-CoV-2 target nucleic acids are NOT DETECTED. ? ?The SARS-CoV-2 RNA is generally detectable in upper respiratory ?specimens during the acute phase of infection. The lowest ?concentration of SARS-CoV-2 viral copies this assay can detect is ?138 copies/mL. A negative result does not preclude SARS-Cov-2 ?infection and should not be used as the sole basis for treatment or ?other patient management decisions. A negative result may occur with  ?improper specimen collection/handling, submission of specimen other ?than nasopharyngeal swab, presence of viral mutation(s) within the ?areas targeted by this assay, and inadequate number of viral ?copies(<138 copies/mL). A negative result must be combined with ?clinical observations, patient history, and epidemiological ?information. The expected result is Negative. ? ?Fact Sheet for Patients:  ?EntrepreneurPulse.com.au ? ?Fact Sheet for Healthcare Providers:  ?IncredibleEmployment.be ? ?  This test is no t yet approved or cleared by the [place] FDA and  ?has been authorized for detection and/or diagnosis of SARS-CoV-2 by ?FDA under an Emergency Use Authorization (EUA). This EUA will remain  ?in effect (meaning this test can be used) for the duration of the ?COVID-19 declaration under Section 564(b)(1) of the Act, 21 ?U.S.C.section  360bbb-3(b)(1), unless the authorization is terminated  ?or revoked sooner.  ? ? ?  ? Influenza A by PCR NEGATIVE NEGATIVE  ? Influenza B by PCR NEGATIVE NEGATIVE  ?  Comment: (NOTE) ?The Xpert Xpress SARS-CoV-2/FLU/RSV plus assay is intended as an aid ?in the diagnosis of influenza from Nasopharyngeal swab specimens and ?should not be used as a sole basis for treatment. Nasal washings and ?aspirates are unacceptable for Xpert Xpress SARS-CoV-2/FLU/RSV ?testing. ? ?Fact Sheet for Patients: ?EntrepreneurPulse.com.au ? ?Fact Sheet for Healthcare Providers: ?IncredibleEmployment.be ? ?This test is not yet approved or cleared by the [place] FDA and ?has been authorized for detection and/or diagnosis of SARS-CoV-2 by ?FDA under an Emergency Use Authorization (EUA). This EUA will remain ?in effect (meaning this test can be used) for the duration of the ?COVID-19 declaration under Section 564(b)(1) of the Act, 21 U.S.C. ?section 360bbb-3(b)(1), unless the authorization is terminated or ?revoked. ? ?Performed at [facility], [place] [address] [place], [place] ?[address] ?  ?CBC with Differential     Status: Abnormal  ? Collection Time: 02/11/22  6:32 PM  ?Result Value Ref Range  ? WBC 11.2 (H) 4.0 - 10.5 K/uL  ? RBC 5.88 (H) 4.22 - 5.81 MIL/uL  ? Hemoglobin 18.3 (H) 13.0 - 17.0 g/dL  ? HCT 54.7 (H) 39.0 - 52.0 %  ? MCV 93.0 80.0 - 100.0 fL  ? MCH 31.1 26.0 - 34.0 pg  ? MCHC 33.5 30.0 - 36.0 g/dL  ? RDW 14.0 11.5 - 15.5 %  ? Platelets 122 (L) 150 - 400 K/uL  ?  Comment: REPEATED TO VERIFY  ? nRBC 0.0 0.0 - 0.2 %  ? Neutrophils Relative % 87 %  ? Neutro Abs 9.8 (H) 1.7 - 7.7 K/uL  ? Lymphocytes Relative 9 %  ? Lymphs Abs 1.0 0.7 - 4.0 K/uL  ? Monocytes Relative 2 %  ? Monocytes Absolute 0.2 0.1 - 1.0 K/uL  ? Eosinophils Relative 0 %  ? Eosinophils Absolute 0.0 0.0 - 0.5 K/uL  ? Basophils Relative 1 %  ? Basophils Absolute 0.1 0.0 - 0.1 K/uL  ? Immature Granulocytes 1 %  ? Abs  Immature Granulocytes 0.07 0.00 - 0.07 K/uL  ?  Comment: Performed at [facility], [place] [address] [place], [place] [address]  ?Comprehensive metabolic panel     Status: Abnormal  ? Collection Time: 02/11/22  6:32 PM  ?Result Value Ref Range  ? Sodium 129 (L) 135 - 145 mmol/L  ? Potassium 3.9 3.5 - 5.1 mmol/L  ? Chloride 92 (L) 98 - 111 mmol/L  ? CO2 24 22 - 32 mmol/L  ? Glucose, Bld 130 (H) 70 - 99 mg/dL  ?  Comment: Glucose reference range applies only to samples taken after fasting for at least 8 hours.  ? BUN 11 6 - 20 mg/dL  ? Creatinine, Ser 1.89 (H) 0.61 - 1.24 mg/dL  ? Calcium 8.9 8.9 - 10.3 mg/dL  ? Total Protein 8.1 6.5 - 8.1 g/dL  ? Albumin 3.6 3.5 - 5.0 g/dL  ? AST 89 (H) 15 - 41 U/L  ?  ALT 53 (H) 0 - 44 U/L  ? Alkaline Phosphatase 40 38 - 126 U/L  ? Total Bilirubin 1.3 (H) 0.3 - 1.2 mg/dL  ? GFR, Estimated 43 (L) >60 mL/min  ?  Comment: (NOTE) ?Calculated using the CKD-EPI Creatinine Equation (2021) ?  ? Anion gap 13 5 - 15  ?  Comment: Performed at [facility], [place] [address] [place], [place] [address]  ?Protime-INR     Status: Abnormal  ? Collection Time: 02/11/22  6:32 PM  ?Result Value Ref Range  ? Prothrombin Time 17.7 (H) 11.4 - 15.2 seconds  ? INR 1.5 (H) 0.8 - 1.2  ?  Comment: (NOTE) ?INR goal varies based on device and disease states. ?Performed at [facility], [place] [address] [place], [place] ?[address] ?  ?Lactic acid, plasma     Status: None  ? Collection Time: 02/11/22  8:18 PM  ?Result Value Ref Range  ? Lactic Acid, Venous 1.7 0.5 - 1.9 mmol/L  ?  Comment: Performed at [facility], [place] [address], [place], [place] [address]  ?Culture, blood (routine x 2)     Status: None (Preliminary result)  ? Collection Time: 02/11/22  8:50 PM  ? Specimen: BLOOD RIGHT FOREARM  ?Result Value Ref Range  ? Specimen Description BLOOD RIGHT FOREARM   ? Special Requests    ?  BOTTLES DRAWN AEROBIC AND ANAEROBIC Blood Culture adequate volume  ? Culture    ?  NO GROWTH < 12  HOURS ?Performed at [facility], [place] [address] [place], [place] [address] ?  ? Report Status PENDING   ?Urinalysis, Routine w reflex microscopic Urine, Clean Catch     Status: Abnormal  ? Collection Time:

## 2022-02-12 NOTE — Assessment & Plan Note (Addendum)
Likely due to diarrhea and hypovolemia.  Continue normal saline. Check TSH ?

## 2022-02-12 NOTE — Subjective & Objective (Addendum)
CC: fever, headache ?HPI: ?51 year old African-American male with a history of PE/DVT, history of intracranial hemorrhage after a fall and his PE last year, history of seizure disorder after his ICH who presents to the ER today with a 3-day history of feeling poorly.  Patient states that his son who is 50 years old who also lives with him had upper respiratory symptoms, diarrhea, fever, cough on Sunday.  Patient developed some feelings of malaise on Monday.  He started having diarrhea on Tuesday along with upper respiratory symptoms.  He developed a headache yesterday.  He came to the ER.  Evaluation was negative.  They thought this was a viral illness and sent the patient home.  He continued to have headaches at home.  He has developed fevers.  He came back to the ER. ? ?Patient's had diarrhea since Monday.  Has had multiple episodes of watery stools.  He has not had any antibiotics recently.  Patient is only been drinking water since Monday.  Has not eaten anything. ? ?Patient has had nausea but no vomiting. ? ?Of note, patient recently had an attempt of removal of his IVC filter.  This failed.  He states radiology is going to try it again.  He was afraid his IVC was infected. ? ?On arrival to the ER, patient was febrile 102.2, heart rate 125, blood pressure 126/94 satting 90% on room air. ? ?Labs showed a white count 11.2, hemoglobin of 18.3, platelets of 122.  87% neutrophils 9% lymphocytes. ? ?Sodium 129 potassium 3.9 bicarb of 24 BUN of 11 creatinine 1.89 ? ?UA is negative.  COVID-negative, flu negative.  Lactic acid normal at 1.7. ? ?CT chest abdomen pelvis showed bilateral upper lobe groundglass opacities.  Multifocal.  No pleural effusions are noted.  There is an IVC filter in place.  No acute process noted in the abdomen or pelvis. ? ?Due to the patient's fever, [facility] hospitalist contacted for admission. ? ?

## 2022-02-12 NOTE — H&P (Addendum)
?History and Physical  ? ? ?Danny Lowe RJJ:[id number] DOB: [birth year] DOA: 02/11/2022 ? ?DOS: the patient was seen and examined on 02/11/2022 ? ?PCP: Wilfrid Lund, PA  ? ?Patient coming from: Home ? ?I have personally briefly reviewed patient's old medical records in [facility] ? ?CC: fever, headache ?HPI: ?51 year old African-American male with a history of PE/DVT, history of intracranial hemorrhage after a fall and his PE last year, history of seizure disorder after his ICH who presents to the ER today with a 3-day history of feeling poorly.  Patient states that his son who is 41 years old who also lives with him had upper respiratory symptoms, diarrhea, fever, cough on Sunday.  Patient developed some feelings of malaise on Monday.  He started having diarrhea on Tuesday along with upper respiratory symptoms.  He developed a headache yesterday.  He came to the ER.  Evaluation was negative.  They thought this was a viral illness and sent the patient home.  He continued to have headaches at home.  He has developed fevers.  He came back to the ER. ? ?Patient's had diarrhea since Monday.  Has had multiple episodes of watery stools.  He has not had any antibiotics recently.  Patient is only been drinking water since Monday.  Has not eaten anything. ? ?Patient has had nausea but no vomiting. ? ?Of note, patient recently had an attempt of removal of his IVC filter.  This failed.  He states radiology is going to try it again.  He was afraid his IVC was infected. ? ?On arrival to the ER, patient was febrile 102.2, heart rate 125, blood pressure 126/94 satting 90% on room air. ? ?Labs showed a white count 11.2, hemoglobin of 18.3, platelets of 122.  87% neutrophils 9% lymphocytes. ? ?Sodium 129 potassium 3.9 bicarb of 24 BUN of 11 creatinine 1.89 ? ?UA is negative.  COVID-negative, flu negative.  Lactic acid normal at 1.7. ? ?CT chest abdomen pelvis showed bilateral upper lobe groundglass opacities.  Multifocal.   No pleural effusions are noted.  There is an IVC filter in place.  No acute process noted in the abdomen or pelvis. ? ?Due to the patient's fever, [facility] hospitalist contacted for admission. ?  ? ?ED Course: febrile to 102 in ER. CT chest with multifocal ground glass opacities. CT abd/pelvis negative. Mild AKI ? ?Review of Systems:  ?Review of Systems  ?Constitutional:  Positive for fever.  ?HENT:  Positive for congestion.   ?Eyes: Negative.   ?Respiratory: Negative.    ?Cardiovascular: Negative.   ?Gastrointestinal:  Positive for diarrhea and nausea. Negative for vomiting.  ?Genitourinary: Negative.   ?Musculoskeletal: Negative.   ?Skin: Negative.   ?Neurological:  Positive for headaches.  ?Endo/Heme/Allergies: Negative.   ?Psychiatric/Behavioral: Negative.    ?All other systems reviewed and are negative. ? ?Past Medical History:  ?Diagnosis Date  ? Depression   ? ICH (intracerebral hemorrhage) ([facility]) 02/15/2021  ? Pulmonary embolism ([facility]) 01/2021  ? Stroke [facility])   ? ? ?Past Surgical History:  ?Procedure Laterality Date  ? IR IVC FILTER PLMT / S&I /IMG GUID/MOD SED  02/16/2021  ? IR IVC FILTER RETRIEVAL / S&I /IMG GUID/MOD SED  01/23/2022  ? IR RADIOLOGIST EVAL & MGMT  06/19/2021  ? VASECTOMY Bilateral 2002  ? ? ? reports that he has never smoked. He has quit using smokeless tobacco.  His smokeless tobacco use included chew. He reports current alcohol use. He reports that he does not use drugs. ? ?  Allergies  ?Allergen Reactions  ? Keppra [Levetiracetam] Rash  ?  hallucinations  ? ? ?Family History  ?Problem Relation Age of Onset  ? Diabetes Father   ? ? ?Prior to Admission medications   ?Medication Sig Start Date End Date Taking? Authorizing Provider  ?acetaminophen (TYLENOL) 500 MG tablet Take 500 mg by mouth every 6 (six) hours as needed for mild pain, fever or headache.   Yes [provider]  ?ELIQUIS 5 MG TABS tablet Take 5 mg by mouth 2 (two) times daily. 09/23/21  Yes [provider]   ?ibuprofen (ADVIL) 200 MG tablet Take 400 mg by mouth every 6 (six) hours as needed for fever or headache.   Yes [provider]  ?busPIRone (BUSPAR) 10 MG tablet Take 10 mg by mouth daily. OK to use lower dose (1/2 tablet by mouth 3 times daily) if anxiety is not so bad. ?Patient not taking: Reported on 02/11/2022    [provider]  ?docusate sodium (COLACE) 100 MG capsule Take 1 capsule (100 mg total) by mouth 2 (two) times daily. ?Patient not taking: Reported on 02/11/2022 02/20/21   Lanae Boast, MD  ?folic acid (FOLVITE) 1 MG tablet TAKE 1 TABLET (1 MG TOTAL) BY MOUTH DAILY. ?Patient not taking: Reported on 02/11/2022 02/20/21 02/20/22  Lanae Boast, MD  ?hydrOXYzine (VISTARIL) 25 MG capsule Take 25-50 mg by mouth See admin instructions. Take 1 capsule (25mg ) by mouth twice daily as needed for anxiety and take 1-2 capsules (25-50mg ) by mouth at bedtime as needed for sleep. ?Patient not taking: Reported on 02/11/2022    [provider]  ?traZODone (DESYREL) 100 MG tablet Take 100 mg by mouth at bedtime as needed for sleep.    [provider]  ?zonisamide (ZONEGRAN) 100 MG capsule Take 3 capsules every night ?Patient not taking: Reported on 02/11/2022 09/25/21   09/27/21, MD  ? ? ?Physical Exam: ?Vitals:  ? 02/11/22 2200 02/11/22 2230 02/11/22 2300 02/11/22 2330  ?BP: 114/75 105/72 103/74 122/81  ?Pulse: 98 (!) 103 (!) 108 (!) 106  ?Resp: (!) 22 18 20 19   ?Temp:      ?TempSrc:      ?SpO2: 90% 96% 97% 97%  ? ? ?Physical Exam ?Vitals and nursing note reviewed.  ?Constitutional:   ?   General: He is not in acute distress. ?   Appearance: Normal appearance. He is normal weight. He is not ill-appearing, toxic-appearing or diaphoretic.  ?HENT:  ?   Head: Normocephalic and atraumatic.  ?   Nose: Congestion present.  ?Eyes:  ?   General:     ?   Right eye: No discharge.     ?   Left eye: No discharge.  ?Cardiovascular:  ?   Rate and Rhythm: Regular rhythm. Tachycardia present.  ?    Pulses: Normal pulses.  ?   Heart sounds: Normal heart sounds.  ?Pulmonary:  ?   Effort: Pulmonary effort is normal. No respiratory distress.  ?   Breath sounds: Normal breath sounds. No wheezing or rales.  ?Abdominal:  ?   General: Abdomen is flat. Bowel sounds are normal. There is no distension.  ?   Tenderness: There is no abdominal tenderness. There is no guarding or rebound.  ?Musculoskeletal:  ?   Right lower leg: No edema.  ?   Left lower leg: No edema.  ?Skin: ?   General: Skin is warm and dry.  ?   Capillary Refill: Capillary refill takes less than  2 seconds.  ?Neurological:  ?   General: No focal deficit present.  ?   Mental Status: He is alert and oriented to person, place, and time.  ?  ? ?Labs on Admission: I have personally reviewed following labs and imaging studies ? ?CBC: ?Recent Labs  ?Lab 02/11/22 ?1832  ?WBC 11.2*  ?NEUTROABS 9.8*  ?HGB 18.3*  ?HCT 54.7*  ?MCV 93.0  ?PLT 122*  ? ?Basic Metabolic Panel: ?Recent Labs  ?Lab 02/11/22 ?1832  ?NA 129*  ?K 3.9  ?CL 92*  ?CO2 24  ?GLUCOSE 130*  ?BUN 11  ?CREATININE 1.89*  ?CALCIUM 8.9  ? ?GFR: ?Estimated Creatinine Clearance: 54.7 mL/min (A) (by C-G formula based on SCr of 1.89 mg/dL (H)). ?Liver Function Tests: ?Recent Labs  ?Lab 02/11/22 ?1832  ?AST 89*  ?ALT 53*  ?ALKPHOS 40  ?BILITOT 1.3*  ?PROT 8.1  ?ALBUMIN 3.6  ? ?No results for input(s): LIPASE, AMYLASE in the last 168 hours. ?No results for input(s): AMMONIA in the last 168 hours. ?Coagulation Profile: ?Recent Labs  ?Lab 02/11/22 ?1832  ?INR 1.5*  ? ?Cardiac Enzymes: ?No results for input(s): CKTOTAL, CKMB, CKMBINDEX, TROPONINI, TROPONINIHS in the last 168 hours. ?BNP (last 3 results) ?No results for input(s): PROBNP in the last 8760 hours. ?HbA1C: ?No results for input(s): HGBA1C in the last 72 hours. ?CBG: ?No results for input(s): GLUCAP in the last 168 hours. ?Lipid Profile: ?No results for input(s): CHOL, HDL, LDLCALC, TRIG, CHOLHDL, LDLDIRECT in the last 72 hours. ?Thyroid Function  Tests: ?No results for input(s): TSH, T4TOTAL, FREET4, T3FREE, THYROIDAB in the last 72 hours. ?Anemia Panel: ?No results for input(s): VITAMINB12, FOLATE, FERRITIN, TIBC, IRON, RETICCTPCT in the last 72 hours. ?Urine anal

## 2022-02-12 NOTE — Progress Notes (Signed)
Sepsis tracking by eLINK 

## 2022-02-12 NOTE — Assessment & Plan Note (Signed)
-   Continue Eliquis 

## 2022-02-13 ENCOUNTER — Inpatient Hospital Stay (HOSPITAL_COMMUNITY): Payer: No Typology Code available for payment source

## 2022-02-13 LAB — COMPREHENSIVE METABOLIC PANEL
ALT: 38 U/L (ref 0–44)
AST: 74 U/L — ABNORMAL HIGH (ref 15–41)
Albumin: 2.6 g/dL — ABNORMAL LOW (ref 3.5–5.0)
Alkaline Phosphatase: 28 U/L — ABNORMAL LOW (ref 38–126)
Anion gap: 11 (ref 5–15)
BUN: 9 mg/dL (ref 6–20)
CO2: 23 mmol/L (ref 22–32)
Calcium: 7.7 mg/dL — ABNORMAL LOW (ref 8.9–10.3)
Chloride: 97 mmol/L — ABNORMAL LOW (ref 98–111)
Creatinine, Ser: 1.19 mg/dL (ref 0.61–1.24)
GFR, Estimated: >60 mL/min (ref >60)
Glucose, Bld: 121 mg/dL — ABNORMAL HIGH (ref 70–99)
Potassium: 3.4 mmol/L — ABNORMAL LOW (ref 3.5–5.1)
Sodium: 131 mmol/L — ABNORMAL LOW (ref 135–145)
Total Bilirubin: 0.6 mg/dL (ref 0.3–1.2)
Total Protein: 5.9 g/dL — ABNORMAL LOW (ref 6.5–8.1)

## 2022-02-13 LAB — CBC
HCT: 43.3 % (ref 39.0–52.0)
Hemoglobin: 14.8 g/dL (ref 13.0–17.0)
MCH: 30.8 pg (ref 26.0–34.0)
MCHC: 34.2 g/dL (ref 30.0–36.0)
MCV: 90.2 fL (ref 80.0–100.0)
Platelets: 96 10*3/uL — ABNORMAL LOW (ref 150–400)
RBC: 4.8 MIL/uL (ref 4.22–5.81)
RDW: 14.1 % (ref 11.5–15.5)
WBC: 4.4 10*3/uL (ref 4.0–10.5)
nRBC: 0 % (ref 0.0–0.2)

## 2022-02-13 LAB — PROCALCITONIN: Procalcitonin: 5.33 ng/mL

## 2022-02-13 LAB — MAGNESIUM: Magnesium: 1.9 mg/dL (ref 1.7–2.4)

## 2022-02-13 MED ORDER — POTASSIUM CHLORIDE CRYS ER 20 MEQ PO TBCR
40.0000 meq | EXTENDED_RELEASE_TABLET | Freq: Two times a day (BID) | ORAL | Status: DC
  Administered 2022-02-13 – 2022-02-14 (×3): 40 meq via ORAL
  Filled 2022-02-13 (×3): qty 2

## 2022-02-13 NOTE — Evaluation (Signed)
Physical Therapy Evaluation & Discharge ?Patient Details ?Name: Danny Lowe ?MRN: [id number] ?DOB: 1971-01-18 ?Today's Date: 02/13/2022 ? ?History of Present Illness ? 51 y/o male presented to ED on 3/15 for fever x 1 week. Seen on 3/14 in ED with similar symptoms but evaluation negative and sent home. Found to have viral pneumonia. PMH: seizures, PE/DVT, hx of ICH  ?Clinical Impression ? Patient admitted with above. Patient functioning at independent level for mobility with no AD. Encouraged patient to continue mobilizing in hallway to maintain activity tolerance and strength. No further skilled PT needs identified acutely. No PT follow up recommended at this time.    ?   ? ?Recommendations for follow up therapy are one component of a multi-disciplinary discharge planning process, led by the attending physician.  Recommendations may be updated based on patient status, additional functional criteria and insurance authorization. ? ?Follow Up Recommendations No PT follow up ? ?  ?Assistance Recommended at Discharge None  ?Patient can return home with the following ?   ? ?  ?Equipment Recommendations None recommended by PT  ?Recommendations for Other Services ?    ?  ?Functional Status Assessment Patient has not had a recent decline in their functional status  ? ?  ?Precautions / Restrictions Precautions ?Precautions: None ?Restrictions ?Weight Bearing Restrictions: No  ? ?  ? ?Mobility ? Bed Mobility ?Overal bed mobility: Independent ?  ?  ?  ?  ?  ?  ?  ?  ? ?Transfers ?Overall transfer level: Independent ?  ?  ?  ?  ?  ?  ?  ?  ?  ?  ? ?Ambulation/Gait ?Ambulation/Gait assistance: Independent ?Gait Distance (Feet): 300 Feet ?Assistive device: None ?Gait Pattern/deviations: WFL(Within Functional Limits) ?  ?Gait velocity interpretation: >4.37 ft/sec, indicative of normal walking speed ?  ?  ? ?Stairs ?  ?  ?  ?  ?  ? ?Wheelchair Mobility ?  ? ?Modified Rankin (Stroke Patients Only) ?  ? ?  ? ?Balance Overall  balance assessment: Independent ?  ?  ?  ?  ?  ?  ?  ?  ?  ?  ?  ?  ?  ?  ?  ?  ?  ?  ?   ? ? ? ?Pertinent Vitals/Pain    ? ? ?Home Living Family/patient expects to be discharged to:: Private residence ?Living Arrangements: Children ?Available Help at Discharge: Family ?Type of Home: Apartment ?Home Access: Level entry ?  ?  ?  ?Home Layout: One level ?  ?   ?  ?Prior Function Prior Level of Function : Independent/Modified Independent;Driving ?  ?  ?  ?  ?  ?  ?  ?  ?  ? ? ?Hand Dominance  ?   ? ?  ?Extremity/Trunk Assessment  ? Upper Extremity Assessment ?Upper Extremity Assessment: Overall WFL for tasks assessed ?  ? ?Lower Extremity Assessment ?Lower Extremity Assessment: Overall WFL for tasks assessed ?  ? ?Cervical / Trunk Assessment ?Cervical / Trunk Assessment: Normal  ?Communication  ? Communication: No difficulties  ?Cognition Arousal/Alertness: Awake/alert ?Behavior During Therapy: [facility] for tasks assessed/performed ?Overall Cognitive Status: Within Functional Limits for tasks assessed ?  ?  ?  ?  ?  ?  ?  ?  ?  ?  ?  ?  ?  ?  ?  ?  ?  ?  ?  ? ?  ?General Comments   ? ?  ?Exercises    ? ?Assessment/Plan  ?  ?  PT Assessment Patient does not need any further PT services  ?PT Problem List   ? ?   ?  ?PT Treatment Interventions     ? ?PT Goals (Current goals can be found in the Care Plan section)  ?Acute Rehab PT Goals ?Patient Stated Goal: to go home ?PT Goal Formulation: All assessment and education complete, DC therapy ? ?  ?Frequency   ?  ? ? ?Co-evaluation   ?  ?  ?  ?  ? ? ?  ?AM-PAC PT "6 Clicks" Mobility  ?Outcome Measure Help needed turning from your back to your side while in a flat bed without using bedrails?: None ?Help needed moving from lying on your back to sitting on the side of a flat bed without using bedrails?: None ?Help needed moving to and from a bed to a chair (including a wheelchair)?: None ?Help needed standing up from a chair using your arms (e.g., wheelchair or bedside chair)?:  None ?Help needed to walk in hospital room?: None ?Help needed climbing 3-5 steps with a railing? : None ?6 Click Score: 24 ? ?  ?End of Session   ?Activity Tolerance: Patient tolerated treatment well ?Patient left: in bed ?Nurse Communication: Mobility status ?PT Visit Diagnosis: Muscle weakness (generalized) (M62.81) ?  ? ?Time: 4650-3546 ?PT Time Calculation (min) (ACUTE ONLY): 12 min ? ? ?Charges:   PT Evaluation ?$PT Eval Low Complexity: 1 Low ?  ?  ?   ? ? ?Nixie Laube A. Dan Humphreys, PT, DPT ?Acute Rehabilitation Services ?Pager [phone] ?Office [phone] ? ? ?Mihika Surrette A Preslyn Warr ?02/13/2022, 2:23 PM ? ?

## 2022-02-13 NOTE — TOC Progression Note (Signed)
Transition of Care (TOC) - Progression Note  ? ? ?Patient Details  ?Name: Danny Lowe ?MRN: [id number] ?Date of Birth: [birth year] ? ?Transition of Care (TOC) CM/SW Contact  ?Tom-Johnson, Hershal Coria, RN ?Phone Number: ?02/13/2022, 4:20 PM ? ?Clinical Narrative:    ? ?Received a call from Otho Ket, ( [phone] ex [phone]) SW at [facility] and she Verifies patient is active with [facility] and requests all prescriptions faxed to [phone]. Patient notified and states he had transferred to [facility] due to the distance. CM tried to reach Morrell Riddle but left a secure message to return call. CM will continue to follow with needs. ? ?  ?  ? ?Expected Discharge Plan and Services ?  ?  ?  ?  ?  ?                ?  ?  ?  ?  ?  ?  ?  ?  ?  ?  ? ? ?Social Determinants of Health (SDOH) Interventions ?  ? ?Readmission Risk Interventions ?No flowsheet data found. ? ?

## 2022-02-13 NOTE — Progress Notes (Signed)
? Danny Lowe  HXT:[id number] DOB: 1970/12/08 DOA: 02/11/2022 ?PCP: Lois Huxley, PA   ? ?Brief Narrative:  ?51 year old with a history of PE/DVT, intracranial hemorrhage following a fall, and a seizure disorder related to [place] who presented to the ER with 3 days of generalized malaise with severe headaches, upper respiratory symptoms, diarrhea, and fever. ? ?Consultants:  ?None ? ?Code Status: FULL CODE ? ?DVT prophylaxis: ?Eliquis ? ?Interim Hx: ?Tmax 101.3 since last night.  Vital signs otherwise stable.  Saturation 96% room air.  Renal function improving.  Mild hypokalemia noted.  Feeling significantly better today.  Still feels weak in general.  Appetite improving.  No significant shortness of breath. ? ?Assessment & Plan: ? ?Bilateral multilobar pneumonia with sepsis ?Agree that presenting symptoms most consistent with a viral etiology but with recurring fever and progression of symptoms I reinitiated antibiotic empirically -continue volume resuscitation -met sepsis criteria with fever, leukocytosis, and suspected bacterial pulmonary infection -hemodynamically improving -responding to empiric antibiotic and volume resuscitation ? ?Hyponatremia ?Due to poor intake/hypovolemia -continue volume resuscitation ? ?Severe hypomagnesemia ?Due to poor intake and GI losses -corrected with supplementation ? ?Mild hypokalemia ?Due to poor intake and GI loss -supplement and follow-up in a.m. ? ?Acute kidney injury ?Due to insensible volume loss as well as large-scale volume loss related to diarrhea leading to prerenal state -continue to hydrate -improving ? ?History of intracranial hemorrhage ?No lasting neurologic deficits -has had follow-up MRI brain which revealed resolution of bleed ? ?History of DVT with PE ?IVC filter in place with recent attempt at filter retrieval which was unsuccessful -continue Eliquis ? ?Seizure ?Felt to be acutely related to [place] -self stopped his AEDs November 2022 -no seizure since that  time ? ? ?Family Communication: No family present at time of exam ?Disposition: From home -anticipate ability to return home likely in approximately 48 hours ? ?Objective: ?Blood pressure 116/71, pulse 80, temperature (!) 101.3 ?F (38.5 ?C), temperature source Oral, resp. rate 20, SpO2 96 %. ? ?Intake/Output Summary (Last 24 hours) at 02/13/2022 0759 ?Last data filed at 02/13/2022 0600 ?Gross per 24 hour  ?Intake 2710.32 ml  ?Output --  ?Net 2710.32 ml  ? ?There were no vitals filed for this visit. ? ?Examination: ?General: No acute respiratory distress ?Lungs: Fine bibasilar crackles with no wheeze ?Cardiovascular: Regular rate and rhythm without murmur gallop or rub normal S1 and S2 ?Abdomen: Nontender, nondistended, soft, bowel sounds positive, no rebound, no ascites, no appreciable mass ?Extremities: No cyanosis or edema bilateral lower extremities ? ? ?CBC: ?Recent Labs  ?Lab 02/11/22 ?1832 02/12/22 ?1655 02/13/22 ?0315  ?WBC 11.2* 6.5 4.4  ?NEUTROABS 9.8* 5.6  --   ?HGB 18.3* 15.6 14.8  ?HCT 54.7* 44.3 43.3  ?MCV 93.0 91.2 90.2  ?PLT 122* 99* 96*  ? ? ?Basic Metabolic Panel: ?Recent Labs  ?Lab 02/11/22 ?1832 02/12/22 ?3748 02/13/22 ?0315  ?NA 129* 131* 131*  ?K 3.9 3.5 3.4*  ?CL 92* 97* 97*  ?CO2 _0 ?GLUCOSE 130* 111* 121*  ?BUN _1 ?CREATININE 1.89* 1.51* 1.19  ?CALCIUM 8.9 7.6* 7.7*  ?MG  --  1.1* 1.9  ? ? ?GFR: ?Estimated Creatinine Clearance: 86.9 mL/min (by C-G formula based on SCr of 1.19 mg/dL). ? ?Liver Function Tests: ?Recent Labs  ?Lab 02/11/22 ?1832 02/12/22 ?2707 02/13/22 ?0315  ?AST 89* 62* 74*  ?ALT 53* 39 38  ?ALKPHOS 40 31* 28*  ?BILITOT 1.3* 0.9 0.6  ?PROT 8.1 5.9* 5.9*  ?ALBUMIN 3.6  2.6* 2.6*  ? ? ? ?Coagulation Profile: ?Recent Labs  ?Lab 02/11/22 ?1832  ?INR 1.5*  ? ? ? ?HbA1C: ?Hgb A1c MFr Bld  ?Date/Time Value Ref Range Status  ?02/16/2021 01:38 AM 4.8 4.8 - 5.6 % Final  ?  Comment:  ?  (NOTE) ?Pre diabetes:          5.7%-6.4% ? ?Diabetes:              >6.4% ? ?Glycemic control  for   <7.0% ?adults with diabetes ?  ? ? ?Scheduled Meds: ? apixaban  5 mg Oral BID  ? ?Continuous Infusions: ? azithromycin 500 mg (02/12/22 2339)  ? cefTRIAXone (ROCEPHIN)  IV 2 g (02/12/22 2117)  ? ? ? LOS: 1 day  ? ?Cherene Altes, MD ?[facility] ?Office  [phone] ?Pager - Text Page per Shea Evans ? ?If 7PM-7AM, please contact night-coverage per Amion ?02/13/2022, 7:59 AM ? ? ? ? ?

## 2022-02-14 LAB — BASIC METABOLIC PANEL
Anion gap: 11 (ref 5–15)
BUN: 9 mg/dL (ref 6–20)
CO2: 23 mmol/L (ref 22–32)
Calcium: 8.7 mg/dL — ABNORMAL LOW (ref 8.9–10.3)
Chloride: 101 mmol/L (ref 98–111)
Creatinine, Ser: 1.15 mg/dL (ref 0.61–1.24)
GFR, Estimated: >60 mL/min (ref >60)
Glucose, Bld: 93 mg/dL (ref 70–99)
Potassium: 3.8 mmol/L (ref 3.5–5.1)
Sodium: 135 mmol/L (ref 135–145)

## 2022-02-14 LAB — CBC
HCT: 48.5 % (ref 39.0–52.0)
Hemoglobin: 16.9 g/dL (ref 13.0–17.0)
MCH: 31.6 pg (ref 26.0–34.0)
MCHC: 34.8 g/dL (ref 30.0–36.0)
MCV: 90.8 fL (ref 80.0–100.0)
Platelets: 123 10*3/uL — ABNORMAL LOW (ref 150–400)
RBC: 5.34 MIL/uL (ref 4.22–5.81)
RDW: 14.2 % (ref 11.5–15.5)
WBC: 4.8 10*3/uL (ref 4.0–10.5)
nRBC: 0 % (ref 0.0–0.2)

## 2022-02-14 LAB — PROCALCITONIN: Procalcitonin: 2.49 ng/mL

## 2022-02-14 LAB — MAGNESIUM: Magnesium: 2 mg/dL (ref 1.7–2.4)

## 2022-02-14 MED ORDER — AZITHROMYCIN 500 MG PO TABS: 250.0000 mg | ORAL_TABLET | Freq: Every day | ORAL | Status: DC

## 2022-02-14 MED ORDER — CEFDINIR 300 MG PO CAPS
300.0000 mg | ORAL_CAPSULE | Freq: Two times a day (BID) | ORAL | Status: DC
  Filled 2022-02-14: qty 1

## 2022-02-14 MED ORDER — AZITHROMYCIN 250 MG PO TABS
250.0000 mg | ORAL_TABLET | Freq: Every day | ORAL | 0 refills | Status: AC
Start: 2022-02-14 — End: 2022-02-16

## 2022-02-14 MED ORDER — CEFDINIR 300 MG PO CAPS
300.0000 mg | ORAL_CAPSULE | Freq: Two times a day (BID) | ORAL | 0 refills | Status: AC
Start: 2022-02-14 — End: ?

## 2022-02-14 MED ORDER — DICLOFENAC SODIUM 1 % EX GEL
2.0000 g | Freq: Four times a day (QID) | CUTANEOUS | Status: DC | PRN
  Filled 2022-02-14: qty 100

## 2022-02-14 NOTE — Discharge Summary (Signed)
?DISCHARGE SUMMARY ? ?Danny Lowe ? ?MR#: [id number] ? ?DOB:12/11/1970  ?Date of Admission: 02/11/2022 ?Date of Discharge: 02/14/2022 ? ?Attending Physician:Danny Loa Hennie Duos, MD ? ?Patient's TLX:BWIOMB, Danny Bale, PA ? ?Consults: none  ? ?Disposition: D/C home  ? ? ?Follow-up Appts: ? Follow-up Information   ? ? Danny Huxley, PA Follow up.   ?Specialty: Family Medicine ?Why: Follow up in 7-10 days for a routine check. ?Contact information: ?[address] ?[address] ?[place] [address] ?[phone] ? ? ?  ?  ? ?  ?  ? ?  ? ? ?Tests Needing Follow-up: ?-f/u CXR suggested in 4-6 weeks to assure B infiltrates have fully resolved  ? ?Discharge Diagnoses: ?Bilateral multilobar atypical pneumonia with sepsis ?Hyponatremia ?Severe hypomagnesemia ?Mild hypokalemia ?Acute kidney injury ?History of intracranial hemorrhage ?History of DVT with PE ?Seizure D/O   ? ?Initial presentation: ?51 year old with a history of PE/DVT, intracranial hemorrhage following a fall, and a seizure disorder related to Danny Lowe who presented to the ER with 3 days of generalized malaise with severe headaches, upper respiratory symptoms, diarrhea, and fever. ? ?Hospital Course: ? ?Bilateral multilobar pneumonia with sepsis ?Agree that presenting symptoms most consistent with a viral etiology but with recurring fever and progression of symptoms I reinitiated antibiotic empirically - completed volume resuscitation - met sepsis criteria with fever, leukocytosis, and suspected bacterial pulmonary infection - hemodynamically improving - responded to empiric antibiotic and volume resuscitation - much improved at time of d/c - to complete 7 full days of tx w/ transition to oral abx at time of d/c  ?  ?Hyponatremia ?Due to poor intake/hypovolemia - corrected w/ volume resuscitation ?  ?Severe hypomagnesemia ?Due to poor intake and GI losses - corrected with supplementation ?  ?Mild hypokalemia ?Due to poor intake and GI loss - corrected w/ supplementation  ?   ?Acute kidney injury ?Due to insensible volume loss as well as large-scale volume loss related to diarrhea leading to prerenal state - hydrated w/ IVF - resolved  ?  ?History of intracranial hemorrhage ?No lasting neurologic deficits -has had follow-up MRI brain which revealed resolution of bleed ?  ?History of DVT with PE ?IVC filter in place with recent attempt at filter retrieval which was unsuccessful - continue Eliquis ?  ?Seizure D/O  ?Felt to be acutely related to [place] -self stopped his AEDs November 2022 -no seizure since that time ?  ?  ?Allergies as of 02/14/2022   ? ?   Reactions  ? Keppra [levetiracetam] Rash  ? hallucinations  ? ?  ? ?  ?Medication List  ?  ? ?TAKE these medications   ? ?acetaminophen 500 MG tablet ?Commonly known as: TYLENOL ?Take 500 mg by mouth every 6 (six) hours as needed for mild pain, fever or headache. ?  ?azithromycin 250 MG tablet ?Commonly known as: ZITHROMAX ?Take 1 tablet (250 mg total) by mouth daily for 2 days. ?  ?cefdinir 300 MG capsule ?Commonly known as: OMNICEF ?Take 1 capsule (300 mg total) by mouth every 12 (twelve) hours. ?  ?Eliquis 5 MG Tabs tablet ?Generic drug: apixaban ?Take 5 mg by mouth 2 (two) times daily. ?  ?ibuprofen 200 MG tablet ?Commonly known as: ADVIL ?Take 400 mg by mouth every 6 (six) hours as needed for fever or headache. ?  ?traZODone 100 MG tablet ?Commonly known as: DESYREL ?Take 100 mg by mouth at bedtime as needed for sleep. ?  ? ?  ? ? ?Day of Discharge ?BP 112/74  Pulse 83   Temp 98.6 ?F (37 ?C) (Oral)   Resp 18   SpO2 96%  ? ?Physical Exam: ?General: No acute respiratory distress ?Lungs: faint diffuse crackles - no wheezing  ?Cardiovascular: Regular rate and rhythm without murmur gallop or rub normal S1 and S2 ?Abdomen: Nontender, nondistended, soft, bowel sounds positive, no rebound, no ascites, no appreciable mass ?Extremities: No significant cyanosis, clubbing, or edema bilateral lower extremities ? ?Basic Metabolic Panel: ?Recent  Labs  ?Lab 02/11/22 ?1832 02/12/22 ?4709 02/13/22 ?0315 02/14/22 ?6283  ?NA 129* 131* 131* 135  ?K 3.9 3.5 3.4* 3.8  ?CL 92* 97* 97* 101  ?CO2 '24 23 23 23  ' ?GLUCOSE 130* 111* 121* 93  ?BUN '11 10 9 9  ' ?CREATININE 1.89* 1.51* 1.19 1.15  ?CALCIUM 8.9 7.6* 7.7* 8.7*  ?MG  --  1.1* 1.9 2.0  ? ? ?Liver Function Tests: ?Recent Labs  ?Lab 02/11/22 ?1832 02/12/22 ?6629 02/13/22 ?0315  ?AST 89* 62* 74*  ?ALT 53* 39 38  ?ALKPHOS 40 31* 28*  ?BILITOT 1.3* 0.9 0.6  ?PROT 8.1 5.9* 5.9*  ?ALBUMIN 3.6 2.6* 2.6*  ? ? ?CBC: ?Recent Labs  ?Lab 02/11/22 ?1832 02/12/22 ?4765 02/13/22 ?0315 02/14/22 ?4650  ?WBC 11.2* 6.5 4.4 4.8  ?NEUTROABS 9.8* 5.6  --   --   ?HGB 18.3* 15.6 14.8 16.9  ?HCT 54.7* 44.3 43.3 48.5  ?MCV 93.0 91.2 90.2 90.8  ?PLT 122* 99* 96* 123*  ? ? ?Recent Results (from the past 240 hour(s))  ?Resp Panel by RT-PCR (Flu A&B, Covid) Nasopharyngeal Swab     Status: None  ? Collection Time: 02/10/22  3:25 PM  ? Specimen: Nasopharyngeal Swab; Nasopharyngeal(NP) swabs in vial transport medium  ?Result Value Ref Range Status  ? SARS Coronavirus 2 by RT PCR NEGATIVE NEGATIVE Final  ?  Comment: (NOTE) ?SARS-CoV-2 target nucleic acids are NOT DETECTED. ? ?The SARS-CoV-2 RNA is generally detectable in upper respiratory ?specimens during the acute phase of infection. The lowest ?concentration of SARS-CoV-2 viral copies this assay can detect is ?138 copies/mL. A negative result does not preclude SARS-Cov-2 ?infection and should not be used as the sole basis for treatment or ?other patient management decisions. A negative result may occur with  ?improper specimen collection/handling, submission of specimen other ?than nasopharyngeal swab, presence of viral mutation(s) within the ?areas targeted by this assay, and inadequate number of viral ?copies(<138 copies/mL). A negative result must be combined with ?clinical observations, patient history, and epidemiological ?information. The expected result is Negative. ? ?Fact Sheet for  Patients:  ?EntrepreneurPulse.com.au ? ?Fact Sheet for Healthcare Providers:  ?IncredibleEmployment.be ? ?This test is no t yet approved or cleared by the [place] FDA and  ?has been authorized for detection and/or diagnosis of SARS-CoV-2 by ?FDA under an Emergency Use Authorization (EUA). This EUA will remain  ?in effect (meaning this test can be used) for the duration of the ?COVID-19 declaration under Section 564(b)(1) of the Act, 21 ?U.S.C.section 360bbb-3(b)(1), unless the authorization is terminated  ?or revoked sooner.  ? ? ?  ? Influenza A by PCR NEGATIVE NEGATIVE Final  ? Influenza B by PCR NEGATIVE NEGATIVE Final  ?  Comment: (NOTE) ?The Xpert Xpress SARS-CoV-2/FLU/RSV plus assay is intended as an aid ?in the diagnosis of influenza from Nasopharyngeal swab specimens and ?should not be used as a sole basis for treatment. Nasal washings and ?aspirates are unacceptable for Xpert Xpress SARS-CoV-2/FLU/RSV ?testing. ? ?Fact Sheet for Patients: ?EntrepreneurPulse.com.au ? ?Fact Sheet for Healthcare Providers: ?  IncredibleEmployment.be ? ?This test is not yet approved or cleared by the [place] FDA and ?has been authorized for detection and/or diagnosis of SARS-CoV-2 by ?FDA under an Emergency Use Authorization (EUA). This EUA will remain ?in effect (meaning this test can be used) for the duration of the ?COVID-19 declaration under Section 564(b)(1) of the Act, 21 U.S.C. ?section 360bbb-3(b)(1), unless the authorization is terminated or ?revoked. ? ?Performed at [facility], [address] ?[place], [place] [address] ?  ?Resp Panel by RT-PCR (Flu A&B, Covid)     Status: None  ? Collection Time: 02/11/22  5:36 PM  ? Specimen: Nasopharyngeal(NP) swabs in vial transport medium  ?Result Value Ref Range Status  ? SARS Coronavirus 2 by RT PCR NEGATIVE NEGATIVE Final  ?  Comment: (NOTE) ?SARS-CoV-2 target nucleic acids  are NOT DETECTED. ? ?The SARS-CoV-2 RNA is generally detectable in upper respiratory ?specimens during the acute phase of infection. The lowest ?concentration of SARS-CoV-2 viral copies this assay can detect is

## 2022-02-14 NOTE — Progress Notes (Signed)
Patient reports bright red blood moderate amount upon wiping after straining to have a bowel movement today.  ?

## 2022-02-14 NOTE — Progress Notes (Signed)
DISCHARGE NOTE HOME ?Robin Searing to be discharged Home per MD order. Discussed prescriptions and follow up appointments with the patient. Prescriptions given to patient; medication list explained in detail. Patient verbalized understanding. ? ?Skin clean, dry and intact without evidence of skin break down, no evidence of skin tears noted. IV catheter discontinued intact. Site without signs and symptoms of complications. Dressing and pressure applied. Pt denies pain at the site currently. No complaints noted. ? ?Patient free of lines, drains, and wounds.  ? ?An After Visit Summary (AVS) was printed and given to the patient. ?Patient escorted via wheelchair, and discharged home via private auto. ? ?Myrtis Hopping, RN  ?

## 2022-02-14 NOTE — Plan of Care (Signed)

## 2022-02-16 LAB — CULTURE, BLOOD (ROUTINE X 2)
Culture: NO GROWTH
Special Requests: ADEQUATE

## 2022-02-17 LAB — CULTURE, BLOOD (ROUTINE X 2)
Culture: NO GROWTH
Special Requests: ADEQUATE

## 2022-03-26 ENCOUNTER — Ambulatory Visit: Payer: Self-pay | Admitting: Neurology

## 2022-04-07 IMAGING — US US EXTREM LOW VENOUS
1 series · 13 of 24 positions shown · non-contrast
Comparison: None.

CLINICAL DATA: History of DVT, intraparenchymal hemorrhage, IVC
filter placement

EXAM:
BILATERAL LOWER EXTREMITY VENOUS DOPPLER ULTRASOUND
TECHNIQUE: Gray-scale sonography with compression, as well as color and duplex
ultrasound, were performed to evaluate the deep venous system(s)
from the level of the common femoral vein through the popliteal and
proximal calf veins.

[Series 1: us extrem low venous · 0.08mm/px · 13 of 67 slices shown]
[im 1/67]
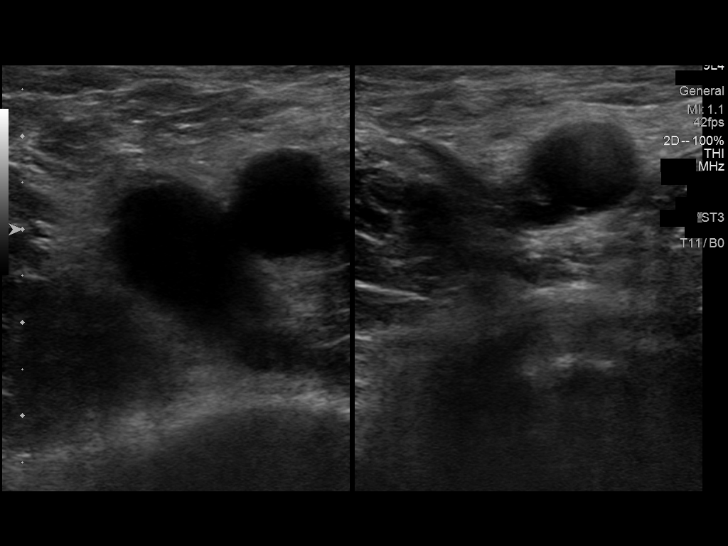
[im 6/67]
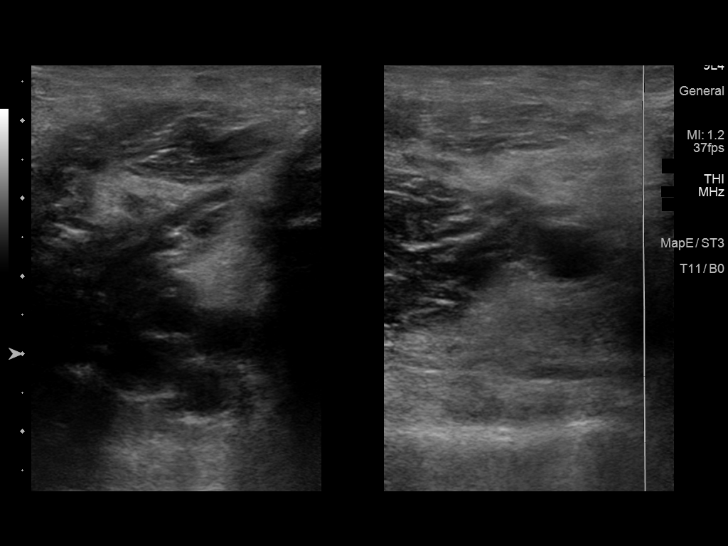
[im 12/67]
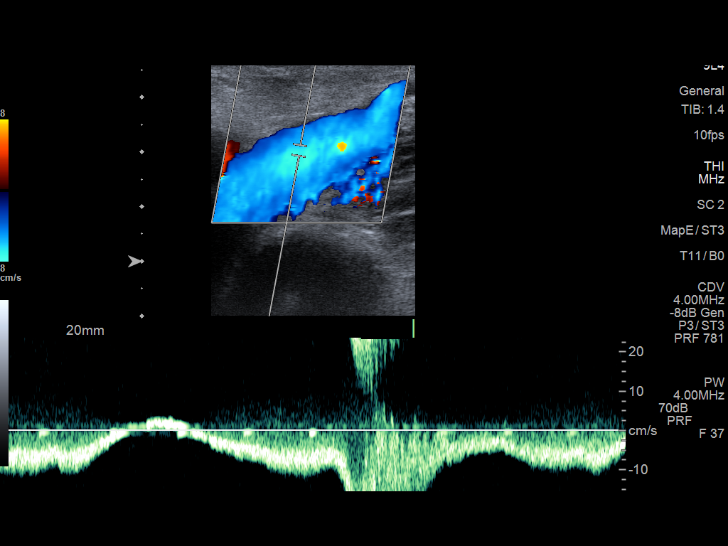
[im 18/67]
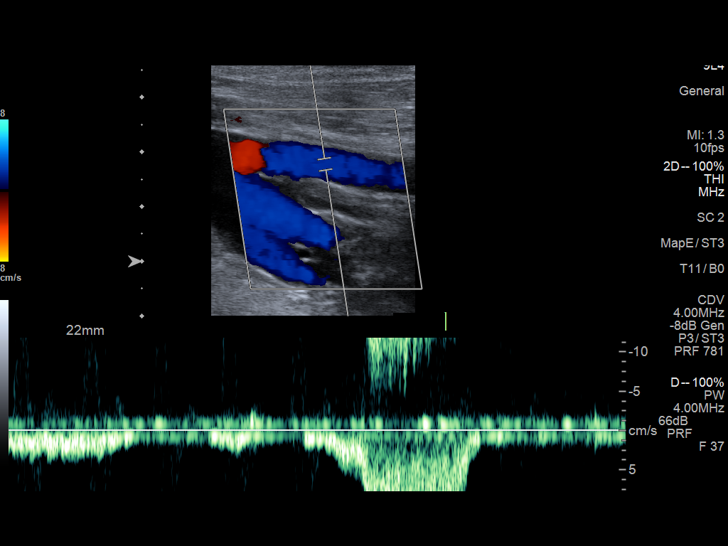
[im 23/67]
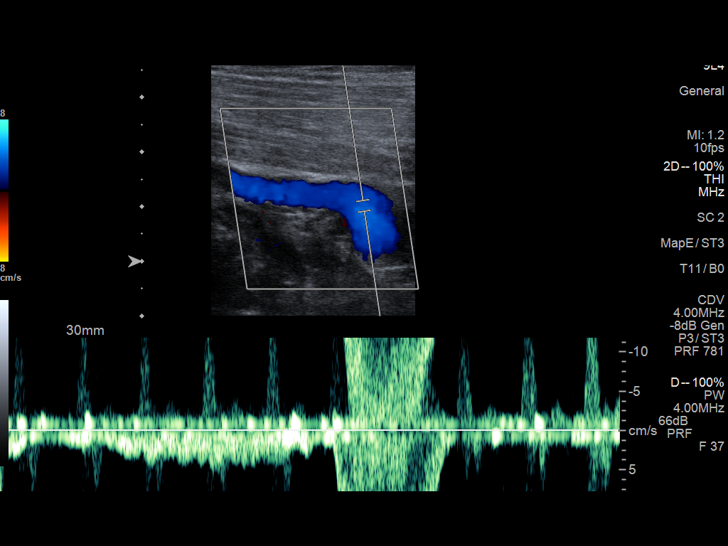
[im 29/67]
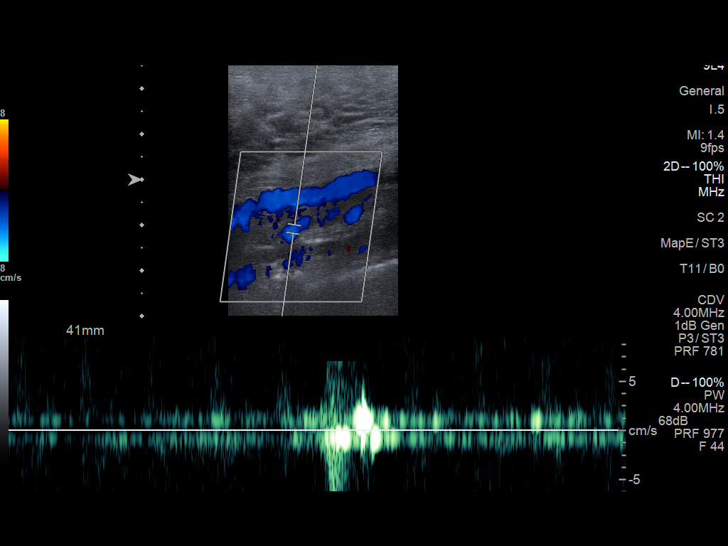
[im 35/67]
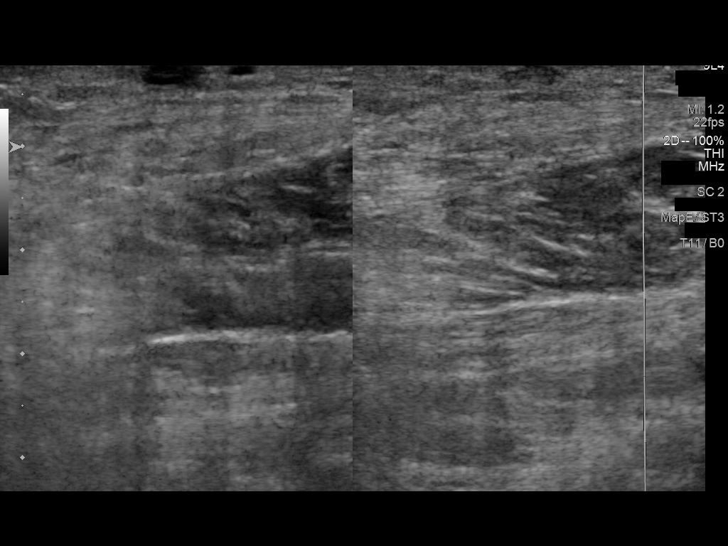
[im 38/67]
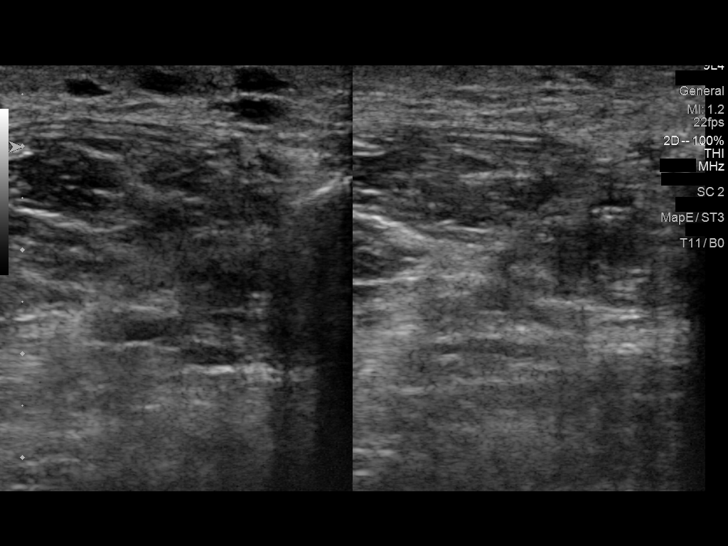
[im 44/67]
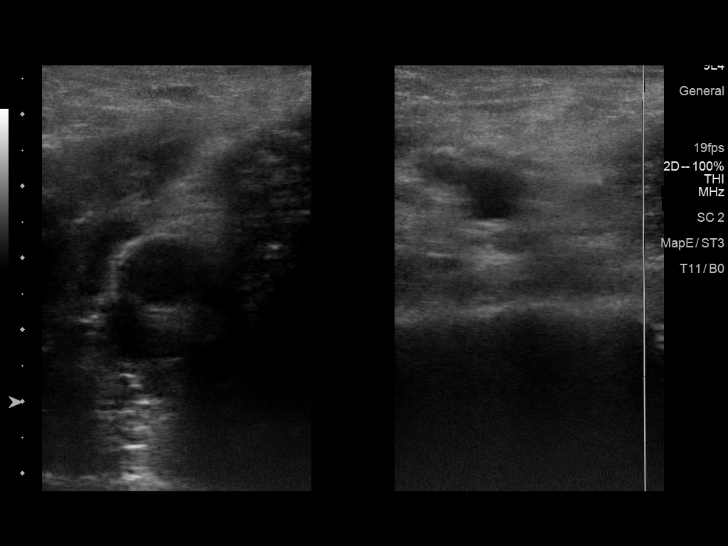
[im 49/67]
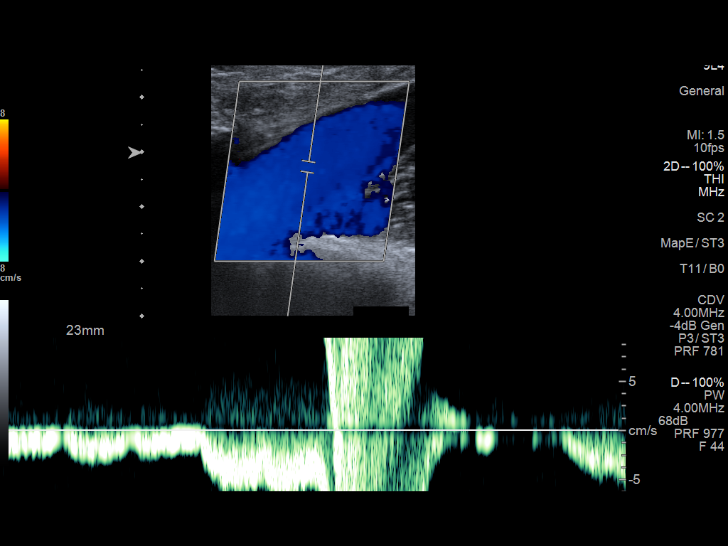
[im 55/67]
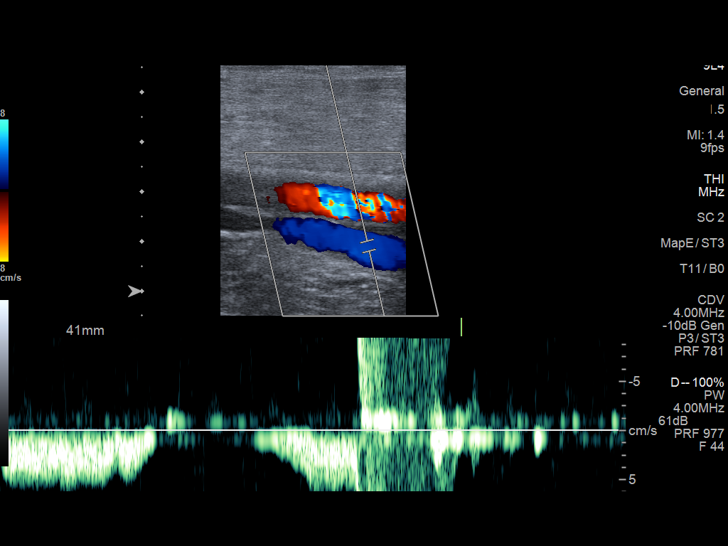
[im 61/67]
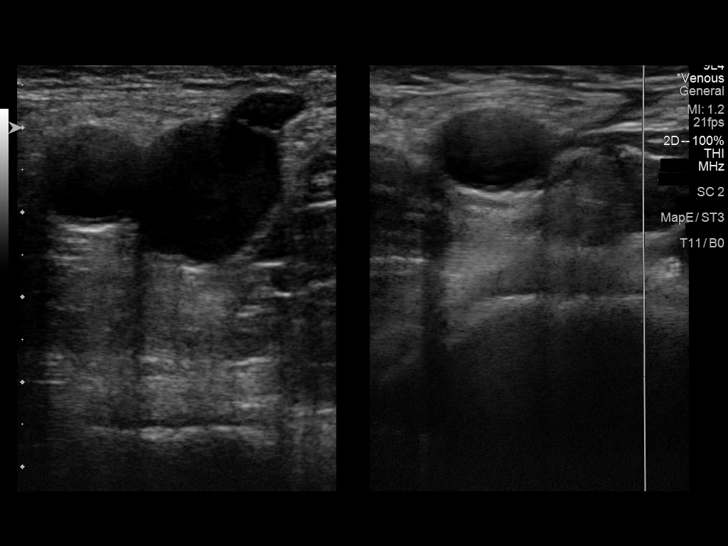
[im 67/67]
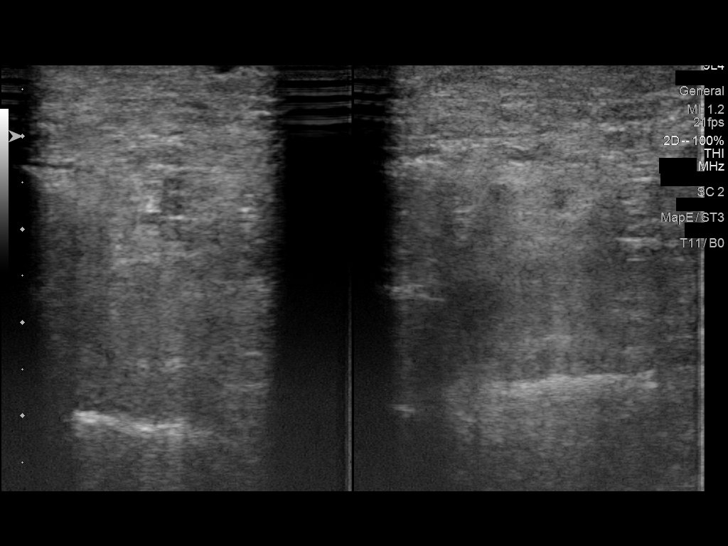

[13 of 24 positions shown; findings below may reference images not displayed]

FINDINGS: VENOUS

On the left, paired femoral veins are again identified. There is
stable short-segment eccentric hypoechoic mural thickening in the
proximal aspect of 1 moiety of the paired femoral veins, resulting
in mild narrowing of the lumen. Continued good flow signal through
this region on color Doppler. Elsewhere normal compressibility of
the common femoral, paired femoral, and popliteal veins, as well as
the visualized calf veins. Visualized portions of profunda femoral
vein and great saphenous vein unremarkable. No filling defects to
suggest DVT on grayscale or color Doppler imaging. Doppler waveforms
show normal direction of venous flow, normal respiratory phasicity
and response to augmentation.

On the right, normal compressibility of the common femoral,
superficial femoral, and popliteal veins, as well as the visualized
calf veins. Visualized portions of profunda femoral vein and great
saphenous vein unremarkable. No filling defects to suggest DVT on
grayscale or color Doppler imaging. Doppler waveforms show normal
direction of venous flow, normal respiratory phasicity and response
to augmentation.

OTHER

None.

Limitations: none
IMPRESSION: 1. No acute or recurrent DVT.
2. Stable non-occlusive short-segment chronic post thrombotic change
in 1 moiety of the paired proximal left femoral veins, without
significant stenosis.

## 2022-05-08 IMAGING — XA IR IVC FILTER RETRIEVAL / S&I /IMG GUID/MOD SED
1 series · 13 of 16 positions shown · non-contrast
Comparison: None.

INDICATION: IVC filter placed secondary to DVT/pulmonary embolism, in the
setting of intracranial hemorrhage a relative contraindication to
anticoagulation. Patient has now recovered, is tolerating
anticoagulation, and filter retrieval is requested.

EXAM:
1. IR IVC FILTER RETRIEVAL/S+I/ IMAGE GUIDE MODERATE SEDATION
(ATTEMPTED)
2. IR ULTRASOUND GUIDANCE VASC ACCESS
TECHNIQUE: Informed written consent was obtained from the patient after a
discussion of the risks, benefits and alternatives to treatment.
Questions regarding the procedure were encouraged and answered. A
timeout was performed prior to the initiation of the procedure.

[Series 1: ir ivc filter retrieval / s&i /img guid/ · 5 acquisitions, 13 frames shown]
[im 1/5]
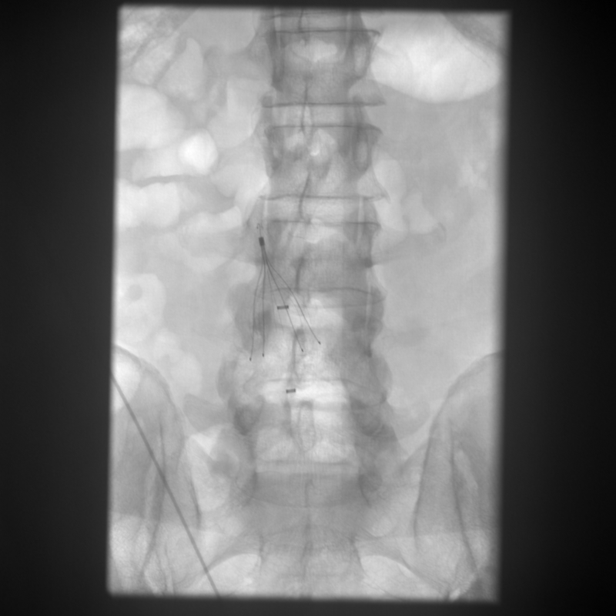
[im 1/5]
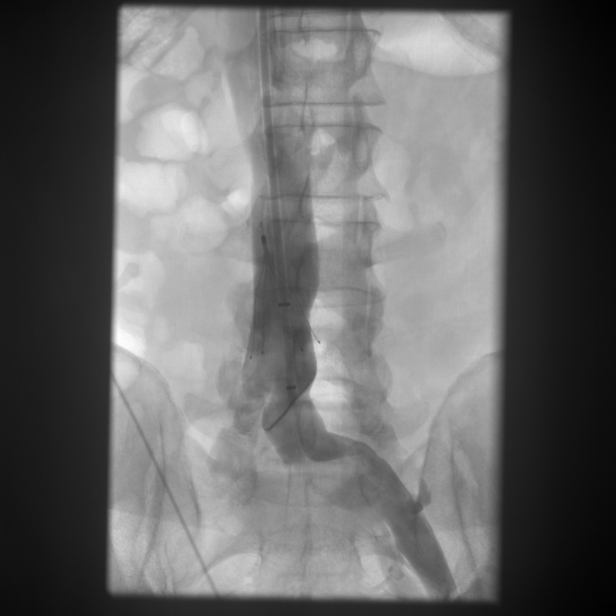
[im 1/5]
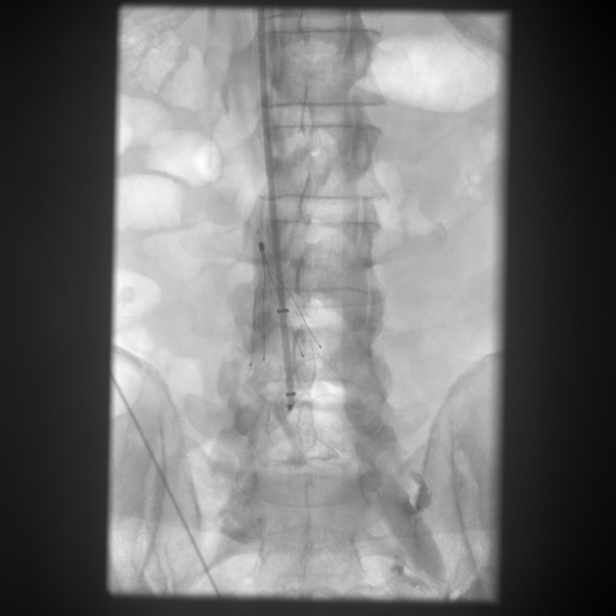
[im 2/5]
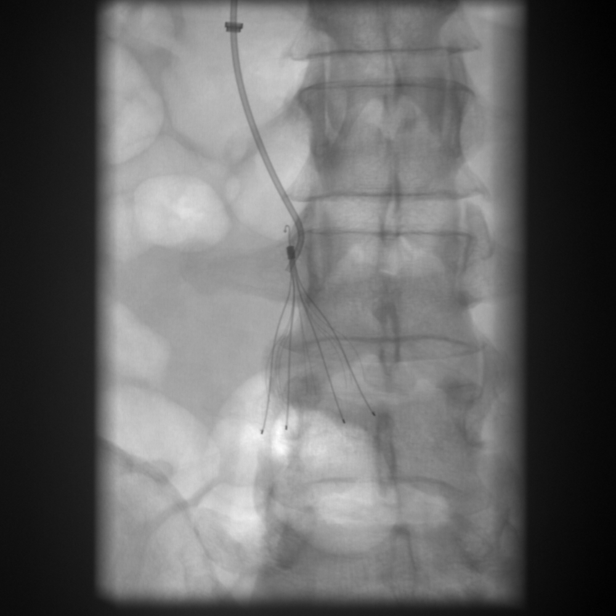
[im 2/5]
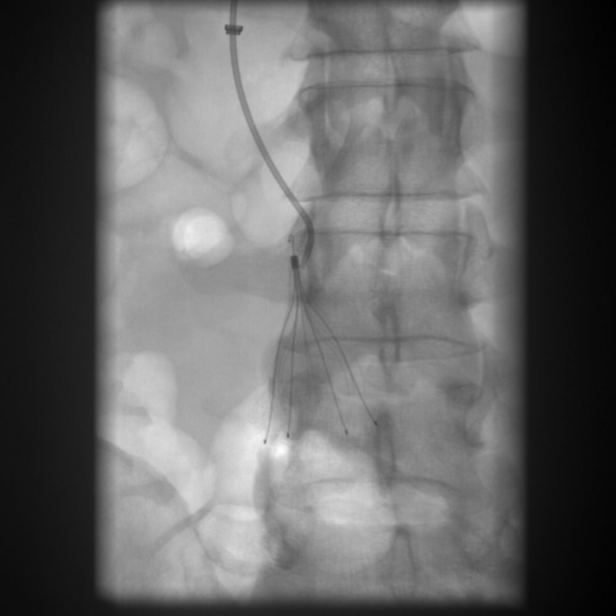
[im 2/5]
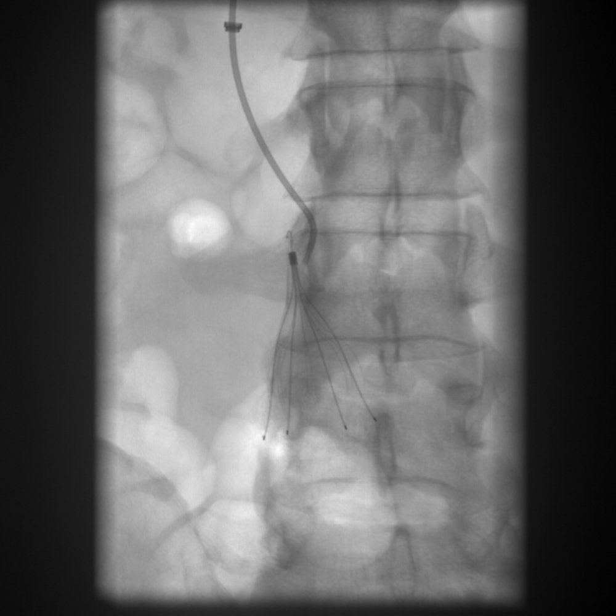
[im 3/5]
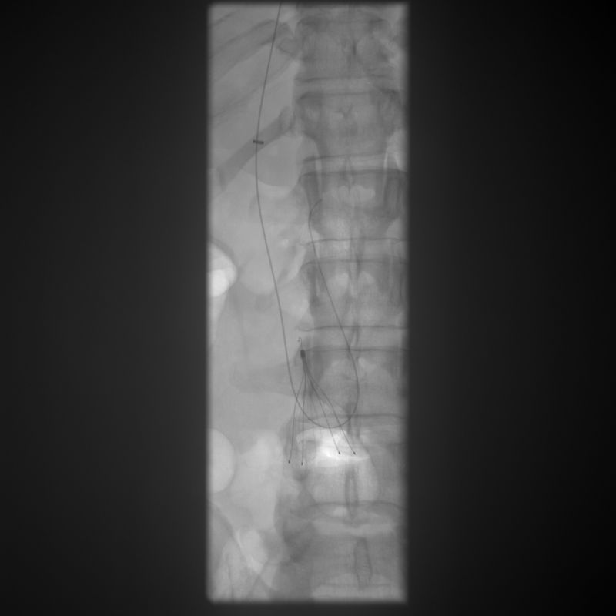
[im 3/5]
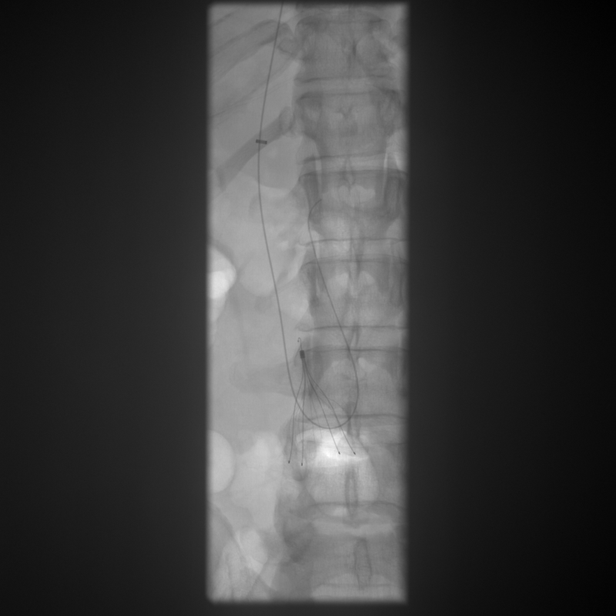
[im 4/5]
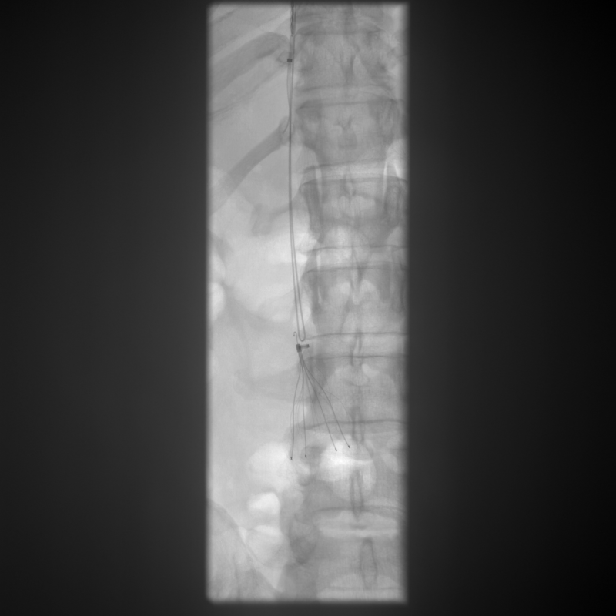
[im 4/5]
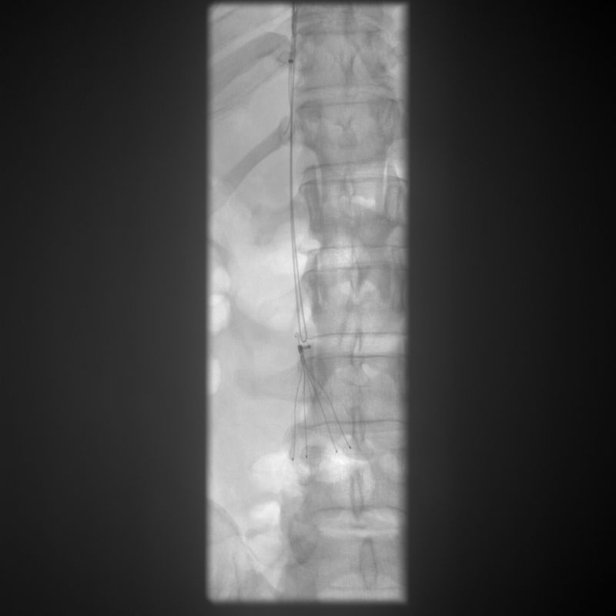
[im 4/5]
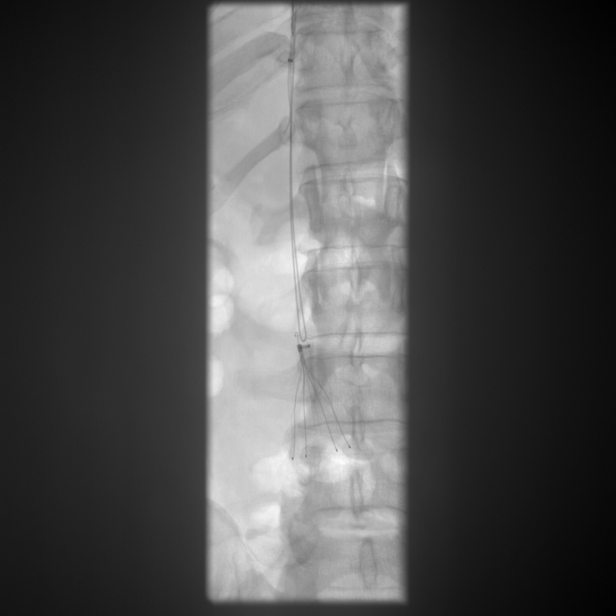
[im 5/5]
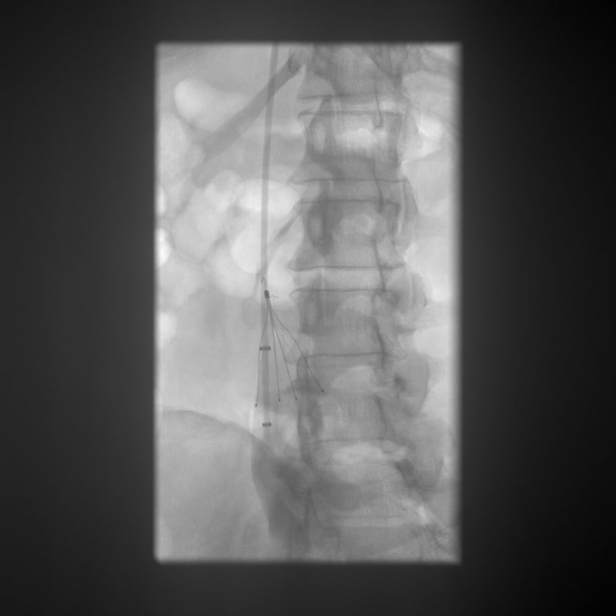
[im 5/5]
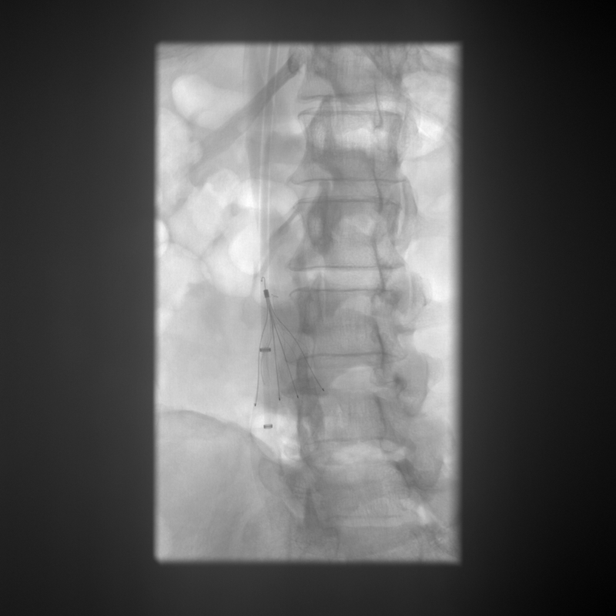

[13 of 16 positions shown; findings below may reference images not displayed]

MEDICATIONS:
No antibiotics were indicated

ANESTHESIA/SEDATION:
Intravenous Fentanyl 400mcg and Versed 7mg were administered as
conscious sedation during continuous monitoring of the patient's
level of consciousness and physiological / cardiorespiratory status
by the radiology RN, with a total moderate sedation time of 78
minutes.

CONTRAST:  75mL OMNIPAQUE IOHEXOL 300 MG/ML  SOLN
The right neck was prepped and draped in the usual sterile fashion,
and a sterile drape was applied covering the operative field.
Maximum barrier sterile technique with sterile gowns and gloves were
used for the procedure. A timeout was performed prior to the
initiation of the procedure. Local anesthesia was provided with 1%
lidocaine.

Under direct ultrasound guidance, the right internal jugular vein
was accessed with a 21 gauge micropuncture set after the overlying
soft tissues were anesthetized with 1% lidocaine. An ultrasound
image was saved for documentation purposes. This allowed for
placement of a 11 French sheath into the IVC below the filter for
cavography. Coaxial snare was advanced and since made to grasp the
filter but the tip appear adjacent to or imbedded in the lateral
wall of the IVC. A 5 French angled catheter was advanced and a 035
glidewire was looped on to the filter, and brought up with the
snare, which with mild traction did reorient the filter but
unfortunately the tip could not be captured with the sheath, kinking
of 1 of the filter struts was identified. Additional attempts were
again made to snare the tip of the filter after repositioning but
these were unsuccessful. Ultimately, the procedure was terminated
with plans for IVC imaging and rescheduling for complex filter
retrieval in the near future.

All wires, catheters and sheaths were removed from the patient.
Hemostasis was achieved at the right neck access site with manual
compression. A dressing was placed. The patient tolerated the above
procedure well without immediate postprocedural complication.
FINDINGS: Inferior venacavagram demonstrates wide patency of the IVC. There is
no thrombus within the infrarenal IVC filter which appears unchanged
in position from the time of initial placement. Caval wall
penetration by 1 leg medially is identified.

The IVC filter could not be retrieved using the standard snare
device nor with Glidewire snare technique.
IMPRESSION: 1. Tilt of the IVC filter, tip directed towards the lateral wall of
the IVC, and medial caval wall penetration by 1 leg.

2. Unsuccessful fluoroscopic guided removal of infrarenal IVC filter
using standard techniques.

PLAN:
CTA venogram abdomen/pelvis to delineate filter and IVC anatomy,
with subsequent complex filter retrieval under fluoroscopy scheduled
electively at the patient's convenience.

## 2022-07-29 ENCOUNTER — Other Ambulatory Visit (HOSPITAL_COMMUNITY): Payer: Self-pay | Admitting: Interventional Radiology

## 2022-07-29 DIAGNOSIS — Z95828 Presence of other vascular implants and grafts: Secondary | ICD-10-CM

## 2022-07-30 ENCOUNTER — Encounter: Payer: Self-pay | Admitting: *Deleted

## 2022-07-30 ENCOUNTER — Ambulatory Visit
Admission: RE | Admit: 2022-07-30 | Discharge: 2022-07-30 | Disposition: A | Discharge status: Home / Self Care | Payer: Non-veteran care | Source: Ambulatory Visit | Attending: Interventional Radiology | Admitting: Interventional Radiology

## 2022-07-30 DIAGNOSIS — Z95828 Presence of other vascular implants and grafts: Secondary | ICD-10-CM

## 2022-07-30 HISTORY — PX: IR RADIOLOGIST EVAL & MGMT: IMG5224

## 2022-07-30 NOTE — Progress Notes (Signed)
Patient ID: Danny Lowe, male   DOB: February 07, 1971, 51 y.o.   MRN: [id number]  I returned his phone call today. He wanted to talk about filter retrieval. Apparently he got ill after the previous retrieval attempt, with sepsis. He was around some ill individuals, probably some infectious etiology, doubtful related to attempted filter retrieval which would be unusual. He is doing better now.   We reviewed the findings of the CT which demonstrated the filter   tilt with tip embedded in the right anterolateral wall, and leg perforation through the wall of the cava without regional organ involvement.  There is no evidence of caval thrombus .  He does endorse symptomatology in the region of filter especially when he coughs or bears down, which is atypical. We reviewed briefly the pathophysiology of pulmonary thromboembolic disease.  We reviewed the structure and function of modern IVC filters and the role in the setting of contraindications to or failure of anticoagulation.  We reviewed possible risks and complications of long-term caval filtration including filter fracture, caval wall and adjacent organ perforation, and fragment migration as well as caval thrombosis.  The patient seemed to understand and had   questions answered. My recommendation would be caval retrieval at this point as he is tolerating anticoagulation without complication.    We discussed technique of transjugular inferior vena cavography and complex filter retrieval under moderate sedation as an outpatient.  He  seemed to understand and had   questions answered.  Now he is motivated to proceed.  Accordingly, we can set   up for IVC filter retrieval as an outpatient at his convenience.

## 2022-09-19 ENCOUNTER — Encounter: Payer: Self-pay | Admitting: Emergency Medicine

## 2022-09-19 ENCOUNTER — Emergency Department: Payer: No Typology Code available for payment source

## 2022-09-19 ENCOUNTER — Other Ambulatory Visit: Payer: Self-pay

## 2022-09-19 ENCOUNTER — Emergency Department
Admission: EM | Admit: 2022-09-19 | Discharge: 2022-09-19 | Disposition: A | Discharge status: Home / Self Care | Payer: No Typology Code available for payment source | Attending: Emergency Medicine | Admitting: Emergency Medicine

## 2022-09-19 DIAGNOSIS — Z1152 Encounter for screening for COVID-19: Secondary | ICD-10-CM | POA: Insufficient documentation

## 2022-09-19 DIAGNOSIS — R7401 Elevation of levels of liver transaminase levels: Secondary | ICD-10-CM

## 2022-09-19 DIAGNOSIS — R42 Dizziness and giddiness: Secondary | ICD-10-CM | POA: Diagnosis present

## 2022-09-19 DIAGNOSIS — Z8673 Personal history of transient ischemic attack (TIA), and cerebral infarction without residual deficits: Secondary | ICD-10-CM | POA: Insufficient documentation

## 2022-09-19 LAB — RESP PANEL BY RT-PCR (FLU A&B, COVID) ARPGX2
Influenza A by PCR: NEGATIVE
Influenza B by PCR: NEGATIVE
SARS Coronavirus 2 by RT PCR: NEGATIVE

## 2022-09-19 LAB — CBC WITH DIFFERENTIAL/PLATELET
Abs Immature Granulocytes: 0 10*3/uL (ref 0.00–0.07)
Basophils Absolute: 0 10*3/uL (ref 0.0–0.1)
Basophils Relative: 1 %
Eosinophils Absolute: 0 10*3/uL (ref 0.0–0.5)
Eosinophils Relative: 0 %
HCT: 49.9 % (ref 39.0–52.0)
Hemoglobin: 16.9 g/dL (ref 13.0–17.0)
Immature Granulocytes: 0 %
Lymphocytes Relative: 31 %
Lymphs Abs: 2.1 10*3/uL (ref 0.7–4.0)
MCH: 31.5 pg (ref 26.0–34.0)
MCHC: 33.9 g/dL (ref 30.0–36.0)
MCV: 93.1 fL (ref 80.0–100.0)
Monocytes Absolute: 0.7 10*3/uL (ref 0.1–1.0)
Monocytes Relative: 11 %
Neutro Abs: 3.9 10*3/uL (ref 1.7–7.7)
Neutrophils Relative %: 57 %
Platelets: 169 10*3/uL (ref 150–400)
RBC: 5.36 MIL/uL (ref 4.22–5.81)
RDW: 14.5 % (ref 11.5–15.5)
WBC: 6.7 10*3/uL (ref 4.0–10.5)
nRBC: 0 % (ref 0.0–0.2)

## 2022-09-19 LAB — COMPREHENSIVE METABOLIC PANEL
ALT: 337 U/L — ABNORMAL HIGH (ref 0–44)
AST: 520 U/L — ABNORMAL HIGH (ref 15–41)
Albumin: 3.7 g/dL (ref 3.5–5.0)
Alkaline Phosphatase: 61 U/L (ref 38–126)
Anion gap: 14 (ref 5–15)
BUN: 8 mg/dL (ref 6–20)
CO2: 25 mmol/L (ref 22–32)
Calcium: 8.6 mg/dL — ABNORMAL LOW (ref 8.9–10.3)
Chloride: 95 mmol/L — ABNORMAL LOW (ref 98–111)
Creatinine, Ser: 1.18 mg/dL (ref 0.61–1.24)
GFR, Estimated: >60 mL/min (ref >60)
Glucose, Bld: 71 mg/dL (ref 70–99)
Potassium: 3.9 mmol/L (ref 3.5–5.1)
Sodium: 134 mmol/L — ABNORMAL LOW (ref 135–145)
Total Bilirubin: 2.5 mg/dL — ABNORMAL HIGH (ref 0.3–1.2)
Total Protein: 7.9 g/dL (ref 6.5–8.1)

## 2022-09-19 LAB — TROPONIN I (HIGH SENSITIVITY): Troponin I (High Sensitivity): 15 ng/L (ref <18)

## 2022-09-19 MED ORDER — MECLIZINE HCL 25 MG PO TABS
25.0000 mg | ORAL_TABLET | Freq: Once | ORAL | Status: AC
  Administered 2022-09-19: 25 mg via ORAL
  Filled 2022-09-19: qty 1

## 2022-09-19 MED ORDER — MECLIZINE HCL 25 MG PO TABS: 25.0000 mg | ORAL_TABLET | Freq: Three times a day (TID) | ORAL | 0 refills | Status: AC | PRN

## 2022-09-19 NOTE — Discharge Instructions (Signed)
Your blood work showed that your liver function tests were somewhat elevated which is likely from drinking.  Please follow-up with your primary doctor to have these rechecked.  CAT scan of your brain did not have any acute abnormalities.  I suspect that your dizziness is related to an inner ear problem.  You can take the Antivert as needed for dizziness.  Please return to the emergency department if you develop any double vision numbness or weakness.  You can follow-up with your primary doctor regarding your dizziness.

## 2022-09-19 NOTE — ED Provider Triage Note (Signed)
Emergency Medicine Provider Triage Evaluation Note  Danny Lowe , a 51 y.o. male  was evaluated in triage.  Pt complains of vertigo since Wednesday. Also reports sinus issues and ears feel clogged when he lays down. Reports he feels nauseated and tired. Not vomiting. Not really feeling dizzy now, worse with turning his head left. Also reports stomach pain. No fever. Slight cough. On eliquis  Patient Active Problem List   Diagnosis Date Noted   Viral pneumonia 02/12/2022   Seizure ([place]) 02/12/2022   Long term (current) use of anticoagulants 02/12/2022   History of pulmonary embolism 02/12/2022   History of DVT (deep vein thrombosis) 02/12/2022   Hx of intracranial hemorrhage 02/12/2022   AKI (acute kidney injury) ([place]) 02/12/2022   Hyponatremia 02/12/2022   Sepsis with acute organ dysfunction without septic shock ([place]) 02/12/2022     Review of Systems  Positive: Dizzy, congestion, cough, abd pain Negative: CP/SOB  Physical Exam  Ht 5\' 9"  (1.753 m)   Wt 100.7 kg   BMI 32.78 kg/m  Gen:   Awake, no distress   Resp:  Normal effort  MSK:   Moves extremities without difficulty  Other:    Medical Decision Making  Medically screening exam initiated at 1:41 PM.  Appropriate orders placed.  Arlyn Buerkle was informed that the remainder of the evaluation will be completed by another provider, this initial triage assessment does not replace that evaluation, and the importance of remaining in the ED until their evaluation is complete.     Marquette Old, PA-C 09/19/22 1347

## 2022-09-19 NOTE — ED Provider Notes (Signed)
[facility] Provider Note    Event Date/Time   First MD Initiated Contact with Patient 09/19/22 1617     (approximate)   History   Dizziness   HPI  Danny Lowe is a 51 y.o. male past medical history of PE/DVT, history of intracranial hemorrhage in setting of trauma seizure disorder who presents with dizziness.  Symptoms been going on for about 3 days.  Patient endorses feeling like he is on a merry-go-round spinning-like sensation that comes and goes.  Is worse with head movement.  He also endorses feeling like he is underwater and that he cannot hear properly out of both ears.  Has chronic tinnitus is not new.  Does have some blurred vision comes and goes in both eyes he denies any new numbness tingling weakness.  Denies history of similar.  No nausea vomiting denies abdominal pain diarrhea.  He has no new trauma denies any new headache.     Past Medical History:  Diagnosis Date   Depression    ICH (intracerebral hemorrhage) ([place]) 02/15/2021   Pulmonary embolism ([place]) 01/2021   Stroke [facility])     Patient Active Problem List   Diagnosis Date Noted   Viral pneumonia 02/12/2022   Seizure ([place]) 02/12/2022   Long term (current) use of anticoagulants 02/12/2022   History of pulmonary embolism 02/12/2022   History of DVT (deep vein thrombosis) 02/12/2022   Hx of intracranial hemorrhage 02/12/2022   AKI (acute kidney injury) ([place]) 02/12/2022   Hyponatremia 02/12/2022   Sepsis with acute organ dysfunction without septic shock ([place]) 02/12/2022     Physical Exam  Triage Vital Signs: ED Triage Vitals  Enc Vitals Group     BP 09/19/22 1344 (!) 131/97     Pulse Rate 09/19/22 1344 84     Resp 09/19/22 1344 20     Temp 09/19/22 1344 97.7 F (36.5 C)     Temp Source 09/19/22 1344 Oral     SpO2 09/19/22 1344 97 %     Weight 09/19/22 1328 222 lb 0.1 oz (100.7 kg)     Height 09/19/22 1328 5\' 9"  (1.753 m)     Head Circumference --      Peak Flow --       Pain Score 09/19/22 1328 0     Pain Loc --      Pain Edu? --      Excl. in [place]? --     Most recent vital signs: Vitals:   09/19/22 1344 09/19/22 1621  BP: (!) 131/97 129/80  Pulse: 84 88  Resp: 20 18  Temp: 97.7 F (36.5 C) 98.6 F (37 C)  SpO2: 97% 100%     General: Awake, no distress.  CV:  Good peripheral perfusion.  Resp:  Normal effort.  Abd:  No distention.  Neuro:             Awake, Alert, Oriented x 3  Other:  Aox3, nml speech  PERRL, EOMI, face symmetric, nml tongue movement  + Left beating horizontal nystagmus, no vertical nystagmus 5/5 strength in the BL upper and lower extremities  Sensation grossly intact in the BL upper and lower extremities  Finger-nose-finger intact BL Normal gait, no ataxia    ED Results / Procedures / Treatments  Labs (all labs ordered are listed, but only abnormal results are displayed) Labs Reviewed  COMPREHENSIVE METABOLIC PANEL - Abnormal; Notable for the following components:      Result Value   Sodium 134 (*)  Chloride 95 (*)    Calcium 8.6 (*)    AST 520 (*)    ALT 337 (*)    Total Bilirubin 2.5 (*)    All other components within normal limits  RESP PANEL BY RT-PCR (FLU A&B, COVID) ARPGX2  CBC WITH DIFFERENTIAL/PLATELET  URINALYSIS, ROUTINE W REFLEX MICROSCOPIC  TROPONIN I (HIGH SENSITIVITY)     EKG  EKG interpretation performed by myself: NSR, nml axis, nml intervals, no acute ischemic changes    RADIOLOGY CT head reviewed interpreted by myself shows old area of calcification no acute process   PROCEDURES:  Critical Care performed: No  Procedures   MEDICATIONS ORDERED IN ED: Medications  meclizine (ANTIVERT) tablet 25 mg (25 mg Oral Given 09/19/22 1731)     IMPRESSION / MDM / ASSESSMENT AND PLAN / ED COURSE  I reviewed the triage vital signs and the nursing notes.                              Patient's presentation is most consistent with acute complicated illness / injury requiring  diagnostic workup.  Differential diagnosis includes, but is not limited to, peripheral vertigo secondary to BPPV, vestibular neuritis, labyrinthitis, posterior stroke, posterior TIA  -year-old male with history of intracranial hemorrhage in the setting of a fall on Eliquis, DVT/PE who presents with dizziness x4 days.  Describes as vertigo that comes and goes worse with movement some associated blurry vision and hearing loss.  Denies headache or any trauma.  Patient's vitals are reassuring neurologic exam is notable for left beating horizontal nystagmus but no other concerning nystagmus normal gait no dysmetria and otherwise intact neurologic exam.  CT head was ordered from triage initially was read by radiology as subarachnoid hemorrhage however on review of patient's prior imaging he has had known areas of calcification from his prior intraparenchymal hemorrhage so I discussed with radiology and they note that this is not changed this is not acute today.  Suspect peripheral vertigo based on patient's neurologic exam and symptomatology.  Low suspicion for central vertigo such as posterior stroke given he is asymptomatic at rest.  Labs were also obtained from triage which are notable for transaminitis, elevation of AST and ALT in 201 ratio like [place] due to his chronic alcohol use.  Patient tells me he is drinking about 6-8 beers per night.  Denies abdominal pain he has no tenderness in the right upper quadrant.  Right upper quadrant ultrasound ordered from triage is negative, other than fatty infiltration of the liver.  We will give a dose of meclizine.     Patient feeling improved and asking to be discharged after meclizine.  Recommend he follow-up with his primary care provider for repeat evaluation of LFTs.  We discussed return precautions including signs of central vertigo such as diplopia numbness or weakness.  He is appropriate for discharge.  FINAL CLINICAL IMPRESSION(S) / ED DIAGNOSES   Final  diagnoses:  Vertigo  Transaminitis     Rx / DC Orders   ED Discharge Orders          Ordered    meclizine (ANTIVERT) 25 MG tablet  3 times daily PRN        09/19/22 1813             Note:  This document was prepared using Dragon voice recognition software and may include unintentional dictation errors.   Georga Hacking, MD 09/19/22 1816

## 2022-09-19 NOTE — ED Triage Notes (Signed)
C/O vertigo since Wednesday.  Has history of CVA and PE.  Also c/o blurry vision.  Currently taking eliquis.  AAOx3.  Skin warm and dry. NAD.  Ambulates with easy and steady gait. NAD

## 2022-09-21 ENCOUNTER — Other Ambulatory Visit: Payer: Self-pay

## 2022-09-28 ENCOUNTER — Other Ambulatory Visit: Payer: Self-pay | Admitting: Student

## 2022-09-28 DIAGNOSIS — Z95828 Presence of other vascular implants and grafts: Secondary | ICD-10-CM

## 2022-09-29 ENCOUNTER — Ambulatory Visit (HOSPITAL_COMMUNITY)
Admission: RE | Admit: 2022-09-29 | Discharge: 2022-09-29 | Disposition: A | Discharge status: Home / Self Care | Payer: Non-veteran care | Source: Ambulatory Visit | Attending: Interventional Radiology | Admitting: Interventional Radiology

## 2022-09-29 ENCOUNTER — Encounter (HOSPITAL_COMMUNITY): Payer: Self-pay

## 2022-09-29 DIAGNOSIS — Z7901 Long term (current) use of anticoagulants: Secondary | ICD-10-CM | POA: Insufficient documentation

## 2022-09-29 DIAGNOSIS — Z86718 Personal history of other venous thrombosis and embolism: Secondary | ICD-10-CM | POA: Diagnosis not present

## 2022-09-29 DIAGNOSIS — F32A Depression, unspecified: Secondary | ICD-10-CM | POA: Diagnosis not present

## 2022-09-29 DIAGNOSIS — Z86711 Personal history of pulmonary embolism: Secondary | ICD-10-CM | POA: Insufficient documentation

## 2022-09-29 DIAGNOSIS — Z8673 Personal history of transient ischemic attack (TIA), and cerebral infarction without residual deficits: Secondary | ICD-10-CM | POA: Insufficient documentation

## 2022-09-29 DIAGNOSIS — Z4589 Encounter for adjustment and management of other implanted devices: Secondary | ICD-10-CM | POA: Insufficient documentation

## 2022-09-29 DIAGNOSIS — Z95828 Presence of other vascular implants and grafts: Secondary | ICD-10-CM

## 2022-09-29 HISTORY — DX: Dizziness and giddiness: R42

## 2022-09-29 HISTORY — PX: IR IVC FILTER RETRIEVAL / S&I /IMG GUID/MOD SED: IMG5308

## 2022-09-29 LAB — BASIC METABOLIC PANEL
Anion gap: 9 (ref 5–15)
BUN: 14 mg/dL (ref 6–20)
CO2: 28 mmol/L (ref 22–32)
Calcium: 9 mg/dL (ref 8.9–10.3)
Chloride: 102 mmol/L (ref 98–111)
Creatinine, Ser: 1.22 mg/dL (ref 0.61–1.24)
GFR, Estimated: >60 mL/min (ref >60)
Glucose, Bld: 94 mg/dL (ref 70–99)
Potassium: 3.6 mmol/L (ref 3.5–5.1)
Sodium: 139 mmol/L (ref 135–145)

## 2022-09-29 LAB — CBC WITH DIFFERENTIAL/PLATELET
Abs Immature Granulocytes: 0.02 10*3/uL (ref 0.00–0.07)
Basophils Absolute: 0 10*3/uL (ref 0.0–0.1)
Basophils Relative: 0 %
Eosinophils Absolute: 0 10*3/uL (ref 0.0–0.5)
Eosinophils Relative: 1 %
HCT: 51 % (ref 39.0–52.0)
Hemoglobin: 17.3 g/dL — ABNORMAL HIGH (ref 13.0–17.0)
Immature Granulocytes: 0 %
Lymphocytes Relative: 34 %
Lymphs Abs: 1.6 10*3/uL (ref 0.7–4.0)
MCH: 33 pg (ref 26.0–34.0)
MCHC: 33.9 g/dL (ref 30.0–36.0)
MCV: 97.1 fL (ref 80.0–100.0)
Monocytes Absolute: 0.6 10*3/uL (ref 0.1–1.0)
Monocytes Relative: 13 %
Neutro Abs: 2.4 10*3/uL (ref 1.7–7.7)
Neutrophils Relative %: 52 %
Platelets: 150 10*3/uL (ref 150–400)
RBC: 5.25 MIL/uL (ref 4.22–5.81)
RDW: 14.9 % (ref 11.5–15.5)
WBC: 4.8 10*3/uL (ref 4.0–10.5)
nRBC: 0 % (ref 0.0–0.2)

## 2022-09-29 LAB — PROTIME-INR
INR: 1.2 (ref 0.8–1.2)
Prothrombin Time: 14.6 seconds (ref 11.4–15.2)

## 2022-09-29 MED ORDER — FENTANYL CITRATE (PF) 100 MCG/2ML IJ SOLN
INTRAMUSCULAR | Status: AC
  Filled 2022-09-29: qty 4

## 2022-09-29 MED ORDER — FENTANYL CITRATE (PF) 100 MCG/2ML IJ SOLN
INTRAMUSCULAR | Status: AC | PRN
  Administered 2022-09-29: 50 ug via INTRAVENOUS

## 2022-09-29 MED ORDER — FENTANYL CITRATE (PF) 100 MCG/2ML IJ SOLN
INTRAMUSCULAR | Status: AC | PRN
  Administered 2022-09-29 (×2): 50 ug via INTRAVENOUS

## 2022-09-29 MED ORDER — IOHEXOL 300 MG/ML  SOLN
50.0000 mL | Freq: Once | INTRAMUSCULAR | Status: AC | PRN
  Administered 2022-09-29: 20 mL via INTRAVENOUS

## 2022-09-29 MED ORDER — DIPHENHYDRAMINE HCL 50 MG/ML IJ SOLN
INTRAMUSCULAR | Status: AC
  Filled 2022-09-29: qty 1

## 2022-09-29 MED ORDER — FENTANYL CITRATE (PF) 100 MCG/2ML IJ SOLN
INTRAMUSCULAR | Status: AC
  Filled 2022-09-29: qty 2

## 2022-09-29 MED ORDER — MIDAZOLAM HCL 2 MG/2ML IJ SOLN
INTRAMUSCULAR | Status: AC | PRN
  Administered 2022-09-29: 1 mg via INTRAVENOUS

## 2022-09-29 MED ORDER — LIDOCAINE HCL 1 % IJ SOLN
INTRAMUSCULAR | Status: AC | PRN
  Administered 2022-09-29: 5 mL via INTRADERMAL

## 2022-09-29 MED ORDER — SODIUM CHLORIDE 0.9 % IV SOLN: INTRAVENOUS | Status: DC

## 2022-09-29 MED ORDER — MIDAZOLAM HCL 2 MG/2ML IJ SOLN
INTRAMUSCULAR | Status: AC | PRN
  Administered 2022-09-29 (×5): .5 mg via INTRAVENOUS

## 2022-09-29 MED ORDER — IOHEXOL 300 MG/ML  SOLN
100.0000 mL | Freq: Once | INTRAMUSCULAR | Status: AC | PRN
  Administered 2022-09-29: 100 mL via INTRAVENOUS

## 2022-09-29 MED ORDER — MIDAZOLAM HCL 2 MG/2ML IJ SOLN
INTRAMUSCULAR | Status: AC
  Filled 2022-09-29: qty 2

## 2022-09-29 MED ORDER — LIDOCAINE HCL 1 % IJ SOLN
INTRAMUSCULAR | Status: AC
  Filled 2022-09-29: qty 20

## 2022-09-29 MED ORDER — ONDANSETRON HCL 4 MG/2ML IJ SOLN
INTRAMUSCULAR | Status: AC
  Administered 2022-09-29: 4 mg
  Filled 2022-09-29: qty 2

## 2022-09-29 MED ORDER — DIPHENHYDRAMINE HCL 50 MG/ML IJ SOLN
INTRAMUSCULAR | Status: AC | PRN
  Administered 2022-09-29: 50 mg via INTRAVENOUS

## 2022-09-29 MED ORDER — MIDAZOLAM HCL 2 MG/2ML IJ SOLN
INTRAMUSCULAR | Status: AC | PRN
  Administered 2022-09-29: 1 mg via INTRAVENOUS
  Administered 2022-09-29: 2 mg via INTRAVENOUS
  Administered 2022-09-29 (×2): 1 mg via INTRAVENOUS
  Administered 2022-09-29: 2 mg via INTRAVENOUS
  Administered 2022-09-29: 1 mg via INTRAVENOUS

## 2022-09-29 MED ORDER — MIDAZOLAM HCL 2 MG/2ML IJ SOLN
INTRAMUSCULAR | Status: AC | PRN
  Administered 2022-09-29: .5 mg via INTRAVENOUS

## 2022-09-29 MED ORDER — TRAMADOL HCL 50 MG PO TABS: 50.0000 mg | ORAL_TABLET | Freq: Once | ORAL | Status: DC | PRN

## 2022-09-29 MED ORDER — MIDAZOLAM HCL 2 MG/2ML IJ SOLN
INTRAMUSCULAR | Status: AC
  Filled 2022-09-29: qty 6

## 2022-09-29 MED ORDER — FENTANYL CITRATE (PF) 100 MCG/2ML IJ SOLN
INTRAMUSCULAR | Status: AC | PRN
  Administered 2022-09-29 (×5): 25 ug via INTRAVENOUS

## 2022-09-29 MED ORDER — ACETAMINOPHEN 500 MG PO TABS: 1000.0000 mg | ORAL_TABLET | Freq: Once | ORAL | Status: DC | PRN

## 2022-09-29 NOTE — Discharge Instructions (Addendum)
Please call Interventional Radiology clinic [phone] with any questions or concerns.  The office will call you to schedule an appointment.   You may remove your dressing and shower tomorrow. Check site daily and report any signs of infection to your provider.   Modify your activity for the next 1-2 days, no heavy lifting or strenuous activity.  Do not be in public places for 24 hours, you are at increased risk to fall.   Moderate Conscious Sedation, Adult, Care After This sheet gives you information about how to care for yourself after your procedure. Your health care provider may also give you more specific instructions. If you have problems or questions, contact your health careprovider. What can I expect after the procedure? After the procedure, it is common to have: Sleepiness for several hours. Impaired judgment for several hours. Difficulty with balance. Vomiting if you eat too soon. Follow these instructions at home: For the time period you were told by your health care provider: Rest. Do not participate in activities where you could fall or become injured. Do not drive or use machinery. Do not drink alcohol. Do not take sleeping pills or medicines that cause drowsiness. Do not make important decisions or sign legal documents. Do not take care of children on your own. Eating and drinking  Follow the diet recommended by your health care provider. Drink enough fluid to keep your urine pale yellow. If you vomit: Drink water, juice, or soup when you can drink without vomiting. Make sure you have little or no nausea before eating solid foods.  General instructions Take over-the-counter and prescription medicines only as told by your health care provider. Have a responsible adult stay with you for the time you are told. It is important to have someone help care for you until you are awake and alert. Do not smoke. Keep all follow-up visits as told by your health care  provider. This is important. Contact a health care provider if: You are still sleepy or having trouble with balance after 24 hours. You feel light-headed. You keep feeling nauseous or you keep vomiting. You develop a rash. You have a fever. You have redness or swelling around the IV site. Get help right away if: You have trouble breathing. You have new-onset confusion at home. Summary After the procedure, it is common to feel sleepy, have impaired judgment, or feel nauseous if you eat too soon. Rest after you get home. Know the things you should not do after the procedure. Follow the diet recommended by your health care provider and drink enough fluid to keep your urine pale yellow. Get help right away if you have trouble breathing or new-onset confusion at home. This information is not intended to replace advice given to you by your health care provider. Make sure you discuss any questions you have with your healthcare provider. Document Revised: 03/15/2020 Document Reviewed: 10/12/2019 Elsevier Patient Education  2022 [employer].

## 2022-09-29 NOTE — Procedures (Addendum)
  Procedure:  IVCgram, IVC filter removal (attempted)   Preprocedure diagnosis: The encounter diagnosis was Presence of IVC filter. Postprocedure diagnosis: same Operators:  Alexea Blase, [place] EBL:    minimal Complications:   none immediate  See full dictation in [employer].  Dillard Cannon MD Main # [phone] Pager  [phone] Mobile [phone]

## 2022-09-29 NOTE — Sedation Documentation (Signed)
#   20 Fr. (R) IJ venous sheath pulled and manual pressure applied by Jaci Standard RT.

## 2022-09-29 NOTE — H&P (Signed)
Chief Complaint: Patient was seen in consultation today for IVC filter retrieval   Referring Physician(s): Hassell,Daniel  Supervising Physician: Oley Balm  Patient Status: [facility] - Out-pt  History of Present Illness: Kiyon Fidalgo is a 51 y.o. male with a medical history significant for depression, intracerebral hemorrhage, pulmonary embolism and stroke. He presented to the hospital 02/12/21 after a syncopal event in which he fell and hit his face. He was found to have a right frontal/occipital hematoma. He was also found to have an unprovoked massive PE with right heart strain and an extensive left DVT. He was treated with tPA and a heparin infusion. He was discharged home 02/14/22 on Eliquis.   He returned to the ED via EMS as a Code Stroke on 02/15/22. He was found down in the [place] parking lot with altered mental status and reported seizure activity. CT head showed several intraparenchymal hemorrhages/hematomas and IR was consulted for IVC filter placement - this was performed 02/16/21 by Dr. Deanne Coffer.   He followed up with Dr. Deanne Coffer at our outpatient clinic 06/19/21 to discuss filter retrieval. His DVT had almost entirely resolved and the decision was made to leave the filter in place for a total of 6 months with a follow-up lower extremity US at that time. He was re-evaluated 12/23/21 and ultrasound imaging showed stable post-thrombotic changes int he proximal left femoral veins. Mr. Heffern was doing well, remaining active and tolerating anticoagulation.   On 01/23/22 IVC filter retrieval was attempted. The tip of the filter was embedded in the lateral wall of the IVC and despite multiple attempts the filter was unable to be retrieved. The procedure was aborted and plans were made for the patient to obtain a CTA venogram abdomen/pelvis to further evaluate anatomy and to reschedule the procedure.   Due to an illness the patient was unable to follow up with Dr. Deanne Coffer until  07/30/22. After reviewing the CTA imaging the risks and benefits of leaving the filter in place versus complex filter retrieval were discussed. The patient was motivated to proceed with filter retrieval.   Past Medical History:  Diagnosis Date   Depression    ICH (intracerebral hemorrhage) ([facility]) 02/15/2021   Pulmonary embolism ([facility]) 01/2021   Stroke [facility])    Vertigo     Past Surgical History:  Procedure Laterality Date   IR IVC FILTER PLMT / S&I /IMG GUID/MOD SED  02/16/2021   IR IVC FILTER RETRIEVAL / S&I /IMG GUID/MOD SED  01/23/2022   IR RADIOLOGIST EVAL & MGMT  06/19/2021   IR RADIOLOGIST EVAL & MGMT  07/30/2022   VASECTOMY Bilateral 2002    Allergies: Keppra [levetiracetam]  Medications: Prior to Admission medications   Medication Sig Start Date End Date Taking? Authorizing Provider  acetaminophen (TYLENOL) 500 MG tablet Take 500 mg by mouth every 6 (six) hours as needed for mild pain, fever or headache.    [provider]  cefdinir (OMNICEF) 300 MG capsule Take 1 capsule (300 mg total) by mouth every 12 (twelve) hours. 02/14/22   Lonia Blood, MD  ELIQUIS 5 MG TABS tablet Take 5 mg by mouth 2 (two) times daily. 09/23/21   [provider]  ibuprofen (ADVIL) 200 MG tablet Take 400 mg by mouth every 6 (six) hours as needed for fever or headache.    [provider]  traZODone (DESYREL) 100 MG tablet Take 100 mg by mouth at bedtime as needed for sleep.    [provider]  Family History  Problem Relation Age of Onset   Diabetes Father     Social History   Socioeconomic History   Marital status: Single    Spouse name: Not on file   Number of children: Not on file   Years of education: Not on file   Highest education level: Not on file  Occupational History   Occupation: full time  Tobacco Use   Smoking status: Never   Smokeless tobacco: Former    Types: Nurse, children's Use: Never used  Substance and Sexual  Activity   Alcohol use: Yes    Comment: 6-12 beers per week   Drug use: Never   Sexual activity: Not on file  Other Topics Concern   Not on file  Social History Narrative   Lives with son   Left Handed   Drinks 6-8 cups caffeine daily   Social Determinants of Health   Financial Resource Strain: Not on file  Food Insecurity: Not on file  Transportation Needs: Not on file  Physical Activity: Not on file  Stress: Not on file  Social Connections: Not on file    Review of Systems: A 12 point ROS discussed and pertinent positives are indicated in the HPI above.  All other systems are negative.  Review of Systems  Constitutional:  Negative for appetite change and fatigue.  Respiratory:  Negative for cough and shortness of breath.   Cardiovascular:  Negative for chest pain and leg swelling.  Gastrointestinal:  Negative for abdominal pain, diarrhea, nausea and vomiting.  Neurological:  Negative for dizziness and headaches.    Vital Signs: Ht 5\' 9"  (1.753 m)   Wt 222 lb (100.7 kg)   BMI 32.78 kg/m   Physical Exam Constitutional:      General: He is not in acute distress.    Appearance: He is not ill-appearing.  HENT:     Mouth/Throat:     Mouth: Mucous membranes are moist.     Pharynx: Oropharynx is clear.  Cardiovascular:     Rate and Rhythm: Normal rate and regular rhythm.     Pulses: Normal pulses.     Heart sounds: Normal heart sounds.  Pulmonary:     Effort: Pulmonary effort is normal.     Breath sounds: Normal breath sounds.  Abdominal:     General: Bowel sounds are normal.     Palpations: Abdomen is soft.     Tenderness: There is no abdominal tenderness.  Musculoskeletal:     Right lower leg: Edema present.     Left lower leg: Edema present.  Skin:    General: Skin is warm and dry.  Neurological:     Mental Status: He is alert and oriented to person, place, and time.  Psychiatric:        Mood and Affect: Mood is anxious.     Imaging: CT Head Wo  Contrast  Addendum Date: 09/19/2022   ADDENDUM REPORT: 09/19/2022 16:58 ADDENDUM: In comparison to prior CT head from 02/10/22, the hyperdensities seen along the right cerebral and cerebellar convexities were present on prior imaging and are favored to represent chronic calcificaitons related to prior hemorrhage. No acute abnormality. Electronically Signed   By: Marin Roberts M.D.   On: 09/19/2022 16:58   Result Date: 09/19/2022 CLINICAL DATA:  Dizziness EXAM: CT HEAD WITHOUT CONTRAST TECHNIQUE: Contiguous axial images were obtained from the base of the skull through the vertex without intravenous contrast. RADIATION DOSE REDUCTION: This exam was  performed according to the departmental dose-optimization program which includes automated exposure control, adjustment of the mA and/or kV according to patient size and/or use of iterative reconstruction technique. COMPARISON:  CT head 02/10/2022 FINDINGS: Brain: There is subarachnoid hemorrhage along the right temporal convexity (series 2, image 11), as well as the right cerebellar hemisphere (series 2, image 8). There is no evidence of intraventricular extension. No hydrocephalus. No evidence of acute infarct. Vascular:  No hyperdense vessel Skull: Normal. Negative for fracture or focal lesion. Sinuses/Orbits: No acute finding. Other: None. IMPRESSION: Subarachnoid hemorrhage along the right temporal convexity and right cerebellar hemisphere. No evidence of intraventricular extension or hydrocephalus. Electronically Signed: By: Lorenza Cambridge M.D. On: 09/19/2022 14:42   US Abdomen Limited RUQ (LIVER/GB)  Result Date: 09/19/2022 CLINICAL DATA:  Abnormal liver function tests EXAM: ULTRASOUND ABDOMEN LIMITED RIGHT UPPER QUADRANT COMPARISON:  None Available. FINDINGS: Gallbladder: No gallstones or wall thickening visualized. No sonographic Murphy sign noted by sonographer. Common bile duct: Diameter: 4.8 mm Liver: There is increased echogenicity suggesting fatty  infiltration. There is slightly inhomogeneous echogenicity in liver without discrete focal lesions. Portal vein is patent on color Doppler imaging with normal direction of blood flow towards the liver. Other: None. IMPRESSION: Fatty liver. No other sonographic abnormalities are seen in right upper quadrant of abdomen. Electronically Signed   By: Ernie Avena M.D.   On: 09/19/2022 16:04    Labs:  CBC: Recent Labs    02/12/22 0332 02/13/22 0315 02/14/22 0716 09/19/22 1345  WBC 6.5 4.4 4.8 6.7  HGB 15.6 14.8 16.9 16.9  HCT 44.3 43.3 48.5 49.9  PLT 99* 96* 123* 169    COAGS: Recent Labs    01/23/22 1247 02/11/22 1832  INR 1.1 1.5*    BMP: Recent Labs    02/12/22 0332 02/13/22 0315 02/14/22 0716 09/19/22 1345  NA 131* 131* 135 134*  K 3.5 3.4* 3.8 3.9  CL 97* 97* 101 95*  CO2 23 23 23 25   GLUCOSE 111* 121* 93 71  BUN 10 9 9 8   CALCIUM 7.6* 7.7* 8.7* 8.6*  CREATININE 1.51* 1.19 1.15 1.18  GFRNONAA 56* >60 >60 >60    LIVER FUNCTION TESTS: Recent Labs    02/11/22 1832 02/12/22 0332 02/13/22 0315 09/19/22 1345  BILITOT 1.3* 0.9 0.6 2.5*  AST 89* 62* 74* 520*  ALT 53* 39 38 337*  ALKPHOS 40 31* 28* 61  PROT 8.1 5.9* 5.9* 7.9  ALBUMIN 3.6 2.6* 2.6* 3.7    TUMOR MARKERS: No results for input(s): "AFPTM", "CEA", "CA199", "CHROMGRNA" in the last 8760 hours.  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   Risks and benefits discussed with the patient including, but not limited to bleeding, infection, contrast induced renal failure, filter fracture or migration which can lead to emergency surgery or even death, strut penetration with damage or irritation to adjacent structures and caval thrombosis.  All of the patient's questions were answered, patient  is agreeable to proceed. He has been NPO. He remains on anticoagulation (Eliquis).   Consent signed and in chart.  Thank you for this interesting consult.  I greatly enjoyed meeting Stephano Arrants and look forward to participating in their care.  A copy of this report was sent to the requesting provider on this date.  Electronically Signed: [facility], AGACNP-BC [phone] 09/29/2022, 12:06 PM  I spent a total of  30 Minutes   in face to face in clinical consultation, greater than 50% of which was counseling/coordinating care for complex IVC filter retrieval.

## 2022-10-05 ENCOUNTER — Other Ambulatory Visit: Payer: Self-pay | Admitting: Interventional Radiology

## 2022-10-05 DIAGNOSIS — Z95828 Presence of other vascular implants and grafts: Secondary | ICD-10-CM

## 2022-10-19 ENCOUNTER — Telehealth: Payer: Non-veteran care

## 2022-10-19 ENCOUNTER — Ambulatory Visit
Admission: RE | Admit: 2022-10-19 | Discharge: 2022-10-19 | Disposition: A | Discharge status: Home / Self Care | Payer: No Typology Code available for payment source | Source: Ambulatory Visit | Attending: Interventional Radiology | Admitting: Interventional Radiology

## 2022-10-19 ENCOUNTER — Other Ambulatory Visit: Payer: Self-pay | Admitting: Interventional Radiology

## 2022-10-19 DIAGNOSIS — Z95828 Presence of other vascular implants and grafts: Secondary | ICD-10-CM

## 2022-10-19 NOTE — Progress Notes (Signed)
Patient ID: Danny Lowe, male   DOB: 07/18/1971, 51 y.o.   MRN: [id number]       Chief Complaint: Patient was consulted remotely today (TeleHealth) for follow up attempted IVC filter retrieval at the request of Arline Ketter.    Referring Physician(s): Kaylani Fromme  History of Present Illness: Danny Lowe is a 51 y.o. male well know to our service since  02/16/2021 retrievable IVC filter placement due to PE in the setting of ICH He did well after outpatient neuro rehab. 06/19/2021 lower extremity US showed  acute DVT   virtually entirely resolved.  Focal residual post thrombotic change in the proximal left femoral vein, possibly source of   occasional pain. Filter left in place. 12/22/21 tolerating anticoagulation, US lower extremities stable, filter retrieval planned. 01/23/22  IVC filter retrieval (attempted) tip embedded in lateral wall of IVC, could not retrieve despite glidewire snare technique. 09/29/22 unsuccessful    filter retrieval using advanced techniques.Filter was left in juxtarenal position. No evidence of caval thrombus or injury on final cavagram .  Patient states his is doing well post procedure. No access site complications. He remains on anticoagulation. Back to full activity. Today is his birtthday.   Past Medical History:  Diagnosis Date   Depression    ICH (intracerebral hemorrhage) (HCC) 02/15/2021   Pulmonary embolism (HCC) 01/2021   Stroke [facility](HCC)    Vertigo     Past Surgical History:  Procedure Laterality Date   IR IVC FILTER PLMT / S&I /IMG GUID/MOD SED  02/16/2021   IR IVC FILTER RETRIEVAL / S&I /IMG GUID/MOD SED  01/23/2022   IR IVC FILTER RETRIEVAL / S&I Lenise Arena/IMG GUID/MOD SED  09/29/2022   IR RADIOLOGIST EVAL & MGMT  06/19/2021   IR RADIOLOGIST EVAL & MGMT  07/30/2022   VASECTOMY Bilateral 2002    Allergies: Nsaids and Keppra [levetiracetam]  Medications: Prior to Admission medications   Medication Sig Start Date End Date Taking?  Authorizing Provider  acetaminophen (TYLENOL) 500 MG tablet Take 500 mg by mouth every 6 (six) hours as needed for mild pain, fever or headache.    [provider]  cefdinir (OMNICEF) 300 MG capsule Take 1 capsule (300 mg total) by mouth every 12 (twelve) hours. 02/14/22   Lonia BloodMcClung, Jeffrey T, MD  ELIQUIS 5 MG TABS tablet Take 5 mg by mouth 2 (two) times daily. 09/23/21   [provider]  ibuprofen (ADVIL) 200 MG tablet Take 400 mg by mouth every 6 (six) hours as needed for fever or headache.    [provider]  meclizine (ANTIVERT) 25 MG tablet Take 25 mg by mouth 3 (three) times daily as needed for dizziness. Unsure of dosage    [provider]  traZODone (DESYREL) 100 MG tablet Take 100 mg by mouth at bedtime as needed for sleep.    [provider]     Family History  Problem Relation Age of Onset   Diabetes Father     Social History   Socioeconomic History   Marital status: Single    Spouse name: Not on file   Number of children: Not on file   Years of education: Not on file   Highest education level: Not on file  Occupational History   Occupation: full time  Tobacco Use   Smoking status: Never   Smokeless tobacco: Former    Types: Associate ProfessorChew  Vaping Use   Vaping Use: Never used  Substance and Sexual Activity   Alcohol use: Yes    Comment:  6-12 beers per week   Drug use: Never   Sexual activity: Not on file  Other Topics Concern   Not on file  Social History Narrative   Lives with son   Left Handed   Drinks 6-8 cups caffeine daily   Social Determinants of Health   Financial Resource Strain: Not on file  Food Insecurity: Not on file  Transportation Needs: Not on file  Physical Activity: Not on file  Stress: Not on file  Social Connections: Not on file    ECOG Status: 0 - Asymptomatic  Review of Systems  Review of Systems: A 12 point ROS discussed and pertinent positives are indicated in the HPI above.  All other systems  are negative.    Physical Exam No direct physical exam was performed (except for noted visual exam findings with Video Visits).     Vital Signs: There were no vitals taken for this visit.  Imaging: IR IVC Filter Retrieval / S&I /Img Guid/Mod Sed  Result Date: 09/29/2022 INDICATION: History of DVT/PE and  intracranial hemorrhage requiring placement of retrievable IVC filter. Patient is now tolerating anticoagulation. Recent attempted filter retrieval was complicated by filter tilt and embedded tip. Patient returns for removal. EXAM: 1. IR IVC FILTER RETRIEVAL/S+I/ IMAGE GUIDE MODERATE SEDATION (ATTEMPTED) 2. IR ULTRASOUND GUIDANCE VASC ACCESS COMPARISON:  01/26/2022 MEDICATIONS: No periprocedural antibiotics were indicated ANESTHESIA/SEDATION: Intravenous Fentanyl and Versed 12mg  were administered as conscious sedation during continuous monitoring of the patient's level of consciousness and physiological / cardiorespiratory status by the radiology RN, with a total moderate sedation time of 241 minutes. CONTRAST:  OMNIPAQUE IOHEXOL 300 MG/ML SOLN, 31mL OMNIPAQUE IOHEXOL 300 MG/ML SOLN TECHNIQUE: Informed written consent was obtained from the patient after a discussion of the risks, benefits and alternatives to treatment. Questions regarding the procedure were encouraged and answered. A timeout was performed prior to the initiation of the procedure. The right neck was prepped and draped in the usual sterile fashion, and a sterile drape was applied covering the operative field. Maximum barrier sterile technique with sterile gowns and gloves were used for the procedure. A timeout was performed prior to the initiation of the procedure. Local anesthesia was provided with 1% lidocaine. Under direct ultrasound guidance, the right internal jugular vein was accessed with a micropuncture needle set after the overlying soft tissues were anesthetized with 1% lidocaine. An ultrasound image was saved for  documentation purposes. With the use of a Benson wire, a pigtail catheter was advanced to the level of the caudal IVC and an inferior venacavagram was performed. A steep LAO projection was then obtained to better demonstrate caval wall positioning of the filter tip. The vascular sheath was exchanged for a 16 French Argon coaxial filter set. Lung rigid vascular forceps were advanced but could not be directed to engage the tip of the filter. A 5 30m Sos catheter was advanced and used to encircle the proximal legs of the filter. A 035 Glidewire was advanced through the catheter. A 6 French ensnare device was advanced in a parallel fashion through the sheath, used to capture the Glidewire which was then brought up as a loop. Attempts were made to free the tip and hook from the wall of the IVC in order to capture the filter but ultimately these were unsuccessful, despite repositioning the lumen around the tines again with the Sos catheter. The filter could be repositioned slightly more cephalad in hopes of placing the tip in a more favorable position for capture,  but the tip by seemed to remain subintimal. A variety of endoscopic and laparoscopic forceps devices were entertained in attempts to direct the tip of the filter into the sheath, ultimately unsuccessful. Various attempts to engage the tip did advance the device or more cephalad. Ultimately kinking of 2 of the legs was noted with traction on the filter. At this point, the procedure was terminated. Follow-up cavogram was obtained. All wires, catheters and sheaths were removed from the patient. Hemostasis was achieved at the right neck access site with manual compression. A dressing was placed. The patient tolerated the above procedure well without immediate postprocedural complication. Operators: Everardo Pacific COMPLICATIONS: None immediate FLUOROSCOPY TIME:  Radiation Exposure Index (as provided by the fluoroscopic device): 5152 MGy air Kerma FINDINGS:  Inferior venacavagram demonstrates wide patency of the IVC. There is no thrombus within the infrarenal IVC filter which appears unchanged in position from the previous procedure, tip directed towards the right anterolateral wall of the infrarenal IVC. The IVC filter could not be successfully retrieved despite use of advanced technique. Manipulation resulted in partial kinking of 2 of the legs of the filter, and repositioning into a juxtarenal position. Completion inferior venacavagram was negative for caval injury, no significant caval thrombus. Good renal vein inflows at the level of the filter. The suprarenal cava remains unremarkable. IMPRESSION: 1. Unsuccessful IVC filter retrieval using advanced techniques. 2. Filter was left in juxtarenal position. 3. No evidence of caval thrombus or injury on final cavagram. PLAN: Follow-up in clinic in 2-4 weeks, discuss options. Electronically Signed   By: Corlis Leak M.D.   On: 09/29/2022 20:32   CT Head Wo Contrast  Addendum Date: 09/19/2022   ADDENDUM REPORT: 09/19/2022 16:58 ADDENDUM: In comparison to prior CT head from 02/10/22, the hyperdensities seen along the right cerebral and cerebellar convexities were present on prior imaging and are favored to represent chronic calcificaitons related to prior hemorrhage. No acute abnormality. Electronically Signed   By: Lorenza Cambridge M.D.   On: 09/19/2022 16:58   Result Date: 09/19/2022 CLINICAL DATA:  Dizziness EXAM: CT HEAD WITHOUT CONTRAST TECHNIQUE: Contiguous axial images were obtained from the base of the skull through the vertex without intravenous contrast. RADIATION DOSE REDUCTION: This exam was performed according to the departmental dose-optimization program which includes automated exposure control, adjustment of the mA and/or kV according to patient size and/or use of iterative reconstruction technique. COMPARISON:  CT head 02/10/2022 FINDINGS: Brain: There is subarachnoid hemorrhage along the right temporal  convexity (series 2, image 11), as well as the right cerebellar hemisphere (series 2, image 8). There is no evidence of intraventricular extension. No hydrocephalus. No evidence of acute infarct. Vascular:  No hyperdense vessel Skull: Normal. Negative for fracture or focal lesion. Sinuses/Orbits: No acute finding. Other: None. IMPRESSION: Subarachnoid hemorrhage along the right temporal convexity and right cerebellar hemisphere. No evidence of intraventricular extension or hydrocephalus. Electronically Signed: By: Lorenza Cambridge M.D. On: 09/19/2022 14:42   US Abdomen Limited RUQ (LIVER/GB)  Result Date: 09/19/2022 CLINICAL DATA:  Abnormal liver function tests EXAM: ULTRASOUND ABDOMEN LIMITED RIGHT UPPER QUADRANT COMPARISON:  None Available. FINDINGS: Gallbladder: No gallstones or wall thickening visualized. No sonographic Murphy sign noted by sonographer. Common bile duct: Diameter: 4.8 mm Liver: There is increased echogenicity suggesting fatty infiltration. There is slightly inhomogeneous echogenicity in liver without discrete focal lesions. Portal vein is patent on color Doppler imaging with normal direction of blood flow towards the liver. Other: None. IMPRESSION: Fatty liver. No other sonographic abnormalities  are seen in right upper quadrant of abdomen. Electronically Signed   By: Ernie Avena M.D.   On: 09/19/2022 16:04    Labs:  CBC: Recent Labs    02/13/22 0315 02/14/22 0716 09/19/22 1345 09/29/22 1131  WBC 4.4 4.8 6.7 4.8  HGB 14.8 16.9 16.9 17.3*  HCT 43.3 48.5 49.9 51.0  PLT 96* 123* 169 150    COAGS: Recent Labs    01/23/22 1247 02/11/22 1832 09/29/22 1131  INR 1.1 1.5* 1.2    BMP: Recent Labs    02/13/22 0315 02/14/22 0716 09/19/22 1345 09/29/22 1131  NA 131* 135 134* 139  K 3.4* 3.8 3.9 3.6  CL 97* 101 95* 102  CO2 23 23 25 28   GLUCOSE 121* 93 71 94  BUN 9 9 8 14   CALCIUM 7.7* 8.7* 8.6* 9.0  CREATININE 1.19 1.15 1.18 1.22  GFRNONAA >60 >60 >60 >60     LIVER FUNCTION TESTS: Recent Labs    02/11/22 1832 02/12/22 0332 02/13/22 0315 09/19/22 1345  BILITOT 1.3* 0.9 0.6 2.5*  AST 89* 62* 74* 520*  ALT 53* 39 38 337*  ALKPHOS 40 31* 28* 61  PROT 8.1 5.9* 5.9* 7.9  ALBUMIN 3.6 2.6* 2.6* 3.7    TUMOR MARKERS: No results for input(s): "AFPTM", "CEA", "CA199", "CHROMGRNA" in the last 8760 hours.  Assessment and Plan:  My impression is that the patient is doing well post attempted IVC filter retrieval x2. No current symptoms. Remains on anticoagulation.  We discussed options of (1) leave filter in place as it is approved a permanent device (2) reattempt retrieval or (3) referral to tertiary care center for consultation. He elects to leave filter in place for now.   We reviewed possible risks and complications of long-term caval filtration including filter fracture, caval wall and adjacent organ perforation, and fragment migration as well as caval thrombosis.  The patient seemed to understand and had   questions answered. He will remain on anticoagulation. He can d/c anticoagulation for upcoming colonoscopy and other procedures in the future as needed. He will seek medical attention should any new symptoms suggest possible DVT or filter complications. I can see him back on an as-needed basis.   Thank you for this interesting consult.  I greatly enjoyed meeting Danny Lowe and look forward to participating in their care.  A copy of this report was sent to the requesting provider on this date.  Electronically Signed: 09/21/22 10/19/2022, 10:03 AM   I spent a total of    25 Minutes in remote  clinical consultation, greater than 50% of which was counseling/coordinating care for presence of IVC filter.    Visit type: Audio only (telephone). Audio (no video) only due to patient's lack of internet/smartphone capability. Alternative for in-person consultation at [facility], [address] [place], [place], [place]. This  visit type was conducted due to national recommendations for restrictions regarding the COVID-19 Pandemic (e.g. social distancing).  This format is felt to be most appropriate for this patient at this time.  All issues noted in this document were discussed and addressed.

## 2022-12-02 ENCOUNTER — Ambulatory Visit: Payer: Non-veteran care | Admitting: Neurology

## 2022-12-02 ENCOUNTER — Encounter: Payer: Self-pay | Admitting: Neurology

## 2022-12-02 DIAGNOSIS — Z029 Encounter for administrative examinations, unspecified: Secondary | ICD-10-CM

## 2023-01-20 ENCOUNTER — Emergency Department
Admission: EM | Admit: 2023-01-20 | Discharge: 2023-01-20 | Disposition: A | Discharge status: Home / Self Care | Payer: No Typology Code available for payment source | Attending: Emergency Medicine | Admitting: Emergency Medicine

## 2023-01-20 ENCOUNTER — Other Ambulatory Visit: Payer: Self-pay

## 2023-01-20 ENCOUNTER — Emergency Department: Payer: No Typology Code available for payment source

## 2023-01-20 ENCOUNTER — Encounter: Payer: Self-pay | Admitting: Radiology

## 2023-01-20 DIAGNOSIS — E860 Dehydration: Secondary | ICD-10-CM

## 2023-01-20 DIAGNOSIS — R7401 Elevation of levels of liver transaminase levels: Secondary | ICD-10-CM | POA: Diagnosis not present

## 2023-01-20 DIAGNOSIS — R600 Localized edema: Secondary | ICD-10-CM | POA: Insufficient documentation

## 2023-01-20 DIAGNOSIS — U071 COVID-19: Secondary | ICD-10-CM | POA: Insufficient documentation

## 2023-01-20 DIAGNOSIS — R7402 Elevation of levels of lactic acid dehydrogenase (LDH): Secondary | ICD-10-CM | POA: Insufficient documentation

## 2023-01-20 DIAGNOSIS — R1032 Left lower quadrant pain: Secondary | ICD-10-CM | POA: Insufficient documentation

## 2023-01-20 DIAGNOSIS — R509 Fever, unspecified: Secondary | ICD-10-CM | POA: Diagnosis present

## 2023-01-20 DIAGNOSIS — A419 Sepsis, unspecified organism: Secondary | ICD-10-CM | POA: Diagnosis not present

## 2023-01-20 DIAGNOSIS — Z8673 Personal history of transient ischemic attack (TIA), and cerebral infarction without residual deficits: Secondary | ICD-10-CM | POA: Diagnosis not present

## 2023-01-20 LAB — COMPREHENSIVE METABOLIC PANEL
ALT: 310 U/L — ABNORMAL HIGH (ref 0–44)
AST: 587 U/L — ABNORMAL HIGH (ref 15–41)
Albumin: 3.7 g/dL (ref 3.5–5.0)
Alkaline Phosphatase: 68 U/L (ref 38–126)
Anion gap: 12 (ref 5–15)
BUN: 11 mg/dL (ref 6–20)
CO2: 23 mmol/L (ref 22–32)
Calcium: 8.8 mg/dL — ABNORMAL LOW (ref 8.9–10.3)
Chloride: 94 mmol/L — ABNORMAL LOW (ref 98–111)
Creatinine, Ser: 1.13 mg/dL (ref 0.61–1.24)
GFR, Estimated: >60 mL/min (ref >60)
Glucose, Bld: 123 mg/dL — ABNORMAL HIGH (ref 70–99)
Potassium: 4 mmol/L (ref 3.5–5.1)
Sodium: 129 mmol/L — ABNORMAL LOW (ref 135–145)
Total Bilirubin: 2.5 mg/dL — ABNORMAL HIGH (ref 0.3–1.2)
Total Protein: 8.3 g/dL — ABNORMAL HIGH (ref 6.5–8.1)

## 2023-01-20 LAB — CBC WITH DIFFERENTIAL/PLATELET
Abs Immature Granulocytes: 0.02 10*3/uL (ref 0.00–0.07)
Basophils Absolute: 0 10*3/uL (ref 0.0–0.1)
Basophils Relative: 1 %
Eosinophils Absolute: 0 10*3/uL (ref 0.0–0.5)
Eosinophils Relative: 0 %
HCT: 47 % (ref 39.0–52.0)
Hemoglobin: 16.4 g/dL (ref 13.0–17.0)
Immature Granulocytes: 0 %
Lymphocytes Relative: 9 %
Lymphs Abs: 0.5 10*3/uL — ABNORMAL LOW (ref 0.7–4.0)
MCH: 31.9 pg (ref 26.0–34.0)
MCHC: 34.9 g/dL (ref 30.0–36.0)
MCV: 91.4 fL (ref 80.0–100.0)
Monocytes Absolute: 0.4 10*3/uL (ref 0.1–1.0)
Monocytes Relative: 7 %
Neutro Abs: 4.6 10*3/uL (ref 1.7–7.7)
Neutrophils Relative %: 83 %
Platelets: 148 10*3/uL — ABNORMAL LOW (ref 150–400)
RBC: 5.14 MIL/uL (ref 4.22–5.81)
RDW: 13.6 % (ref 11.5–15.5)
WBC: 5.6 10*3/uL (ref 4.0–10.5)
nRBC: 0 % (ref 0.0–0.2)

## 2023-01-20 LAB — PROTIME-INR
INR: 1.4 — ABNORMAL HIGH (ref 0.8–1.2)
Prothrombin Time: 17.3 seconds — ABNORMAL HIGH (ref 11.4–15.2)

## 2023-01-20 LAB — RESP PANEL BY RT-PCR (RSV, FLU A&B, COVID)  RVPGX2
Influenza A by PCR: NEGATIVE
Influenza B by PCR: NEGATIVE
Resp Syncytial Virus by PCR: NEGATIVE
SARS Coronavirus 2 by RT PCR: POSITIVE — AB

## 2023-01-20 LAB — URINALYSIS, W/ REFLEX TO CULTURE (INFECTION SUSPECTED)
Bacteria, UA: NONE SEEN
Bilirubin Urine: NEGATIVE
Glucose, UA: NEGATIVE mg/dL
Hgb urine dipstick: NEGATIVE
Ketones, ur: NEGATIVE mg/dL
Leukocytes,Ua: NEGATIVE
Nitrite: NEGATIVE
Protein, ur: NEGATIVE mg/dL
Specific Gravity, Urine: 1.035 — ABNORMAL HIGH (ref 1.005–1.030)
pH: 7 (ref 5.0–8.0)

## 2023-01-20 LAB — LACTIC ACID, PLASMA
Lactic Acid, Venous: 2.3 mmol/L (ref 0.5–1.9)
Lactic Acid, Venous: 5 mmol/L (ref 0.5–1.9)
Lactic Acid, Venous: 2.6 mmol/L (ref 0.5–1.9)

## 2023-01-20 LAB — APTT: aPTT: 29 seconds (ref 24–36)

## 2023-01-20 MED ORDER — METRONIDAZOLE 500 MG/100ML IV SOLN
500.0000 mg | Freq: Once | INTRAVENOUS | Status: AC
  Administered 2023-01-20: 500 mg via INTRAVENOUS
  Filled 2023-01-20: qty 100

## 2023-01-20 MED ORDER — ACETAMINOPHEN 500 MG PO TABS
1000.0000 mg | ORAL_TABLET | Freq: Once | ORAL | Status: AC
  Administered 2023-01-20: 1000 mg via ORAL
  Filled 2023-01-20: qty 2

## 2023-01-20 MED ORDER — VANCOMYCIN HCL 2000 MG/400ML IV SOLN
2000.0000 mg | Freq: Once | INTRAVENOUS | Status: AC
  Administered 2023-01-20: 2000 mg via INTRAVENOUS
  Filled 2023-01-20: qty 400

## 2023-01-20 MED ORDER — VANCOMYCIN HCL IN DEXTROSE 1-5 GM/200ML-% IV SOLN: 1000.0000 mg | Freq: Once | INTRAVENOUS | Status: DC

## 2023-01-20 MED ORDER — SODIUM CHLORIDE 0.9 % IV SOLN
2.0000 g | Freq: Once | INTRAVENOUS | Status: AC
  Administered 2023-01-20: 2 g via INTRAVENOUS
  Filled 2023-01-20: qty 12.5

## 2023-01-20 MED ORDER — PAXLOVID (300/100) 20 X 150 MG & 10 X 100MG PO TBPK: 3.0000 | ORAL_TABLET | Freq: Two times a day (BID) | ORAL | 0 refills | Status: AC

## 2023-01-20 MED ORDER — LACTATED RINGERS IV SOLN: INTRAVENOUS | Status: DC

## 2023-01-20 MED ORDER — LACTATED RINGERS IV BOLUS (SEPSIS)
1000.0000 mL | Freq: Once | INTRAVENOUS | Status: AC
  Administered 2023-01-20: 1000 mL via INTRAVENOUS

## 2023-01-20 MED ORDER — ONDANSETRON 4 MG PO TBDP: 4.0000 mg | ORAL_TABLET | Freq: Three times a day (TID) | ORAL | 0 refills | Status: AC | PRN

## 2023-01-20 MED ORDER — IOHEXOL 300 MG/ML  SOLN
100.0000 mL | Freq: Once | INTRAMUSCULAR | Status: AC | PRN
  Administered 2023-01-20: 100 mL via INTRAVENOUS

## 2023-01-20 MED ORDER — LACTATED RINGERS IV BOLUS
1000.0000 mL | Freq: Once | INTRAVENOUS | Status: AC
  Administered 2023-01-20: 1000 mL via INTRAVENOUS

## 2023-01-20 NOTE — Sepsis Progress Note (Signed)
eLink is monitoring this Code Sepsis. °

## 2023-01-20 NOTE — ED Notes (Signed)
Pt taken from CT to [place] which caused delay in ABX admin.

## 2023-01-20 NOTE — Progress Notes (Signed)
CODE SEPSIS - PHARMACY COMMUNICATION  **Broad Spectrum Antibiotics should be administered within 1 hour of Sepsis diagnosis**  Time Code Sepsis Called/Page Received: 1431  Antibiotics Ordered: cefepime, vancomycin  Time of 1st antibiotic administration: 1552  Additional action taken by pharmacy: Secure chatted RN, patient unavailable.  In CT.  She will give when  If necessary, Name of Provider/Nurse Contacted: Eduard Clos, RN    Alison Murray ,PharmD Clinical Pharmacist  01/20/2023  3:41 PM

## 2023-01-20 NOTE — ED Notes (Signed)
Critical Result  Lactic Acid 2.3  MD notified

## 2023-01-20 NOTE — ED Provider Notes (Signed)
Procedures     ----------------------------------------- 8:34 PM on 01/20/2023 -----------------------------------------   Workup is unremarkable except for COVID-positive.  Lactate initially trended up from 2.3-5.0 which I think is erroneous.  Patient was given additional fluids, and repeat is 2.6.  No other identified sources of infection.  Urinalysis, chest x-ray, CT abdomen pelvis, ultrasound right upper quadrant all unremarkable.  At this point sepsis is ruled out.  Patient is feeling much better, tolerating oral intake, energy is improved, sitting upright.  Comfortable with outpatient management.  Prescription for Zofran and Paxlovid sent to his [facility] in [place].   Carrie Mew, MD 01/20/23 2035

## 2023-01-20 NOTE — ED Triage Notes (Signed)
Pt here with multiple issues. Pt has a has a filter in his vena cava and its piercing his main artery in 2 places also making him swell. Pt has been bleeding out of his penis, anus, and mouth Monday night. Pt has been taking diuretics to help with the swelling. Pt states today he woke up with a fever of 101.

## 2023-01-20 NOTE — Sepsis Progress Note (Signed)
Notified provider of need to order fluid bolus.  ?

## 2023-01-20 NOTE — Consult Note (Signed)
PHARMACY -  BRIEF ANTIBIOTIC NOTE   Pharmacy has received consult(s) for vancomycin and cefepime from an ED provider.  The patient's profile has been reviewed for ht/wt/allergies/indication/available labs.    One time order(s) placed for vancomycin 2000 mg IV and cefepime 2 gm IV  Further antibiotics/pharmacy consults should be ordered by admitting physician if indicated.                       Thank you, Alison Murray 01/20/2023  3:39 PM

## 2023-01-20 NOTE — ED Provider Notes (Signed)
[facility] Provider Note    Event Date/Time   First MD Initiated Contact with Patient 01/20/23 1439     (approximate)  History   Chief Complaint: Fever  HPI  Danny Lowe is a 52 y.o. male with a past medical history of depression, prior pulmonary embolism status post IVC filter, CVA, presents to the emergency department for fever.  According to the patient since yesterday he has felt like he has had a fever, states he is noted recently some blood in his stool he has followed up with the [place] for this.  Does states some mild lower abdominal pain.  He states several weeks ago he noticed some blood in his urine.  Patient states he noted a temperature of 101 today so he came to the emergency department.  Patient gives a complicated past medical history of having an IVC filter placed sometime in 2020 it was supposed to be placed for 6 months.  Patient however when they attempted to remove it they were unsuccessful and they noted that had punctured the wall of the vena cava.  He once again attempted to have it removed over a year ago unsuccessfully.  Patient has been referred to [facility] vascular surgery to have it removed but is currently working with the [facility] to set that appointment up.  Patient states he also noted last night he developed a cough.  Physical Exam   Triage Vital Signs: ED Triage Vitals  Enc Vitals Group     BP 01/20/23 1352 (!) 139/101     Pulse Rate 01/20/23 1352 (!) 111     Resp 01/20/23 1352 18     Temp 01/20/23 1353 100.3 F (37.9 C)     Temp Source 01/20/23 1353 Oral     SpO2 01/20/23 1352 96 %     Weight 01/20/23 1353 222 lb 0.1 oz (100.7 kg)     Height 01/20/23 1353 5' 9"$  (1.753 m)     Head Circumference --      Peak Flow --      Pain Score 01/20/23 1353 0     Pain Loc --      Pain Edu? --      Excl. in [place]? --     Most recent vital signs: Vitals:   01/20/23 1353 01/20/23 1430  BP:  126/87  Pulse:  90  Resp:  15  Temp: 100.3  F (37.9 C) 100.2 F (37.9 C)  SpO2:  97%    General: Awake, no distress.  CV:  Good peripheral perfusion.  Regular rate and rhythm  Resp:  Normal effort.  Equal breath sounds bilaterally.  Abd:  No distention.  Soft, mild left lower quadrant tenderness no rebound or guarding. Other:  1+ lower extreme edema bilaterally mostly pedal edema.  Left slightly greater than right   ED Results / Procedures / Treatments   EKG  EKG viewed and interpreted by myself shows sinus tachycardia 101 bpm with a narrow QRS, normal axis, normal intervals, no concerning ST changes.  RADIOLOGY  Imaging is pending   MEDICATIONS ORDERED IN ED: Medications  lactated ringers infusion (has no administration in time range)  lactated ringers bolus 1,000 mL (has no administration in time range)  ceFEPIme (MAXIPIME) 2 g in sodium chloride 0.9 % 100 mL IVPB (has no administration in time range)  metroNIDAZOLE (FLAGYL) IVPB 500 mg (has no administration in time range)  vancomycin (VANCOREADY) IVPB 2000 mg/400 mL (has no administration in time  range)     IMPRESSION / MDM / ASSESSMENT AND PLAN / ED COURSE  I reviewed the triage vital signs and the nursing notes.  Patient's presentation is most consistent with acute presentation with potential threat to life or bodily function.  Patient presents emergency department with a fever.  He also states mild cough last night.  Patient gives a complicated medical history of a recent IVC filter placed several years ago to attempted removals unsuccessfully and patient is now referred to [facility] vascular surgery for removal.  I do not believe the patient's symptoms/fever are likely related to this.  However the patient does have some mild left lower quadrant tenderness and states recently he has noted some blood within his stool he is already followed up with the [facility] for this, but this would raise concerns for possible colitis.  We will obtain a CT scan abdomen/pelvis to  further evaluate.  Given the patient's cough and a fever today we will obtain a chest x-ray as well as a COVID/flu/RSV as a precaution.  The patient is febrile and tachycardic we will check labs, cultures, start on broad-spectrum antibiotics and continue to closely monitor.  Patient's labs have begun to result showing a normal CBC with a normal white blood cell count, lactic acid is elevated at 2.3, patient receiving IV fluids.  Patient's chemistry does show LFT elevation although not tender in the right upper quadrant CT scan is pending.  We will also obtain a urine sample and continue to closely monitor.    Patient care signed out to oncoming provider, imaging urinalysis and COVID pending   CRITICAL CARE Performed by: Harvest Dark   Total critical care time: 30 minutes  Critical care time was exclusive of separately billable procedures and treating other patients.  Critical care was necessary to treat or prevent imminent or life-threatening deterioration.  Critical care was time spent personally by me on the following activities: development of treatment plan with patient and/or surrogate as well as nursing, discussions with consultants, evaluation of patient's response to treatment, examination of patient, obtaining history from patient or surrogate, ordering and performing treatments and interventions, ordering and review of laboratory studies, ordering and review of radiographic studies, pulse oximetry and re-evaluation of patient's condition.   FINAL CLINICAL IMPRESSION(S) / ED DIAGNOSES   Sepsis Fever   Note:  This document was prepared using Dragon voice recognition software and may include unintentional dictation errors.   Harvest Dark, MD 01/20/23 [phone]

## 2023-01-25 LAB — CULTURE, BLOOD (ROUTINE X 2)
Culture: NO GROWTH
Culture: NO GROWTH
Special Requests: ADEQUATE

## 2023-05-08 ENCOUNTER — Encounter: Payer: Self-pay | Admitting: *Deleted

## 2023-05-08 ENCOUNTER — Emergency Department
Admission: EM | Admit: 2023-05-08 | Discharge: 2023-05-08 | Disposition: A | Discharge status: Home / Self Care | Payer: No Typology Code available for payment source | Attending: Emergency Medicine | Admitting: Emergency Medicine

## 2023-05-08 ENCOUNTER — Other Ambulatory Visit: Payer: Self-pay

## 2023-05-08 DIAGNOSIS — R531 Weakness: Secondary | ICD-10-CM | POA: Diagnosis present

## 2023-05-08 DIAGNOSIS — E860 Dehydration: Secondary | ICD-10-CM | POA: Diagnosis not present

## 2023-05-08 LAB — URINALYSIS, ROUTINE W REFLEX MICROSCOPIC
Bilirubin Urine: NEGATIVE
Glucose, UA: NEGATIVE mg/dL
Hgb urine dipstick: NEGATIVE
Ketones, ur: NEGATIVE mg/dL
Leukocytes,Ua: NEGATIVE
Nitrite: NEGATIVE
Protein, ur: NEGATIVE mg/dL
Specific Gravity, Urine: 1.009 (ref 1.005–1.030)
pH: 6 (ref 5.0–8.0)

## 2023-05-08 LAB — CBC WITH DIFFERENTIAL/PLATELET
Abs Immature Granulocytes: 0.01 10*3/uL (ref 0.00–0.07)
Basophils Absolute: 0 10*3/uL (ref 0.0–0.1)
Basophils Relative: 1 %
Eosinophils Absolute: 0 10*3/uL (ref 0.0–0.5)
Eosinophils Relative: 1 %
HCT: 45.6 % (ref 39.0–52.0)
Hemoglobin: 16.2 g/dL (ref 13.0–17.0)
Immature Granulocytes: 0 %
Lymphocytes Relative: 33 %
Lymphs Abs: 1.7 10*3/uL (ref 0.7–4.0)
MCH: 33.1 pg (ref 26.0–34.0)
MCHC: 35.5 g/dL (ref 30.0–36.0)
MCV: 93.3 fL (ref 80.0–100.0)
Monocytes Absolute: 0.6 10*3/uL (ref 0.1–1.0)
Monocytes Relative: 12 %
Neutro Abs: 2.8 10*3/uL (ref 1.7–7.7)
Neutrophils Relative %: 53 %
Platelets: 179 10*3/uL (ref 150–400)
RBC: 4.89 MIL/uL (ref 4.22–5.81)
RDW: 12.1 % (ref 11.5–15.5)
WBC: 5.2 10*3/uL (ref 4.0–10.5)
nRBC: 0 % (ref 0.0–0.2)

## 2023-05-08 LAB — COMPREHENSIVE METABOLIC PANEL
ALT: 82 U/L — ABNORMAL HIGH (ref 0–44)
AST: 141 U/L — ABNORMAL HIGH (ref 15–41)
Albumin: 3.8 g/dL (ref 3.5–5.0)
Alkaline Phosphatase: 91 U/L (ref 38–126)
Anion gap: 13 (ref 5–15)
BUN: 12 mg/dL (ref 6–20)
CO2: 23 mmol/L (ref 22–32)
Calcium: 8.5 mg/dL — ABNORMAL LOW (ref 8.9–10.3)
Chloride: 98 mmol/L (ref 98–111)
Creatinine, Ser: 1.15 mg/dL (ref 0.61–1.24)
GFR, Estimated: >60 mL/min (ref >60)
Glucose, Bld: 119 mg/dL — ABNORMAL HIGH (ref 70–99)
Potassium: 3.7 mmol/L (ref 3.5–5.1)
Sodium: 134 mmol/L — ABNORMAL LOW (ref 135–145)
Total Bilirubin: 1.8 mg/dL — ABNORMAL HIGH (ref 0.3–1.2)
Total Protein: 7.8 g/dL (ref 6.5–8.1)

## 2023-05-08 LAB — D-DIMER, QUANTITATIVE: D-Dimer, Quant: 0.29 ug/mL-FEU (ref 0.00–0.50)

## 2023-05-08 MED ORDER — SODIUM CHLORIDE 0.9 % IV BOLUS
1000.0000 mL | Freq: Once | INTRAVENOUS | Status: AC
  Administered 2023-05-08: 1000 mL via INTRAVENOUS

## 2023-05-08 NOTE — Discharge Instructions (Signed)
Please seek medical attention for any high fevers, chest pain, shortness of breath, change in behavior, persistent vomiting, bloody stool or any other new or concerning symptoms.  

## 2023-05-08 NOTE — ED Triage Notes (Signed)
Here by POV from home for "feeling sluggish". Endorses mild diarrhea, HA, light headedness, and fatigue s/p colonoscopy on Tuesday. States, they found some polyps, and has since had  some bloody stool. Takes blood thinner apixiban. Denies fever, syncope, nausea, vomiting, or dizziness. Alert, NAD, calm, interactive.

## 2023-05-08 NOTE — ED Provider Notes (Signed)
[facility] Provider Note    Event Date/Time   First MD Initiated Contact with Patient 05/08/23 1637     (approximate)   History   Fatigue   HPI  Danny Lowe is a 52 y.o. male  who presents to the emergency department today because of concern for fatigue and weakness. The patient says that he had a colonoscopy performed 4 days ago. Since then he has been feeling fatigued. Has had decreased appetite. Was hoping he would feel better but has not. Denies any fevers. No chest pain or SOB. 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       Physical Exam   Triage Vital Signs: ED Triage Vitals  Enc Vitals Group     BP 05/08/23 1620 (!) 135/99     Pulse Rate 05/08/23 1620 96     Resp 05/08/23 1620 20     Temp 05/08/23 1620 99.9 F (37.7 C)     Temp Source 05/08/23 1620 Oral     SpO2 05/08/23 1620 97 %     Weight 05/08/23 1622 234 lb (106.1 kg)     Height --      Head Circumference --      Peak Flow --      Pain Score 05/08/23 1622 0     Pain Loc --      Pain Edu? --      Excl. in GC? --     Most recent vital signs: Vitals:   05/08/23 1620  BP: (!) 135/99  Pulse: 96  Resp: 20  Temp: 99.9 F (37.7 C)  SpO2: 97%   General: Awake, alert, oriented. CV:  Good peripheral perfusion. Regular rate and rhythm. Resp:  Normal effort. Lungs clear. Abd:  No distention.    ED Results / Procedures / Treatments   Labs (all labs ordered are listed, but only abnormal results are displayed) Labs Reviewed  COMPREHENSIVE METABOLIC PANEL - Abnormal; Notable for the following components:      Result Value   Sodium 134 (*)    Glucose, Bld 119 (*)    Calcium 8.5 (*)    AST 141 (*)    ALT 82 (*)    Total Bilirubin 1.8 (*)    All other components within normal limits  URINALYSIS, ROUTINE W REFLEX MICROSCOPIC - Abnormal; Notable for the following components:   Color, Urine YELLOW (*)     APPearance CLEAR (*)    All other components within normal limits  CBC WITH DIFFERENTIAL/PLATELET  D-DIMER, QUANTITATIVE     EKG  I, Phineas Semen, attending physician, personally viewed and interpreted this EKG  EKG Time: 1613 Rate: 91 Rhythm: normal sinus rhythm Axis: normal Intervals: qtc 423 QRS: narrow, q waves V1 ST changes: no st elevation Impression: abnormal ekg   RADIOLOGY None    PROCEDURES:  Critical Care performed: No    MEDICATIONS ORDERED IN ED: Medications - No data to display   IMPRESSION / MDM / ASSESSMENT AND PLAN / ED COURSE  I reviewed the triage vital signs and the nursing notes.                              Differential diagnosis includes, but is not limited to, dehydration, anemia, blood clot  Patient's presentation is most consistent with acute presentation with potential threat to life or bodily  function.   Patient presented to the emergency department today because of concern for fatigue and weakness after recent colonoscopy. Blood work without concerning anemia or leukocytosis. Slightly hyponatremia. Did check a d-dimer given history of blood clots and that he held blood thinner around the time of the colonoscopy. This was fortunately negative. Patient did feel some improvement after IV fluids. At this time do think dehydration likely. Will plan on discharging. Discussed return precautions.    FINAL CLINICAL IMPRESSION(S) / ED DIAGNOSES   Final diagnoses:  Weakness  Dehydration     Note:  This document was prepared using Dragon voice recognition software and may include unintentional dictation errors.    Phineas Semen, MD 05/08/23 [phone]

## 2023-05-24 ENCOUNTER — Telehealth: Payer: Self-pay | Admitting: Gastroenterology

## 2023-05-24 NOTE — Telephone Encounter (Signed)
Called and left a detailed message on Danny Lowe's vmail at the [place] office regarding Dr. Elesa Hacker advice.

## 2023-05-24 NOTE — Telephone Encounter (Signed)
Urgent referral in [facility] for EMR for resection.  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 biopsy taken show results consistent with bleeding source.  Records in [facility] under media tab.  Please review and advise scheduling.  Thanks

## 2023-10-01 ENCOUNTER — Other Ambulatory Visit (HOSPITAL_COMMUNITY): Payer: Self-pay | Admitting: Interventional Radiology

## 2023-10-01 DIAGNOSIS — Z95828 Presence of other vascular implants and grafts: Secondary | ICD-10-CM
